# Patient Record
Sex: Male | Born: 1945 | Race: White | Hispanic: No | Marital: Married | Smoking: Former smoker
Health system: Southern US, Community
[De-identification: ages and names within clinical notes are randomized; demographics above are authoritative.]

## PROBLEM LIST (undated history)

## (undated) DIAGNOSIS — E7800 Pure hypercholesterolemia, unspecified: Secondary | ICD-10-CM

## (undated) DIAGNOSIS — I10 Essential (primary) hypertension: Secondary | ICD-10-CM

## (undated) DIAGNOSIS — F17200 Nicotine dependence, unspecified, uncomplicated: Secondary | ICD-10-CM

## (undated) DIAGNOSIS — J449 Chronic obstructive pulmonary disease, unspecified: Secondary | ICD-10-CM

## (undated) DIAGNOSIS — C4492 Squamous cell carcinoma of skin, unspecified: Secondary | ICD-10-CM

## (undated) DIAGNOSIS — I2510 Atherosclerotic heart disease of native coronary artery without angina pectoris: Secondary | ICD-10-CM

## (undated) HISTORY — DX: Atherosclerotic heart disease of native coronary artery without angina pectoris: I25.10

---
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
## 2000-08-04 ENCOUNTER — Ambulatory Visit (HOSPITAL_COMMUNITY): Admission: RE | Admit: 2000-08-04 | Discharge: 2000-08-04 | Payer: Self-pay | Admitting: Pulmonary Disease

## 2004-02-11 ENCOUNTER — Ambulatory Visit: Payer: Self-pay | Admitting: Internal Medicine

## 2004-02-13 ENCOUNTER — Ambulatory Visit (HOSPITAL_COMMUNITY): Admission: RE | Admit: 2004-02-13 | Discharge: 2004-02-13 | Payer: Self-pay | Admitting: Internal Medicine

## 2004-02-13 HISTORY — PX: COLONOSCOPY: SHX174

## 2006-05-13 ENCOUNTER — Ambulatory Visit (HOSPITAL_COMMUNITY): Admission: RE | Admit: 2006-05-13 | Discharge: 2006-05-13 | Payer: Self-pay | Admitting: Pulmonary Disease

## 2007-05-25 ENCOUNTER — Ambulatory Visit (HOSPITAL_COMMUNITY): Admission: RE | Admit: 2007-05-25 | Discharge: 2007-05-25 | Payer: Self-pay | Admitting: Pulmonary Disease

## 2007-06-02 ENCOUNTER — Ambulatory Visit (HOSPITAL_COMMUNITY): Admission: RE | Admit: 2007-06-02 | Discharge: 2007-06-02 | Payer: Self-pay | Admitting: Certified Registered"

## 2009-07-10 ENCOUNTER — Ambulatory Visit (HOSPITAL_COMMUNITY): Admission: RE | Admit: 2009-07-10 | Discharge: 2009-07-10 | Payer: Self-pay | Admitting: Pulmonary Disease

## 2010-08-29 NOTE — Op Note (Signed)
NAME:  Thomas Mosley, Thomas Mosley                ACCOUNT NO.:  [id number]   MEDICAL RECORD NO.:  [id number]          PATIENT TYPE:  AMB   LOCATION:  DAY                           FACILITY:  [facility]   PHYSICIAN:  R. Roetta Sessions, M.D. DATE OF BIRTH:  1945/12/05   DATE OF PROCEDURE:  02/13/2004  DATE OF DISCHARGE:                                 OPERATIVE REPORT   PROCEDURE:  Colonoscopy and snare polypectomy.   INDICATIONS FOR PROCEDURE:  The patient is a 65 year old gentleman devoid of  any GI tract symptoms, referred at the courtesy of Dr. Juanetta Gosling for  colorectal cancer screening. No prior colonoscopy. No family history of  colorectal neoplasia. Colonoscopy is now being done as a screening maneuver.  This approach has been discussed with the patient at length. Potential  risks, benefits, and alternatives have been reviewed and questions answered.  He is agreeable. Please see documentation in the medical note for more  information.   PROCEDURE NOTE:  O2 saturation, blood pressure, pulse, and respirations  monitored throughout the entirety of the procedure. Conscious sedation with  Versed 3 mg IV and Demerol 75 mg IV in divided doses.   INSTRUMENT:  Olympus video chip system.   FINDINGS:  Digital rectal examination revealed no abnormalities.   ENDOSCOPIC FINDINGS:  Prep was good.   Rectum:  Examination of the rectal mucosa including retroflexed view of anal  verge revealed no abnormalities.   Colon:  Colonic mucosa was surveyed from the rectosigmoid junction through  the left, transverse, and right colon to area of the appendiceal orifice,  ileocecal valve, and cecum. These structures were well seen and photographed  for the record. Olympus video scope was slowly withdrawn, and all previously  mentioned mucosal surfaces were again seen. The patient has a 1-cm _angry-  appeaing  pedunculated polyp at 30 cm. It was removed with snare cautery,  and it was recovered with a scope. Remainder of  colonic mucosa appeared  normal. The patient tolerated the procedure well and was reactive to  endoscopy.   IMPRESSION:  1.  Normal rectum.  2.  Pedunculated polyp at 30 cm resected with a snare. Remainder of colonic      mucosa appeared normal.   RECOMMENDATIONS:  1.  No aspirin or arthritis medications for 10 days.  2.  Follow up on pathology.  3.  Further recommendations to follow.     Otelia Sergeant   RMR/MEDQ  D:  02/13/2004  T:  02/13/2004  Job:  [id number]   cc:   Ramon Dredge L. Juanetta Gosling, M.D.  [address]  [place]  [place] [address]  Fax: [phone]

## 2011-01-02 LAB — BASIC METABOLIC PANEL
BUN: 19
CO2: 33 — ABNORMAL HIGH
Calcium: 9.6
Chloride: 100
Creatinine, Ser: 0.85
GFR calc Af Amer: >60
GFR calc non Af Amer: >60
Glucose, Bld: 86
Potassium: 5
Sodium: 140

## 2011-01-02 LAB — HEMOGLOBIN AND HEMATOCRIT, BLOOD
HCT: 41.1
Hemoglobin: 14.6

## 2011-01-27 ENCOUNTER — Encounter (HOSPITAL_COMMUNITY): Payer: Self-pay

## 2011-01-27 ENCOUNTER — Other Ambulatory Visit: Payer: Self-pay

## 2011-01-27 ENCOUNTER — Encounter (HOSPITAL_COMMUNITY)
Admission: RE | Admit: 2011-01-27 | Discharge: 2011-01-27 | Disposition: A | Discharge status: Home / Self Care | Payer: 59 | Source: Ambulatory Visit | Attending: Ophthalmology | Admitting: Ophthalmology

## 2011-01-27 HISTORY — DX: Essential (primary) hypertension: I10

## 2011-01-27 HISTORY — DX: Chronic obstructive pulmonary disease, unspecified: J44.9

## 2011-01-27 HISTORY — DX: Pure hypercholesterolemia, unspecified: E78.00

## 2011-01-27 LAB — CBC
HCT: 42.3 % (ref 39.0–52.0)
Hemoglobin: 14.2 g/dL (ref 13.0–17.0)
MCHC: 33.6 g/dL (ref 30.0–36.0)
MCV: 92.2 fL (ref 78.0–100.0)
RDW: 13.3 % (ref 11.5–15.5)

## 2011-01-27 LAB — BASIC METABOLIC PANEL
BUN: 18 mg/dL (ref 6–23)
Chloride: 101 mEq/L (ref 96–112)
Creatinine, Ser: 0.8 mg/dL (ref 0.50–1.35)
GFR calc Af Amer: >90 mL/min (ref >90)
Glucose, Bld: 96 mg/dL (ref 70–99)
Potassium: 4.9 mEq/L (ref 3.5–5.1)

## 2011-01-27 NOTE — Patient Instructions (Addendum)
20 Thomas Mosley  01/27/2011   Your procedure is scheduled on:  Monday, 02/02/11  Report to [facility] at 12:00 PM.  Call this number if you have problems the morning of surgery: [phone]   Remember:   Do not eat food:After Midnight.  Do not drink clear liquids: After Midnight.  Take these medicines the morning of surgery with A SIP OF WATER: cardiazem    Do not wear jewelry, make-up or nail polish.  Do not wear lotions, powders, or perfumes. You may wear deodorant.  Do not bring valuables to the hospital.  Contacts, dentures or bridgework may not be worn into surgery.    Patients discharged the day of surgery will not be allowed to drive home.  Name and phone number of your driver: driver  Special Instructions: Use eye drops as instructed.   Please read over the following fact sheets that you were given: Anesthesia Post-op Instructions   Monitored Anesthesia Care (MAC)  MAC stands for monitored anesthesia care. MAC usually means a tube is not put in your trachea (windpipe). MAC may also be called moderate sedation. MAC usually involves giving intravenous anesthetic drugs, oxygen, watching vital signs and standard patient monitoring procedures similar to those used during a general anesthetic. MAC can be done without going to the operating room. MAC is typically used for small procedures that cannot be done with only local anesthesia. MAC usually means lower doses of anesthetic drugs. The recovery period tends to be shorter. The drugs used cause a lower level of awareness. This means you are partially awake and your reflexes are intact. You may hear what is being said and feel some pressure, but should not feel pain. The drugs used may affect your ability to remember the procedure. If you have depressed consciousness and lose some protective reflexes, this is called deep sedation. If you become unconscious and fall completely asleep, this is general anesthesia. In both deep sedation and  general anesthesia, the caregivers must make sure that your airway remains open. During MAC, the sedation-trained caregivers will:  Give medications which may include:   Sedatives.   Analgesics.   Hypnotics.   Other medications which are needed to keep you comfortable, safe and secure.   Give local anesthetic to numb the procedural site.   Monitor your level of consciousness.   Monitor your blood pressure.   Monitor your heart rate and rhythm.   Monitor your respirations and oxygen levels.   Monitor your airway.   Monitor your level of pain.   Evaluate and treat problems which may occur.  Some of this information is from the [employer]. Document Released: 12/24/2004 Document Re-Released: 04/21/2009 [facility] Patient Information 2011 [place], [place].

## 2011-02-02 ENCOUNTER — Encounter (HOSPITAL_COMMUNITY)
Admission: RE | Disposition: A | Discharge status: Home / Self Care | Payer: Self-pay | Source: Ambulatory Visit | Attending: Ophthalmology

## 2011-02-02 ENCOUNTER — Encounter (HOSPITAL_COMMUNITY): Payer: Self-pay

## 2011-02-02 ENCOUNTER — Ambulatory Visit (HOSPITAL_COMMUNITY): Payer: 59 | Admitting: Anesthesiology

## 2011-02-02 ENCOUNTER — Encounter (HOSPITAL_COMMUNITY): Payer: Self-pay | Admitting: Anesthesiology

## 2011-02-02 ENCOUNTER — Ambulatory Visit (HOSPITAL_COMMUNITY)
Admission: RE | Admit: 2011-02-02 | Discharge: 2011-02-02 | Disposition: A | Discharge status: Home / Self Care | Payer: 59 | Source: Ambulatory Visit | Attending: Ophthalmology | Admitting: Ophthalmology

## 2011-02-02 DIAGNOSIS — E7800 Pure hypercholesterolemia, unspecified: Secondary | ICD-10-CM | POA: Insufficient documentation

## 2011-02-02 DIAGNOSIS — I10 Essential (primary) hypertension: Secondary | ICD-10-CM | POA: Insufficient documentation

## 2011-02-02 DIAGNOSIS — H2589 Other age-related cataract: Secondary | ICD-10-CM | POA: Insufficient documentation

## 2011-02-02 DIAGNOSIS — Z79899 Other long term (current) drug therapy: Secondary | ICD-10-CM | POA: Insufficient documentation

## 2011-02-02 DIAGNOSIS — J4489 Other specified chronic obstructive pulmonary disease: Secondary | ICD-10-CM | POA: Insufficient documentation

## 2011-02-02 DIAGNOSIS — Z01812 Encounter for preprocedural laboratory examination: Secondary | ICD-10-CM | POA: Insufficient documentation

## 2011-02-02 DIAGNOSIS — Z01810 Encounter for preprocedural cardiovascular examination: Secondary | ICD-10-CM | POA: Insufficient documentation

## 2011-02-02 DIAGNOSIS — J449 Chronic obstructive pulmonary disease, unspecified: Secondary | ICD-10-CM | POA: Insufficient documentation

## 2011-02-02 HISTORY — PX: CATARACT EXTRACTION W/PHACO: SHX586

## 2011-02-02 SURGERY — PHACOEMULSIFICATION, CATARACT, WITH IOL INSERTION
Anesthesia: Monitor Anesthesia Care | Site: Eye | Laterality: Left | Wound class: Clean

## 2011-02-02 MED ORDER — NEOMYCIN-POLYMYXIN-DEXAMETH 0.1 % OP OINT
TOPICAL_OINTMENT | OPHTHALMIC | Status: DC | PRN
  Administered 2011-02-02: 1 via OPHTHALMIC

## 2011-02-02 MED ORDER — LIDOCAINE HCL 3.5 % OP GEL: 1.0000 "application " | Freq: Once | OPHTHALMIC | Status: DC

## 2011-02-02 MED ORDER — TETRACAINE HCL 0.5 % OP SOLN
1.0000 [drp] | OPHTHALMIC | Status: AC
  Administered 2011-02-02 (×3): 1 [drp] via OPHTHALMIC

## 2011-02-02 MED ORDER — MIDAZOLAM HCL 2 MG/2ML IJ SOLN
1.0000 mg | INTRAMUSCULAR | Status: DC | PRN
  Administered 2011-02-02: 2 mg via INTRAVENOUS

## 2011-02-02 MED ORDER — LIDOCAINE HCL 3.5 % OP GEL
OPHTHALMIC | Status: AC
  Filled 2011-02-02: qty 5

## 2011-02-02 MED ORDER — LIDOCAINE HCL (PF) 1 % IJ SOLN
INTRAMUSCULAR | Status: DC | PRN
  Administered 2011-02-02: .6 mL

## 2011-02-02 MED ORDER — EPINEPHRINE HCL 1 MG/ML IJ SOLN
INTRAMUSCULAR | Status: AC
  Filled 2011-02-02: qty 1

## 2011-02-02 MED ORDER — LIDOCAINE HCL (PF) 1 % IJ SOLN
INTRAMUSCULAR | Status: AC
  Filled 2011-02-02: qty 2

## 2011-02-02 MED ORDER — CYCLOPENTOLATE-PHENYLEPHRINE 0.2-1 % OP SOLN
1.0000 [drp] | OPHTHALMIC | Status: AC
  Administered 2011-02-02 (×3): 1 [drp] via OPHTHALMIC

## 2011-02-02 MED ORDER — BSS IO SOLN
INTRAOCULAR | Status: DC | PRN
  Administered 2011-02-02: 15 mL via OPHTHALMIC

## 2011-02-02 MED ORDER — PHENYLEPHRINE HCL 2.5 % OP SOLN
OPHTHALMIC | Status: AC
  Administered 2011-02-02: 1 [drp] via OPHTHALMIC
  Filled 2011-02-02: qty 2

## 2011-02-02 MED ORDER — TETRACAINE HCL 0.5 % OP SOLN
OPHTHALMIC | Status: AC
  Administered 2011-02-02: 1 [drp] via OPHTHALMIC
  Filled 2011-02-02: qty 2

## 2011-02-02 MED ORDER — LIDOCAINE 3.5 % OP GEL OPTIME - NO CHARGE
OPHTHALMIC | Status: DC | PRN
  Administered 2011-02-02: 1 [drp] via OPHTHALMIC

## 2011-02-02 MED ORDER — LACTATED RINGERS IV SOLN
INTRAVENOUS | Status: DC
  Administered 2011-02-02: 14:00:00 via INTRAVENOUS

## 2011-02-02 MED ORDER — PROVISC 10 MG/ML IO SOLN
INTRAOCULAR | Status: DC | PRN
  Administered 2011-02-02: 8.5 mg via OPHTHALMIC

## 2011-02-02 MED ORDER — POVIDONE-IODINE 5 % OP SOLN
OPHTHALMIC | Status: DC | PRN
  Administered 2011-02-02: 1 via OPHTHALMIC

## 2011-02-02 MED ORDER — NEOMYCIN-POLYMYXIN-DEXAMETH 3.5-10000-0.1 OP OINT
TOPICAL_OINTMENT | OPHTHALMIC | Status: AC
  Filled 2011-02-02: qty 3.5

## 2011-02-02 MED ORDER — PHENYLEPHRINE HCL 2.5 % OP SOLN
1.0000 [drp] | OPHTHALMIC | Status: AC
  Administered 2011-02-02 (×3): 1 [drp] via OPHTHALMIC

## 2011-02-02 MED ORDER — BSS IO SOLN
INTRAOCULAR | Status: DC | PRN
  Administered 2011-02-02: 14:00:00

## 2011-02-02 MED ORDER — MIDAZOLAM HCL 2 MG/2ML IJ SOLN
INTRAMUSCULAR | Status: AC
  Administered 2011-02-02: 2 mg via INTRAVENOUS
  Filled 2011-02-02: qty 2

## 2011-02-02 MED ORDER — CYCLOPENTOLATE-PHENYLEPHRINE 0.2-1 % OP SOLN
OPHTHALMIC | Status: AC
  Administered 2011-02-02: 1 [drp] via OPHTHALMIC
  Filled 2011-02-02: qty 2

## 2011-02-02 SURGICAL SUPPLY — 34 items
CAPSULAR TENSION RING-AMO (OPHTHALMIC RELATED) IMPLANT
CLOTH BEACON ORANGE TIMEOUT ST (SAFETY) ×1 IMPLANT
DUOVISC SYSTEM (INTRAOCULAR LENS)
EYE SHIELD UNIVERSAL CLEAR (GAUZE/BANDAGES/DRESSINGS) ×1 IMPLANT
GLOVE BIO SURGEON STRL SZ 6.5 (GLOVE) IMPLANT
GLOVE BIOGEL PI IND STRL 6.5 (GLOVE) IMPLANT
GLOVE BIOGEL PI IND STRL 7.0 (GLOVE) IMPLANT
GLOVE BIOGEL PI IND STRL 7.5 (GLOVE) IMPLANT
GLOVE BIOGEL PI INDICATOR 6.5 (GLOVE)
GLOVE BIOGEL PI INDICATOR 7.0 (GLOVE) ×1
GLOVE BIOGEL PI INDICATOR 7.5 (GLOVE)
GLOVE ECLIPSE 6.5 STRL STRAW (GLOVE) IMPLANT
GLOVE ECLIPSE 7.0 STRL STRAW (GLOVE) IMPLANT
GLOVE ECLIPSE 7.5 STRL STRAW (GLOVE) IMPLANT
GLOVE EXAM NITRILE LRG STRL (GLOVE) ×1 IMPLANT
GLOVE EXAM NITRILE MD LF STRL (GLOVE) IMPLANT
GLOVE SKINSENSE NS SZ6.5 (GLOVE)
GLOVE SKINSENSE NS SZ7.0 (GLOVE)
GLOVE SKINSENSE STRL SZ6.5 (GLOVE) IMPLANT
GLOVE SKINSENSE STRL SZ7.0 (GLOVE) IMPLANT
KIT VITRECTOMY (OPHTHALMIC RELATED) IMPLANT
PAD ARMBOARD 7.5X6 YLW CONV (MISCELLANEOUS) ×1 IMPLANT
PROC W NO LENS (INTRAOCULAR LENS)
PROC W SPEC LENS (INTRAOCULAR LENS)
PROCESS W NO LENS (INTRAOCULAR LENS) IMPLANT
PROCESS W SPEC LENS (INTRAOCULAR LENS) IMPLANT
RING MALYGIN (MISCELLANEOUS) IMPLANT
SIGHTPATH CAT PROC W REG LENS (Ophthalmic Related) ×2 IMPLANT
SYR TB 1ML LL NO SAFETY (SYRINGE) ×1 IMPLANT
SYSTEM DUOVISC (INTRAOCULAR LENS) IMPLANT
TAPE SURG TRANSPORE 1 IN (GAUZE/BANDAGES/DRESSINGS) IMPLANT
TAPE SURGICAL TRANSPORE 1 IN (GAUZE/BANDAGES/DRESSINGS) ×1
VISCOELASTIC ADDITIONAL (OPHTHALMIC RELATED) IMPLANT
WATER STERILE IRR 250ML POUR (IV SOLUTION) ×1 IMPLANT

## 2011-02-02 NOTE — H&P (Signed)
I have reviewed the H&P, the patient was re-examined, and I have identified no interval changes in medical condition and plan of care since the history and physical of record  

## 2011-02-02 NOTE — Anesthesia Preprocedure Evaluation (Signed)
Anesthesia Evaluation  Patient identified by MRN, date of birth, ID band Patient awake  General Assessment Comment  Reviewed: Allergy & Precautions, H&P , NPO status , Patient's Chart, lab work & pertinent test results  History of Anesthesia Complications Negative for: history of anesthetic complications  Airway Mallampati: II      Dental  (+) Partial Upper and Partial Lower   Pulmonary (+) shortness of breath and With exertion COPD   Pulmonary exam normal       Cardiovascular hypertension, Pt. on medications Regular Normal    Neuro/Psych    GI/Hepatic   Endo/Other    Renal/GU      Musculoskeletal   Abdominal   Peds  Hematology   Anesthesia Other Findings   Reproductive/Obstetrics                           Anesthesia Physical Anesthesia Plan  ASA: III  Anesthesia Plan: MAC   Post-op Pain Management:    Induction:   Airway Management Planned: Nasal Cannula  Additional Equipment:   Intra-op Plan:   Post-operative Plan:   Informed Consent: I have reviewed the patients History and Physical, chart, labs and discussed the procedure including the risks, benefits and alternatives for the proposed anesthesia with the patient or authorized representative who has indicated his/her understanding and acceptance.     Plan Discussed with:   Anesthesia Plan Comments:         Anesthesia Quick Evaluation

## 2011-02-02 NOTE — Transfer of Care (Signed)
Immediate Anesthesia Transfer of Care Note  Patient: Thomas Mosley  Procedure(s) Performed:  CATARACT EXTRACTION PHACO AND INTRAOCULAR LENS PLACEMENT (IOC) - CDE 12.33  Patient Location: PACU and Short Stay  Anesthesia Type: MAC  Level of Consciousness: awake  Airway & Oxygen Therapy: Patient Spontanous Breathing  Post-op Assessment: Report given to PACU RN  Post vital signs: Reviewed and stable  Complications: No apparent anesthesia complications

## 2011-02-02 NOTE — Anesthesia Postprocedure Evaluation (Signed)
  Anesthesia Post-op Note  Patient: Thomas Mosley  Procedure(s) Performed:  CATARACT EXTRACTION PHACO AND INTRAOCULAR LENS PLACEMENT (IOC) - CDE 12.33  Patient Location: PACU and Short Stay  Anesthesia Type: MAC  Level of Consciousness: awake  Airway and Oxygen Therapy: Patient Spontanous Breathing  Post-op Pain: none  Post-op Assessment: Post-op Vital signs reviewed  Post-op Vital Signs: Reviewed and stable  Complications: No apparent anesthesia complications

## 2011-02-02 NOTE — Brief Op Note (Signed)
Pre-Op Dx: Cataract OS Post-Op Dx: Cataract OS Surgeon: Kathlee Barnhardt Anesthesia: Topical with MAC Implant: Alcon Acrysof [id number] Specimen: None Complications: None 

## 2011-02-03 NOTE — Op Note (Signed)
NAME:  CATALDO, COSGRIFF NO.:  [id number]  MEDICAL RECORD NO.:  [id number]  LOCATION:  [facility]                          FACILITY:  [facility]  PHYSICIAN:  Susanne Greenhouse, MD       DATE OF BIRTH:  03/04/1946  DATE OF PROCEDURE:  02/02/2011 DATE OF DISCHARGE:  02/02/2011                              OPERATIVE REPORT   PREOPERATIVE DIAGNOSIS:  Combined cataract, left eye.  Diagnosis code 366.19.  POSTOPERATIVE DIAGNOSIS:  Combined cataract, left eye.  Diagnosis code 366.19.  SURGEON:  Bonne Dolores. Khye Hochstetler, MD  ANESTHESIA:  Topical with monitored anesthesia care.  DESCRIPTION OF OPERATION:  In the preoperative holding area, dilating drops and viscous lidocaine were placed into the left eye.  The patient was then brought to the operating room where he was prepped and draped. Beginning with a 75 blade, a paracentesis port was made at the surgeon's 2 o'clock position.  The anterior chamber was then filled with a 1% nonpreserved lidocaine solution.  This was followed by instilling Provisc into the anterior chamber through the paracentesis port.  A 2.4 mm keratome blade was then used to make a clear corneal incision at the temporal limbus.  A bent cystotome needle was used to create a continuous tear capsulotomy.  Hydrodissection was performed with balanced salt solution on a fine cannula.  Lens nucleus was then removed using phacoemulsification in a quadrant cracking technique.  Residual cortex was removed with irrigation and aspiration.  The capsular bag and anterior chamber were refilled with Provisc, and the poster chamber interocular was placed in the capsular bag without difficulty using its lens injecting system.  The Provisc was then removed from the capsular bag and anterior chamber with irrigation and aspiration.  Stromal hydration of the main incision and paracentesis ports was performed with balanced salt solution on a fine cannula.  The wounds were tested for leak which  were negative.  The patient tolerated procedure well.  There were no operative complications, and he was returned to recovery area in satisfactory condition.  There were no surgical specimens.  Prosthetic device used is a Alcon AcrySof posterior chamber lens, model SN60WF, power of 22.0, serial number is [id number].          ______________________________ Susanne Greenhouse, MD     KEH/MEDQ  D:  02/02/2011  T:  02/03/2011  Job:  [id number]

## 2011-02-06 ENCOUNTER — Encounter (HOSPITAL_COMMUNITY): Payer: Self-pay | Admitting: Ophthalmology

## 2011-06-12 DIAGNOSIS — C4492 Squamous cell carcinoma of skin, unspecified: Secondary | ICD-10-CM

## 2011-06-12 HISTORY — DX: Squamous cell carcinoma of skin, unspecified: C44.92

## 2012-10-13 ENCOUNTER — Encounter (INDEPENDENT_AMBULATORY_CARE_PROVIDER_SITE_OTHER): Payer: Self-pay | Admitting: *Deleted

## 2012-10-27 ENCOUNTER — Encounter: Payer: Self-pay | Admitting: Internal Medicine

## 2012-10-31 ENCOUNTER — Encounter: Payer: Self-pay | Admitting: Gastroenterology

## 2012-10-31 ENCOUNTER — Ambulatory Visit (INDEPENDENT_AMBULATORY_CARE_PROVIDER_SITE_OTHER): Payer: Medicare Other | Admitting: Gastroenterology

## 2012-10-31 VITALS — BP 175/97 | HR 75 | Temp 98.4°F | Ht 73.0 in | Wt 156.6 lb

## 2012-10-31 DIAGNOSIS — Z86010 Personal history of colonic polyps: Secondary | ICD-10-CM

## 2012-10-31 NOTE — Progress Notes (Signed)
Primary Care Physician:  Fredirick Maudlin, MD Primary Gastroenterologist:  Dr. Jena Gauss   Chief Complaint  Patient presents with  . Colonoscopy    HPI:   Thomas Mosley presents today at the request of his PCP due to surveillance colonoscopy. Last procedure in 2005 with pedunculated polyp removed; path unknown at time of visit. No abdominal pain, no rectal bleeding. No constipation or diarrhea; denies any changes in bowel habits. Denies any weight loss or lack of appetite. Denies reflux or dysphagia.   Past Medical History  Diagnosis Date  . Hypertension   . Hypercholesterolemia   . COPD (chronic obstructive pulmonary disease)     Past Surgical History  Procedure Laterality Date  . Cataract extraction w/phaco  02/02/2011    Procedure: CATARACT EXTRACTION PHACO AND INTRAOCULAR LENS PLACEMENT (IOC);  Surgeon: Gemma Payor;  Location: [facility];  Service: Ophthalmology;  Laterality: Left;  CDE 12.33  . Colonoscopy  02/13/2004    RUE:[id number] polyp at 30 cm resected/normal rectum    Current Outpatient Prescriptions  Medication Sig Dispense Refill  . diltiazem (CARDIZEM CD) 180 MG 24 hr capsule Take 180 mg by mouth daily.        . pravastatin (PRAVACHOL) 40 MG tablet Take 80 mg by mouth daily.         No current facility-administered medications for this visit.    Allergies as of 10/31/2012  . (No Known Allergies)    Family History  Problem Relation Age of Onset  . Anesthesia problems Neg Hx   . Hypotension Neg Hx   . Malignant hyperthermia Neg Hx   . Pseudochol deficiency Neg Hx   . Colon cancer Neg Hx     History   Social History  . Marital Status: Married    Spouse Name: N/A    Number of Children: N/A  . Years of Education: N/A   Occupational History  . Not on file.   Social History Main Topics  . Smoking status: Current Every Day Smoker -- 40 years    Types: Cigarettes  . Smokeless tobacco: Not on file     Comment: smokes 1-2 cigarettes per day   . Alcohol Use:  0.0 oz/week     Comment: glass of wine about 3 days a week   . Drug Use: No  . Sexually Active: Yes   Other Topics Concern  . Not on file   Social History Narrative  . No narrative on file    Review of Systems: Gen: see HPI CV: Denies chest pain, heart palpitations, peripheral edema, syncope.  Resp: Denies shortness of breath at rest or with exertion. Denies wheezing or cough.  GI: see HPI  GU : Denies urinary burning, urinary frequency, urinary hesitancy MS: Denies joint pain, muscle weakness, cramps, or limitation of movement.  Derm: Denies rash, itching, dry skin Psych: Denies depression, anxiety, memory loss, and confusion Heme: Denies bruising, bleeding, and enlarged lymph nodes.  Physical Exam: BP 175/97  Pulse 75  Temp(Src) 98.4 F (36.9 C) (Oral)  Ht 6\' 1"  (1.854 m)  Wt 156 lb 9.6 oz (71.033 kg)  BMI 20.67 kg/m2 General:   Alert and oriented. Pleasant and cooperative. Well-nourished and well-developed.  Head:  Normocephalic and atraumatic. Eyes:  Without icterus, sclera clear and conjunctiva pink.  Ears:  Normal auditory acuity. Nose:  No deformity, discharge,  or lesions. Mouth:  No deformity or lesions, oral mucosa pink.  Neck:  Supple, without mass or thyromegaly. Lungs:  Clear to auscultation bilaterally.  No wheezes, rales, or rhonchi. No distress.  Heart:  S1, S2 present without murmurs appreciated.  Abdomen:  +BS, soft, non-tender and non-distended. No HSM noted. No guarding or rebound. No masses appreciated.  Rectal:  Deferred  Msk:  Symmetrical without gross deformities. Normal posture. Extremities:  Without clubbing or edema. Neurologic:  Alert and  oriented x4;  grossly normal neurologically. Skin:  Intact without significant lesions or rashes. Cervical Nodes:  No significant cervical adenopathy. Psych:  Alert and cooperative. Normal mood and affect.

## 2012-10-31 NOTE — Patient Instructions (Addendum)
We have requested the pathology reports from your last colonoscopy. You may not need a colonoscopy until next year. We will let you know as soon as we can.  We will be in touch shortly.

## 2012-11-01 DIAGNOSIS — Z860100 Personal history of colon polyps, unspecified: Secondary | ICD-10-CM | POA: Insufficient documentation

## 2012-11-01 NOTE — Progress Notes (Signed)
Cc PCP 

## 2012-11-01 NOTE — Assessment & Plan Note (Signed)
67 year old male with personal history of a colon polyp noted on last TCS in 2005. At time of visit, path report unavailable. I have been unable to retrieve path report from medical records thus far. He has no lower or upper GI issues currently. No FH of colon cancer.   As patient does have a personal history of polyps, it would be prudent to pursue a colonoscopy now instead of waiting a full 10 years; if polyp was adenomatous, he would be several years overdue. It is reassuring he does not have any concerning symptoms currently.   Proceed with TCS with Dr. Jena Gauss in near future: the risks, benefits, and alternatives have been discussed with the patient in detail. The patient states understanding and desires to proceed.

## 2012-11-01 NOTE — Progress Notes (Signed)
Thomas Mosley, I have been unable to retrieve path notes from [facility] regarding polyp in 2005. To be thorough, let's go ahead and schedule a colonoscopy with Dr. Jena Gauss in the near future. Patient is aware I was looking into this, and he is just waiting to hear from [place] on the next step.

## 2012-11-01 NOTE — Progress Notes (Signed)
I have LMOM for patient to return my call.  

## 2012-11-02 ENCOUNTER — Other Ambulatory Visit: Payer: Self-pay | Admitting: Internal Medicine

## 2012-11-02 DIAGNOSIS — Z86010 Personal history of colonic polyps: Secondary | ICD-10-CM

## 2012-11-02 MED ORDER — PEG 3350-KCL-NA BICARB-NACL 420 G PO SOLR: 4000.0000 mL | ORAL | Status: DC

## 2012-11-02 NOTE — Progress Notes (Signed)
I have patient scheduled for TCS w/RMR on Friday Aug 1st at 12:15 and I have mailed him instructions

## 2012-11-11 ENCOUNTER — Encounter (HOSPITAL_COMMUNITY): Payer: Self-pay

## 2012-11-14 ENCOUNTER — Encounter: Payer: Self-pay | Admitting: Gastroenterology

## 2012-11-14 NOTE — Progress Notes (Signed)
Finally received path reports from [facility]. 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  He has not had a colonoscopy since then. Very important that he keeps this surveillance colonoscopy; he is greatly overdue.

## 2012-11-18 ENCOUNTER — Ambulatory Visit: Payer: 59 | Admitting: Internal Medicine

## 2012-11-23 ENCOUNTER — Ambulatory Visit (HOSPITAL_COMMUNITY)
Admission: RE | Admit: 2012-11-23 | Discharge: 2012-11-23 | Disposition: A | Discharge status: Home / Self Care | Payer: Medicare Other | Source: Ambulatory Visit | Attending: Internal Medicine | Admitting: Internal Medicine

## 2012-11-23 ENCOUNTER — Encounter (HOSPITAL_COMMUNITY): Payer: Self-pay | Admitting: *Deleted

## 2012-11-23 ENCOUNTER — Encounter (HOSPITAL_COMMUNITY)
Admission: RE | Disposition: A | Discharge status: Home / Self Care | Payer: Self-pay | Source: Ambulatory Visit | Attending: Internal Medicine

## 2012-11-23 DIAGNOSIS — E7800 Pure hypercholesterolemia, unspecified: Secondary | ICD-10-CM | POA: Insufficient documentation

## 2012-11-23 DIAGNOSIS — Z860100 Personal history of colon polyps, unspecified: Secondary | ICD-10-CM | POA: Insufficient documentation

## 2012-11-23 DIAGNOSIS — F17200 Nicotine dependence, unspecified, uncomplicated: Secondary | ICD-10-CM | POA: Insufficient documentation

## 2012-11-23 DIAGNOSIS — J449 Chronic obstructive pulmonary disease, unspecified: Secondary | ICD-10-CM | POA: Insufficient documentation

## 2012-11-23 DIAGNOSIS — K648 Other hemorrhoids: Secondary | ICD-10-CM | POA: Insufficient documentation

## 2012-11-23 DIAGNOSIS — I10 Essential (primary) hypertension: Secondary | ICD-10-CM | POA: Insufficient documentation

## 2012-11-23 DIAGNOSIS — Z1211 Encounter for screening for malignant neoplasm of colon: Secondary | ICD-10-CM

## 2012-11-23 DIAGNOSIS — J4489 Other specified chronic obstructive pulmonary disease: Secondary | ICD-10-CM | POA: Insufficient documentation

## 2012-11-23 HISTORY — PX: COLONOSCOPY: SHX5424

## 2012-11-23 SURGERY — COLONOSCOPY
Anesthesia: Moderate Sedation

## 2012-11-23 MED ORDER — MIDAZOLAM HCL 5 MG/5ML IJ SOLN
INTRAMUSCULAR | Status: DC | PRN
  Administered 2012-11-23: 2 mg via INTRAVENOUS
  Administered 2012-11-23: 1 mg via INTRAVENOUS

## 2012-11-23 MED ORDER — ONDANSETRON HCL 4 MG/2ML IJ SOLN
INTRAMUSCULAR | Status: AC
  Filled 2012-11-23: qty 2

## 2012-11-23 MED ORDER — MEPERIDINE HCL 100 MG/ML IJ SOLN
INTRAMUSCULAR | Status: AC
  Filled 2012-11-23: qty 2

## 2012-11-23 MED ORDER — SODIUM CHLORIDE 0.9 % IV SOLN
INTRAVENOUS | Status: DC
  Administered 2012-11-23: 10:00:00 via INTRAVENOUS

## 2012-11-23 MED ORDER — STERILE WATER FOR IRRIGATION IR SOLN
Status: DC | PRN
  Administered 2012-11-23: 11:00:00

## 2012-11-23 MED ORDER — MEPERIDINE HCL 100 MG/ML IJ SOLN
INTRAMUSCULAR | Status: DC | PRN
  Administered 2012-11-23: 25 mg via INTRAVENOUS
  Administered 2012-11-23: 50 mg

## 2012-11-23 MED ORDER — MIDAZOLAM HCL 5 MG/5ML IJ SOLN
INTRAMUSCULAR | Status: AC
  Filled 2012-11-23: qty 10

## 2012-11-23 MED ORDER — ONDANSETRON HCL 4 MG/2ML IJ SOLN
INTRAMUSCULAR | Status: DC | PRN
  Administered 2012-11-23: 4 mg via INTRAVENOUS

## 2012-11-23 NOTE — H&P (View-Only) (Signed)
 Primary Care Physician:  HAWKINS,EDWARD L, MD Primary Gastroenterologist:  Dr. Rourk   Chief Complaint  Patient presents with  . Colonoscopy    HPI:   Thomas Mosley presents today at the request of his PCP due to surveillance colonoscopy. Last procedure in 2005 with pedunculated polyp removed; path unknown at time of visit. No abdominal pain, no rectal bleeding. No constipation or diarrhea; denies any changes in bowel habits. Denies any weight loss or lack of appetite. Denies reflux or dysphagia.   Past Medical History  Diagnosis Date  . Hypertension   . Hypercholesterolemia   . COPD (chronic obstructive pulmonary disease)     Past Surgical History  Procedure Laterality Date  . Cataract extraction w/phaco  02/02/2011    Procedure: CATARACT EXTRACTION PHACO AND INTRAOCULAR LENS PLACEMENT (IOC);  Surgeon: Kerry Hunt;  Location: [facility];  Service: Ophthalmology;  Laterality: Left;  CDE 12.33  . Colonoscopy  02/13/2004    RMR:Pedunculated polyp at 30 cm resected/normal rectum    Current Outpatient Prescriptions  Medication Sig Dispense Refill  . diltiazem (CARDIZEM CD) 180 MG 24 hr capsule Take 180 mg by mouth daily.        . pravastatin (PRAVACHOL) 40 MG tablet Take 80 mg by mouth daily.         No current facility-administered medications for this visit.    Allergies as of 10/31/2012  . (No Known Allergies)    Family History  Problem Relation Age of Onset  . Anesthesia problems Neg Hx   . Hypotension Neg Hx   . Malignant hyperthermia Neg Hx   . Pseudochol deficiency Neg Hx   . Colon cancer Neg Hx     History   Social History  . Marital Status: Married    Spouse Name: N/A    Number of Children: N/A  . Years of Education: N/A   Occupational History  . Not on file.   Social History Main Topics  . Smoking status: Current Every Day Smoker -- 40 years    Types: Cigarettes  . Smokeless tobacco: Not on file     Comment: smokes 1-2 cigarettes per day   . Alcohol Use:  0.0 oz/week     Comment: glass of wine about 3 days a week   . Drug Use: No  . Sexually Active: Yes   Other Topics Concern  . Not on file   Social History Narrative  . No narrative on file    Review of Systems: Gen: see HPI CV: Denies chest pain, heart palpitations, peripheral edema, syncope.  Resp: Denies shortness of breath at rest or with exertion. Denies wheezing or cough.  GI: see HPI  GU : Denies urinary burning, urinary frequency, urinary hesitancy MS: Denies joint pain, muscle weakness, cramps, or limitation of movement.  Derm: Denies rash, itching, dry skin Psych: Denies depression, anxiety, memory loss, and confusion Heme: Denies bruising, bleeding, and enlarged lymph nodes.  Physical Exam: BP 175/97  Pulse 75  Temp(Src) 98.4 F (36.9 C) (Oral)  Ht 6' 1" (1.854 m)  Wt 156 lb 9.6 oz (71.033 kg)  BMI 20.67 kg/m2 General:   Alert and oriented. Pleasant and cooperative. Well-nourished and well-developed.  Head:  Normocephalic and atraumatic. Eyes:  Without icterus, sclera clear and conjunctiva pink.  Ears:  Normal auditory acuity. Nose:  No deformity, discharge,  or lesions. Mouth:  No deformity or lesions, oral mucosa pink.  Neck:  Supple, without mass or thyromegaly. Lungs:  Clear to auscultation bilaterally.   No wheezes, rales, or rhonchi. No distress.  Heart:  S1, S2 present without murmurs appreciated.  Abdomen:  +BS, soft, non-tender and non-distended. No HSM noted. No guarding or rebound. No masses appreciated.  Rectal:  Deferred  Msk:  Symmetrical without gross deformities. Normal posture. Extremities:  Without clubbing or edema. Neurologic:  Alert and  oriented x4;  grossly normal neurologically. Skin:  Intact without significant lesions or rashes. Cervical Nodes:  No significant cervical adenopathy. Psych:  Alert and cooperative. Normal mood and affect.    

## 2012-11-23 NOTE — Op Note (Signed)
[facility] [address] [place], [address]   COLONOSCOPY PROCEDURE REPORT  PATIENT: Thomas Mosley, Thomas Mosley  MR#:         [id number] BIRTHDATE: [birth year] , 66  yrs. old GENDER: Male ENDOSCOPIST: R.  Roetta Sessions, MD FACP FACG REFERRED BY:  Kari Baars, M.D. PROCEDURE DATE:  11/23/2012 PROCEDURE:     Surveillance ileocolonoscopy  INDICATIONS: history of high-grade adenoma  INFORMED CONSENT:  The risks, benefits, alternatives and imponderables including but not limited to bleeding, perforation as well as the possibility of a missed lesion have been reviewed.  The potential for biopsy, lesion removal, etc. have also been discussed.  Questions have been answered.  All parties agreeable. Please see the history and physical in the medical record for more information.  MEDICATIONS: Versed 3 mg IV and Demerol 75 mg IV. Zofran 4 mg IV  DESCRIPTION OF PROCEDURE:  After a digital rectal exam was performed, the EC-[id number] ([id number])  colonoscope was advanced from the anus through the rectum and colon to the area of the cecum, ileocecal valve and appendiceal orifice.  The cecum was deeply intubated.  These structures were well-seen and photographed for the record.  From the level of the cecum and ileocecal valve, the scope was slowly and cautiously withdrawn.  The mucosal surfaces were carefully surveyed utilizing scope tip deflection to facilitate fold flattening as needed.  The scope was pulled down into the rectum where a thorough examination including retroflexion was performed.    FINDINGS:  Adequate preparation. Normal rectum aside from internal hemorrhoids. Normal-appearing colonic mucosa. The distal 15 cm of terminal ileum mucosa also appeared normal.  THERAPEUTIC / DIAGNOSTIC MANEUVERS PERFORMED:  None  COMPLICATIONS: None  CECAL WITHDRAWAL TIME:  10 minutes  IMPRESSION:  Normal colonoscopy (internal hemorrhoids).  RECOMMENDATIONS: Repeat colonoscopy in 5  years  _______________________________ eSigned:  R. Roetta Sessions, MD FACP [facility] 11/23/2012 11:31 AM   CC:    PATIENT NAME:  Lion, Fernandez MR#: [id number]

## 2012-11-23 NOTE — Interval H&P Note (Signed)
History and Physical Interval Note:  11/23/2012 10:54 AM  Horace Porteous  has presented today for surgery, with the diagnosis of HISTORY OF COLON POLYPS  The various methods of treatment have been discussed with the patient and family. After consideration of risks, benefits and other options for treatment, the patient has consented to  Procedure(s) with comments: COLONOSCOPY (N/A) - 12:15-rescheduled to 11:00am Soledad Gerlach notified pt as a surgical intervention .  The patient's history has been reviewed, patient examined, no change in status, stable for surgery.  I have reviewed the patient's chart and labs.  Questions were answered to the patient's satisfaction.     Leighton Brickley  No change. Overdue for surveillance colonoscopy. Surveillance colonoscopy today per plan.The risks, benefits, limitations, alternatives and imponderables have been reviewed with the patient. Questions have been answered. All parties are agreeable.

## 2012-11-25 ENCOUNTER — Encounter (HOSPITAL_COMMUNITY): Payer: Self-pay | Admitting: Internal Medicine

## 2013-01-14 ENCOUNTER — Encounter (HOSPITAL_COMMUNITY): Payer: Self-pay

## 2013-01-14 ENCOUNTER — Emergency Department (HOSPITAL_COMMUNITY)
Admission: EM | Admit: 2013-01-14 | Discharge: 2013-01-15 | Disposition: A | Discharge status: Home / Self Care | Payer: Medicare Other | Attending: Emergency Medicine | Admitting: Emergency Medicine

## 2013-01-14 DIAGNOSIS — S0990XA Unspecified injury of head, initial encounter: Secondary | ICD-10-CM

## 2013-01-14 DIAGNOSIS — E7800 Pure hypercholesterolemia, unspecified: Secondary | ICD-10-CM | POA: Insufficient documentation

## 2013-01-14 DIAGNOSIS — Y92009 Unspecified place in unspecified non-institutional (private) residence as the place of occurrence of the external cause: Secondary | ICD-10-CM | POA: Insufficient documentation

## 2013-01-14 DIAGNOSIS — S0101XA Laceration without foreign body of scalp, initial encounter: Secondary | ICD-10-CM

## 2013-01-14 DIAGNOSIS — Y9389 Activity, other specified: Secondary | ICD-10-CM | POA: Insufficient documentation

## 2013-01-14 DIAGNOSIS — S066X0A Traumatic subarachnoid hemorrhage without loss of consciousness, initial encounter: Secondary | ICD-10-CM | POA: Insufficient documentation

## 2013-01-14 DIAGNOSIS — F17200 Nicotine dependence, unspecified, uncomplicated: Secondary | ICD-10-CM | POA: Insufficient documentation

## 2013-01-14 DIAGNOSIS — J449 Chronic obstructive pulmonary disease, unspecified: Secondary | ICD-10-CM | POA: Insufficient documentation

## 2013-01-14 DIAGNOSIS — S0100XA Unspecified open wound of scalp, initial encounter: Secondary | ICD-10-CM | POA: Insufficient documentation

## 2013-01-14 DIAGNOSIS — Z79899 Other long term (current) drug therapy: Secondary | ICD-10-CM | POA: Insufficient documentation

## 2013-01-14 DIAGNOSIS — S066X9A Traumatic subarachnoid hemorrhage with loss of consciousness of unspecified duration, initial encounter: Secondary | ICD-10-CM

## 2013-01-14 DIAGNOSIS — I10 Essential (primary) hypertension: Secondary | ICD-10-CM | POA: Insufficient documentation

## 2013-01-14 DIAGNOSIS — J4489 Other specified chronic obstructive pulmonary disease: Secondary | ICD-10-CM | POA: Insufficient documentation

## 2013-01-14 DIAGNOSIS — W19XXXA Unspecified fall, initial encounter: Secondary | ICD-10-CM

## 2013-01-14 DIAGNOSIS — W1809XA Striking against other object with subsequent fall, initial encounter: Secondary | ICD-10-CM | POA: Insufficient documentation

## 2013-01-14 NOTE — ED Provider Notes (Signed)
CSN: [id number]     Arrival date & time 01/14/13  2321 History  This chart was scribed for Thomas Jakes, MD by Thomas Mosley, ED Scribe. This patient was seen in room APA01/APA01 and the patient's care was started at 11:55 PM.   Chief Complaint  Patient presents with  . Fall  . Head Laceration    Patient is a 67 y.o. male presenting with fall and scalp laceration. The history is provided by the patient. No language interpreter was used.  Fall This is a new problem. The current episode started 1 to 2 hours ago. The problem occurs rarely. The problem has not changed since onset.Pertinent negatives include no chest pain, no abdominal pain and no shortness of breath. Nothing aggravates the symptoms. Nothing relieves the symptoms. He has tried nothing for the symptoms.  Head Laceration This is a new problem. The current episode started 1 to 2 hours ago. The problem occurs rarely. The problem has not changed since onset.Pertinent negatives include no chest pain, no abdominal pain and no shortness of breath. Nothing aggravates the symptoms. Nothing relieves the symptoms. He has tried nothing for the symptoms.    HPI Comments: Thomas Mosley is a 67 y.o. male brought by EMS to the Emergency Department complaining of a laceration to his occipital head onset PTA when pt states he fell backwards and hit his forehead on the corner of a wall in his bathroom. He states that he went to a wedding today and drank alcohol which he believes caused the fall. Bleeding is controlled. Pt states that he is not on any daily blood thinning medications. Pt denies LOC pertaining to the fall. He denies neck pain, back pain or any other pain onset after the fall. He states that he has been well recently and denies recent illnesses.  PCP- Dr. Kari Mosley   Past Medical History  Diagnosis Date  . Hypertension   . Hypercholesterolemia   . COPD (chronic obstructive pulmonary disease)    Past Surgical History   Procedure Laterality Date  . Cataract extraction w/phaco  02/02/2011    Procedure: CATARACT EXTRACTION PHACO AND INTRAOCULAR LENS PLACEMENT (IOC);  Surgeon: Thomas Mosley;  Location: [facility];  Service: Ophthalmology;  Laterality: Left;  CDE 12.33  . Colonoscopy  02/13/2004    [id number] polyp at 30 cm resected/normal rectum. 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   . Colonoscopy N/A 11/23/2012    Procedure: COLONOSCOPY;  Surgeon: Thomas Ade, MD;  Location: [facility];  Service: Endoscopy;  Laterality: N/A;  12:15-rescheduled to 11:00am Thomas Mosley notified pt   Family History  Problem Relation Age of Onset  . Anesthesia problems Neg Hx   . Hypotension Neg Hx   . Malignant hyperthermia Neg Hx   . Pseudochol deficiency Neg Hx   . Colon cancer Neg Hx    History  Substance Use Topics  . Smoking status: Current Every Day Smoker -- 0.25 packs/day for 40 years    Types: Cigarettes  . Smokeless tobacco: Not on file     Comment: smokes 1-2 cigarettes per day   . Alcohol Use: 0.0 oz/week     Comment: glass of wine about 3 days a week     Review of Systems  Constitutional: Negative for fever and chills.  HENT: Negative for congestion, rhinorrhea and neck pain.   Eyes: Negative for visual disturbance.  Respiratory: Negative for cough and  shortness of breath.   Cardiovascular: Negative for chest pain and leg swelling.  Gastrointestinal: Negative for nausea, vomiting, abdominal pain and diarrhea.  Genitourinary: Negative for dysuria.  Musculoskeletal: Negative for back pain.  Skin: Positive for wound (laceration). Negative for rash.  Neurological: Negative for dizziness and light-headedness.  Hematological: Does not bruise/bleed easily.  Psychiatric/Behavioral: Negative for confusion.  All other systems reviewed and are negative.    Allergies  Review of patient's allergies indicates no known  allergies.  Home Medications   Current Outpatient Rx  Name  Route  Sig  Dispense  Refill  . diltiazem (CARDIZEM CD) 180 MG 24 hr capsule   Oral   Take 180 mg by mouth daily.           . pravastatin (PRAVACHOL) 40 MG tablet   Oral   Take 80 mg by mouth daily.           . polyethylene glycol-electrolytes (TRILYTE) 420 G solution   Oral   Take 4,000 mLs by mouth as directed.   4000 mL   0    BP 155/84  Pulse 91  Resp 21  SpO2 97%  Physical Exam  Nursing note and vitals reviewed. Constitutional: He is oriented to person, place, and time. He appears well-developed and well-nourished. No distress.  HENT:  Head: Normocephalic.  Mucous membranes moist. 7 cm vertical laceration that starts at crown of head and extends occipitally.  Eyes: EOM are normal. Pupils are equal, round, and reactive to light.  Neck: Neck supple. No tracheal deviation present.  Cardiovascular: Normal rate and regular rhythm.   No murmur heard. Pulmonary/Chest: Effort normal and breath sounds normal. No respiratory distress. He has no wheezes.  Abdominal: Soft. Bowel sounds are normal. There is no tenderness.  Musculoskeletal: Normal range of motion.  Neurological: He is alert and oriented to person, place, and time. No cranial nerve deficit.  Skin: Skin is warm and dry.  Psychiatric: He has a normal mood and affect. His behavior is normal.    ED Course  Procedures (including critical care time)  DIAGNOSTIC STUDIES: Oxygen Saturation is 98% on room air  COORDINATION OF CARE: 12:02 AM- Discussed plan to order a CT of pt's head, and plan for laceration repair. Pt advised of plan for treatment and pt agrees.  Labs Review Labs Reviewed  COMPREHENSIVE METABOLIC PANEL - Abnormal; Notable for the following:    Glucose, Bld 101 (*)    Total Bilirubin 0.2 (*)    GFR calc non Af Amer 89 (*)    All other components within normal limits  ETHANOL - Abnormal; Notable for the following:    Alcohol,  Ethyl (B) 180 (*)    All other components within normal limits  CBC WITH DIFFERENTIAL  PROTIME-INR  LIPASE, BLOOD  URINALYSIS, ROUTINE W REFLEX MICROSCOPIC   Results for orders placed during the hospital encounter of 01/14/13  CBC WITH DIFFERENTIAL      Result Value Range   WBC 7.6  4.0 - 10.5 K/uL   RBC 5.03  4.22 - 5.81 MIL/uL   Hemoglobin 15.4  13.0 - 17.0 g/dL   HCT 16.1  09.6 - 04.5 %   MCV 89.7  78.0 - 100.0 fL   MCH 30.6  26.0 - 34.0 pg   MCHC 34.1  30.0 - 36.0 g/dL   RDW 40.9  81.1 - 91.4 %   Platelets 246  150 - 400 K/uL   Neutrophils Relative % 69  43 - 77 %  Neutro Abs 5.3  1.7 - 7.7 K/uL   Lymphocytes Relative 17  12 - 46 %   Lymphs Abs 1.3  0.7 - 4.0 K/uL   Monocytes Relative 12  3 - 12 %   Monocytes Absolute 0.9  0.1 - 1.0 K/uL   Eosinophils Relative 2  0 - 5 %   Eosinophils Absolute 0.1  0.0 - 0.7 K/uL   Basophils Relative 0  0 - 1 %   Basophils Absolute 0.0  0.0 - 0.1 K/uL  COMPREHENSIVE METABOLIC PANEL      Result Value Range   Sodium 139  135 - 145 mEq/L   Potassium 4.5  3.5 - 5.1 mEq/L   Chloride 101  96 - 112 mEq/L   CO2 29  19 - 32 mEq/L   Glucose, Bld 101 (*) 70 - 99 mg/dL   BUN 13  6 - 23 mg/dL   Creatinine, Ser 1.61  0.50 - 1.35 mg/dL   Calcium 9.8  8.4 - 09.6 mg/dL   Total Protein 8.3  6.0 - 8.3 g/dL   Albumin 4.1  3.5 - 5.2 g/dL   AST 19  0 - 37 U/L   ALT 14  0 - 53 U/L   Alkaline Phosphatase 87  39 - 117 U/L   Total Bilirubin 0.2 (*) 0.3 - 1.2 mg/dL   GFR calc non Af Amer 89 (*) >90 mL/min   GFR calc Af Amer >90  >90 mL/min  PROTIME-INR      Result Value Range   Prothrombin Time 12.4  11.6 - 15.2 seconds   INR 0.94  0.00 - 1.49  LIPASE, BLOOD      Result Value Range   Lipase 26  11 - 59 U/L  ETHANOL      Result Value Range   Alcohol, Ethyl (B) 180 (*) 0 - 11 mg/dL    Imaging Review Ct Head Wo Contrast  01/15/2013   CLINICAL DATA:  Fall, left posterior scalp laceration  EXAM: CT HEAD WITHOUT CONTRAST  TECHNIQUE: Contiguous axial  images were obtained from the base of the skull through the vertex without intravenous contrast.  COMPARISON:  None.  FINDINGS: Partial opacification of the left greater than right axillary sinuses is noted. Mucoperiosteal thickening of the frontal and ethmoid sinuses is noted as well as sphenoid sinuses. No skull fracture. Right posterior vertex scalp laceration is noted. There is mild motion artifact at the skull base. There is hyperdensity at the inferior aspect of the right occipital lobe tracking along the gyri compatible with acute subarachnoid hemorrhage. Occasionally subdural hemorrhage may track along the tentorium cerebelli in this location but the distribution is much more typical for subarachnoid hemorrhage. No midline shift or ventriculomegaly. No mass lesion or infarct is identified.  IMPRESSION: Right occipital acute subarachnoid hemorrhage without midline shift or ventriculomegaly. Critical Value/emergent results were called by telephone at the time of interpretation on 01/15/2013 at 12:53 AM to Dr.Hervey Wedig , who verbally acknowledged these results.  Right occipital scalp laceration.   Electronically Signed   By: Christiana Pellant M.D.   On: 01/15/2013 00:55   Dg Chest Port 1 View  01/15/2013   CLINICAL DATA:  Fall, head laceration  EXAM: PORTABLE CHEST - 1 VIEW  COMPARISON:  07/10/2009  FINDINGS: The aorta is unfolded and ectatic. Heart size is at upper limits of normal. Hyperinflation suggests emphysema. No focal pulmonary opacity. No pleural effusion.  IMPRESSION: No acute cardiopulmonary process.   Electronically Signed  By: Christiana Pellant M.D.   On: 01/15/2013 01:18   Wound repair: Scalp laceration clean and irrigated. Anesthetized with a total of 4 cc of 2% lidocaine with epinephrine. Closed with stainless steel staples total of 7. Dressed with bacitracin ointment. Patient tolerated the procedure well.   MDM   1. Fall, initial encounter   2. Head injury, acute, initial  encounter   3. Scalp laceration, initial encounter   4. Subarachnoid hemorrhage following injury, initial encounter    Patient admitted to drinking. Patient the with a fall at home in the bathroom striking his head on the corner of a wall resulting in a 7 cm scalp laceration which was repaired with staples. Head CT done due to the head injury there was no loss of consciousness. However his head CT did raise suspicions for subarachnoid hemorrhage which per radiology was felt to not be traumatic. Head CT reviewed by neurosurgery on call Dr. Jordan Likes, his opinion was that clearly was traumatic patient was safe to go home patient neurologically intact. Patient remained neurologically intact during observation in the ED. Patient will return for any new or worse symptoms. Referral provided to neurosurgery. Followup head CT could be done if any additional problems arise but it was not necessary according to neurosurgery.  Labs were done in anticipation that perhaps patient was require admission based on the opinion of radiology thinking that this was a subarachnoid hemorrhage that preceded the injury. Patient's labs without any acute abnormalities. Alcohol level was 180.  I personally performed the services described in this documentation, which was scribed in my presence. The recorded information has been reviewed and is accurate.     Thomas Jakes, MD 01/15/13 [phone]

## 2013-01-14 NOTE — ED Notes (Signed)
Per family, pt has large laceration to right posterior scalp that was wrapped up by ems.  Wife reports she put sugar on the wound to stop the bleeding.

## 2013-01-14 NOTE — ED Notes (Signed)
Pt states he went to a wedding today and admits to drinking, states he got home and went into the bathroom and lost his balance and hit his head on the corner of the wall.

## 2013-01-15 ENCOUNTER — Emergency Department (HOSPITAL_COMMUNITY): Payer: Medicare Other

## 2013-01-15 LAB — COMPREHENSIVE METABOLIC PANEL
Albumin: 4.1 g/dL (ref 3.5–5.2)
BUN: 13 mg/dL (ref 6–23)
Calcium: 9.8 mg/dL (ref 8.4–10.5)
Chloride: 101 mEq/L (ref 96–112)
Creatinine, Ser: 0.81 mg/dL (ref 0.50–1.35)
Total Bilirubin: 0.2 mg/dL — ABNORMAL LOW (ref 0.3–1.2)

## 2013-01-15 LAB — CBC WITH DIFFERENTIAL/PLATELET
Basophils Absolute: 0 10*3/uL (ref 0.0–0.1)
Basophils Relative: 0 % (ref 0–1)
Eosinophils Absolute: 0.1 10*3/uL (ref 0.0–0.7)
Eosinophils Relative: 2 % (ref 0–5)
HCT: 45.1 % (ref 39.0–52.0)
Hemoglobin: 15.4 g/dL (ref 13.0–17.0)
MCH: 30.6 pg (ref 26.0–34.0)
MCHC: 34.1 g/dL (ref 30.0–36.0)
MCV: 89.7 fL (ref 78.0–100.0)
Monocytes Absolute: 0.9 10*3/uL (ref 0.1–1.0)
Monocytes Relative: 12 % (ref 3–12)
Neutro Abs: 5.3 10*3/uL (ref 1.7–7.7)
RDW: 13.5 % (ref 11.5–15.5)

## 2013-01-15 LAB — PROTIME-INR
INR: 0.94 (ref 0.00–1.49)
Prothrombin Time: 12.4 seconds (ref 11.6–15.2)

## 2013-01-15 LAB — LIPASE, BLOOD: Lipase: 26 U/L (ref 11–59)

## 2013-01-15 MED ORDER — HYDROCODONE-ACETAMINOPHEN 5-325 MG PO TABS: 1.0000 | ORAL_TABLET | Freq: Four times a day (QID) | ORAL | Status: DC | PRN

## 2013-01-15 MED ORDER — LIDOCAINE-EPINEPHRINE (PF) 2 %-1:200000 IJ SOLN
INTRAMUSCULAR | Status: AC
  Administered 2013-01-15: 05:00:00
  Filled 2013-01-15: qty 20

## 2013-01-15 MED ORDER — OXYCODONE-ACETAMINOPHEN 5-325 MG PO TABS
ORAL_TABLET | ORAL | Status: AC
  Administered 2013-01-15: 1 via ORAL
  Filled 2013-01-15: qty 1

## 2013-01-15 MED ORDER — DOUBLE ANTIBIOTIC 500-10000 UNIT/GM EX OINT
TOPICAL_OINTMENT | Freq: Once | CUTANEOUS | Status: AC
  Administered 2013-01-15: 1 via TOPICAL
  Filled 2013-01-15: qty 1

## 2013-01-15 MED ORDER — OXYCODONE-ACETAMINOPHEN 5-325 MG PO TABS: 1.0000 | ORAL_TABLET | Freq: Once | ORAL | Status: AC

## 2013-01-15 MED ORDER — SODIUM CHLORIDE 0.9 % IV SOLN
INTRAVENOUS | Status: DC
  Administered 2013-01-15: 01:00:00 via INTRAVENOUS

## 2013-01-15 NOTE — ED Notes (Signed)
Family asking about pain medication at this time.

## 2014-12-28 ENCOUNTER — Encounter (HOSPITAL_COMMUNITY): Payer: Self-pay | Admitting: Emergency Medicine

## 2014-12-28 ENCOUNTER — Emergency Department (HOSPITAL_COMMUNITY): Payer: Medicare HMO

## 2014-12-28 ENCOUNTER — Emergency Department (HOSPITAL_COMMUNITY)
Admission: EM | Admit: 2014-12-28 | Discharge: 2014-12-28 | Disposition: A | Discharge status: Home / Self Care | Payer: Medicare HMO | Attending: Emergency Medicine | Admitting: Emergency Medicine

## 2014-12-28 DIAGNOSIS — Z79899 Other long term (current) drug therapy: Secondary | ICD-10-CM | POA: Diagnosis not present

## 2014-12-28 DIAGNOSIS — I10 Essential (primary) hypertension: Secondary | ICD-10-CM | POA: Insufficient documentation

## 2014-12-28 DIAGNOSIS — Z720 Tobacco use: Secondary | ICD-10-CM | POA: Insufficient documentation

## 2014-12-28 DIAGNOSIS — J441 Chronic obstructive pulmonary disease with (acute) exacerbation: Secondary | ICD-10-CM | POA: Diagnosis not present

## 2014-12-28 DIAGNOSIS — E780 Pure hypercholesterolemia: Secondary | ICD-10-CM | POA: Insufficient documentation

## 2014-12-28 DIAGNOSIS — R0602 Shortness of breath: Secondary | ICD-10-CM | POA: Diagnosis present

## 2014-12-28 LAB — CBC WITH DIFFERENTIAL/PLATELET
BASOS ABS: 0 10*3/uL (ref 0.0–0.1)
BASOS PCT: 0 %
EOS ABS: 0.3 10*3/uL (ref 0.0–0.7)
EOS PCT: 4 %
HCT: 43.6 % (ref 39.0–52.0)
HEMOGLOBIN: 15.3 g/dL (ref 13.0–17.0)
LYMPHS ABS: 1.5 10*3/uL (ref 0.7–4.0)
Lymphocytes Relative: 17 %
MCH: 31.3 pg (ref 26.0–34.0)
MCHC: 35.1 g/dL (ref 30.0–36.0)
MCV: 89.2 fL (ref 78.0–100.0)
Monocytes Absolute: 1.2 10*3/uL — ABNORMAL HIGH (ref 0.1–1.0)
Monocytes Relative: 14 %
NEUTROS PCT: 64 %
Neutro Abs: 5.5 10*3/uL (ref 1.7–7.7)
PLATELETS: 177 10*3/uL (ref 150–400)
RBC: 4.89 MIL/uL (ref 4.22–5.81)
RDW: 13.3 % (ref 11.5–15.5)
WBC: 8.6 10*3/uL (ref 4.0–10.5)

## 2014-12-28 LAB — COMPREHENSIVE METABOLIC PANEL
ALT: 15 U/L — AB (ref 17–63)
AST: 19 U/L (ref 15–41)
Albumin: 4.1 g/dL (ref 3.5–5.0)
Alkaline Phosphatase: 84 U/L (ref 38–126)
Anion gap: 4 — ABNORMAL LOW (ref 5–15)
BILIRUBIN TOTAL: 0.5 mg/dL (ref 0.3–1.2)
BUN: 18 mg/dL (ref 6–20)
CHLORIDE: 101 mmol/L (ref 101–111)
CO2: 30 mmol/L (ref 22–32)
CREATININE: 0.9 mg/dL (ref 0.61–1.24)
Calcium: 8.9 mg/dL (ref 8.9–10.3)
Glucose, Bld: 110 mg/dL — ABNORMAL HIGH (ref 65–99)
Potassium: 4.6 mmol/L (ref 3.5–5.1)
Sodium: 135 mmol/L (ref 135–145)
TOTAL PROTEIN: 7.6 g/dL (ref 6.5–8.1)

## 2014-12-28 LAB — TROPONIN I

## 2014-12-28 MED ORDER — ALBUTEROL SULFATE HFA 108 (90 BASE) MCG/ACT IN AERS: 2.0000 | INHALATION_SPRAY | RESPIRATORY_TRACT | Status: DC | PRN

## 2014-12-28 MED ORDER — AZITHROMYCIN 250 MG PO TABS: ORAL_TABLET | ORAL | Status: DC

## 2014-12-28 MED ORDER — IPRATROPIUM BROMIDE 0.02 % IN SOLN
0.5000 mg | Freq: Once | RESPIRATORY_TRACT | Status: AC
  Administered 2014-12-28: 0.5 mg via RESPIRATORY_TRACT
  Filled 2014-12-28: qty 2.5

## 2014-12-28 MED ORDER — ALBUTEROL SULFATE HFA 108 (90 BASE) MCG/ACT IN AERS
2.0000 | INHALATION_SPRAY | RESPIRATORY_TRACT | Status: DC | PRN
  Administered 2014-12-28: 2 via RESPIRATORY_TRACT
  Filled 2014-12-28: qty 6.7

## 2014-12-28 MED ORDER — METHYLPREDNISOLONE SODIUM SUCC 125 MG IJ SOLR
125.0000 mg | Freq: Once | INTRAMUSCULAR | Status: AC
  Administered 2014-12-28: 125 mg via INTRAVENOUS
  Filled 2014-12-28: qty 2

## 2014-12-28 MED ORDER — AEROCHAMBER Z-STAT PLUS/MEDIUM MISC
1.0000 | Freq: Once | Status: AC
  Administered 2014-12-28: 1
  Filled 2014-12-28: qty 1

## 2014-12-28 MED ORDER — PREDNISONE 20 MG PO TABS: ORAL_TABLET | ORAL | Status: DC

## 2014-12-28 MED ORDER — ALBUTEROL SULFATE (2.5 MG/3ML) 0.083% IN NEBU: 5.0000 mg | INHALATION_SOLUTION | Freq: Once | RESPIRATORY_TRACT | Status: DC

## 2014-12-28 MED ORDER — ALBUTEROL (5 MG/ML) CONTINUOUS INHALATION SOLN
15.0000 mg/h | INHALATION_SOLUTION | Freq: Once | RESPIRATORY_TRACT | Status: AC
  Administered 2014-12-28: 15 mg/h via RESPIRATORY_TRACT
  Filled 2014-12-28: qty 20

## 2014-12-28 NOTE — ED Notes (Signed)
Patient has been having increased shortness of breath for several days, worse today after mowing lawn.

## 2014-12-28 NOTE — ED Provider Notes (Signed)
CSN: [id number]     Arrival date & time 12/28/14  0023 History  This chart was scribed for Rolland Porter, MD by Terressa Koyanagi, ED Scribe. This patient was seen in room APA10/APA10 and the patient's care was started at 12:37 AM.   Chief Complaint  Patient presents with  . Shortness of Breath   The history is provided by the patient. No language interpreter was used.   PCP: Alonza Bogus, MD HPI Comments: KWINTON MAAHS is a 69 y.o. male, with PMHx noted below including COPD, daily tobacco use (0.25ppd), who presents to the Emergency Department complaining of chronic, worsening SOB onset several days ago and worsening today after pt mowed his lawn for 6 hours. He states it was very dusty because it has not rained in a long time. He describes wheezing. Pt denies chest pain, fever, chest wall tenderness, coughing or any other Sx at this time. He states he has chronic dyspnea on exertion. He states he was prescribed an inhaler last year however he never used it.  PCP Dr Luan Pulling  Past Medical History  Diagnosis Date  . Hypertension   . Hypercholesterolemia   . COPD (chronic obstructive pulmonary disease)    Past Surgical History  Procedure Laterality Date  . Cataract extraction w/phaco  02/02/2011    Procedure: CATARACT EXTRACTION PHACO AND INTRAOCULAR LENS PLACEMENT (IOC);  Surgeon: Tonny Branch;  Location: [facility];  Service: Ophthalmology;  Laterality: Left;  CDE 12.33  . Colonoscopy  02/13/2004    [id number] polyp at 30 cm resected/normal rectum. 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   . Colonoscopy N/A 11/23/2012    Procedure: COLONOSCOPY;  Surgeon: Daneil Dolin, MD;  Location: [facility];  Service: Endoscopy;  Laterality: N/A;  12:15-rescheduled to 11:00am Darius Bump notified pt   Family History  Problem Relation Age of Onset  . Anesthesia problems Neg Hx   . Hypotension Neg Hx   . Malignant  hyperthermia Neg Hx   . Pseudochol deficiency Neg Hx   . Colon cancer Neg Hx    Social History  Substance Use Topics  . Smoking status: Current Every Day Smoker -- 0.25 packs/day for 40 years    Types: Cigarettes  . Smokeless tobacco: None     Comment: smokes 1-2 cigarettes per day   . Alcohol Use: 0.0 oz/week     Comment: glass of wine about 3 days a week   lives at home Lives with spouse  Review of Systems  Constitutional: Negative for fever.  Respiratory: Positive for shortness of breath. Negative for chest tightness.   Cardiovascular: Negative for chest pain.  All other systems reviewed and are negative.     Allergies  Review of patient's allergies indicates no known allergies.  Home Medications   Prior to Admission medications   Medication Sig Start Date End Date Taking? Authorizing Provider  diltiazem (CARDIZEM CD) 180 MG 24 hr capsule Take 180 mg by mouth daily.     Yes Historical Provider, MD  pravastatin (PRAVACHOL) 40 MG tablet Take 80 mg by mouth daily.     Yes Historical Provider, MD  albuterol (PROVENTIL HFA;VENTOLIN HFA) 108 (90 BASE) MCG/ACT inhaler Inhale 2 puffs into the lungs every 4 (four) hours as needed. 12/28/14   Rolland Porter, MD  azithromycin (ZITHROMAX) 250 MG tablet Take 2 po the first day then once a day for the next 4 days. 12/28/14   Iva  Tomi Bamberger, MD  HYDROcodone-acetaminophen (NORCO/VICODIN) 5-325 MG per tablet Take 1-2 tablets by mouth every 6 (six) hours as needed for pain. 01/15/13   Fredia Sorrow, MD  polyethylene glycol-electrolytes (TRILYTE) 420 G solution Take 4,000 mLs by mouth as directed. 11/02/12   Daneil Dolin, MD  predniSONE (DELTASONE) 20 MG tablet Take 3 po QD x 3d , then 2 po QD x 3d then 1 po QD x 3d 12/28/14   Rolland Porter, MD   Triage Vitals: Pulse 84  Temp(Src) 98 F (36.7 C) (Oral)  Resp 20  Ht 6\' 2"  (1.88 m)  Wt 164 lb (74.39 kg)  BMI 21.05 kg/m2  SpO2 96%  Vital signs normal   Physical Exam  Constitutional: He is oriented  to person, place, and time. He appears well-developed and well-nourished.  Non-toxic appearance. He does not appear ill. No distress.  HENT:  Head: Normocephalic and atraumatic.  Right Ear: External ear normal.  Left Ear: External ear normal.  Nose: Nose normal. No mucosal edema or rhinorrhea.  Mouth/Throat: Oropharynx is clear and moist and mucous membranes are normal. No dental abscesses or uvula swelling.  Eyes: Conjunctivae and EOM are normal. Pupils are equal, round, and reactive to light.  Neck: Normal range of motion and full passive range of motion without pain. Neck supple.  Cardiovascular: Normal rate, regular rhythm and normal heart sounds.  Exam reveals no gallop and no friction rub.   No murmur heard. Pulmonary/Chest: Accessory muscle usage present. Tachypnea noted. He is in respiratory distress. He has decreased breath sounds. He has wheezes (diiminished with espiratory wheezing ). He has no rhonchi. He has no rales. He exhibits no tenderness and no crepitus.  Tachypnic with retractions.   Abdominal: Soft. Normal appearance and bowel sounds are normal. He exhibits no distension. There is no tenderness. There is no rebound and no guarding.  Musculoskeletal: Normal range of motion. He exhibits no edema or tenderness.  Moves all extremities well.   Neurological: He is alert and oriented to person, place, and time. He has normal strength. No cranial nerve deficit.  Skin: Skin is warm, dry and intact. No rash noted. No erythema. No pallor.  Psychiatric: He has a normal mood and affect. His speech is normal and behavior is normal. His mood appears not anxious.  Nursing note and vitals reviewed.   ED Course  Procedures (including critical care time)  Medications  albuterol (PROVENTIL HFA;VENTOLIN HFA) 108 (90 BASE) MCG/ACT inhaler 2 puff (not administered)  aerochamber Z-Stat Plus/medium 1 each (not administered)  albuterol (PROVENTIL,VENTOLIN) solution continuous neb (15 mg/hr  Nebulization Given 12/28/14 0047)  ipratropium (ATROVENT) nebulizer solution 0.5 mg (0.5 mg Nebulization Given 12/28/14 0047)  methylPREDNISolone sodium succinate (SOLU-MEDROL) 125 mg/2 mL injection 125 mg (125 mg Intravenous Given 12/28/14 0126)    DIAGNOSTIC STUDIES: Oxygen Saturation is 96% on RA, nl by my interpretation.    COORDINATION OF CARE: 12:41 AM: Discussed treatment plan which includes continuous nebulizer, IV steroids with pt at bedside; patient verbalizes understanding and agrees with treatment plan.  Recheck at 3:30 AM. Patient is visibly much improved. On lung exam he has improved air movement and is no longer having any wheezing or rhonchi. He was given a inhaler and spacer to use and instructed how to use it in the emergency department. Due to his underlying COPD and he does smoke he was started on antibiotics and steroids.  Labs Review Results for orders placed or performed during the hospital encounter of 12/28/14  Comprehensive metabolic panel  Result Value Ref Range   Sodium 135 135 - 145 mmol/L   Potassium 4.6 3.5 - 5.1 mmol/L   Chloride 101 101 - 111 mmol/L   CO2 30 22 - 32 mmol/L   Glucose, Bld 110 (H) 65 - 99 mg/dL   BUN 18 6 - 20 mg/dL   Creatinine, Ser 0.90 0.61 - 1.24 mg/dL   Calcium 8.9 8.9 - 10.3 mg/dL   Total Protein 7.6 6.5 - 8.1 g/dL   Albumin 4.1 3.5 - 5.0 g/dL   AST 19 15 - 41 U/L   ALT 15 (L) 17 - 63 U/L   Alkaline Phosphatase 84 38 - 126 U/L   Total Bilirubin 0.5 0.3 - 1.2 mg/dL   GFR calc non Af Amer >60 >60 mL/min   GFR calc Af Amer >60 >60 mL/min   Anion gap 4 (L) 5 - 15  CBC with Differential  Result Value Ref Range   WBC 8.6 4.0 - 10.5 K/uL   RBC 4.89 4.22 - 5.81 MIL/uL   Hemoglobin 15.3 13.0 - 17.0 g/dL   HCT 43.6 39.0 - 52.0 %   MCV 89.2 78.0 - 100.0 fL   MCH 31.3 26.0 - 34.0 pg   MCHC 35.1 30.0 - 36.0 g/dL   RDW 13.3 11.5 - 15.5 %   Platelets 177 150 - 400 K/uL   Neutrophils Relative % 64 %   Neutro Abs 5.5 1.7 - 7.7 K/uL    Lymphocytes Relative 17 %   Lymphs Abs 1.5 0.7 - 4.0 K/uL   Monocytes Relative 14 %   Monocytes Absolute 1.2 (H) 0.1 - 1.0 K/uL   Eosinophils Relative 4 %   Eosinophils Absolute 0.3 0.0 - 0.7 K/uL   Basophils Relative 0 %   Basophils Absolute 0.0 0.0 - 0.1 K/uL  Troponin I  Result Value Ref Range   Troponin I <0.03 <0.031 ng/mL   Laboratory interpretation all normal     Imaging Review Dg Chest Portable 1 View  12/28/2014   CLINICAL DATA:  69 year old male with shortness of breath and wheezing  EXAM: PORTABLE CHEST - 1 VIEW  COMPARISON:  Radiograph dated 01/15/2013  FINDINGS: The heart size and mediastinal contours are within normal limits. Both lungs are clear. The visualized skeletal structures are unremarkable.  IMPRESSION: No active disease.   Electronically Signed   By: Anner Crete M.D.   On: 12/28/2014 01:19   I have personally reviewed and evaluated these images and lab results as part of my medical decision-making.   EKG Interpretation   Date/Time:  Friday December 28 2014 00:33:12 EDT Ventricular Rate:  88 PR Interval:  172 QRS Duration: 83 QT Interval:  380 QTC Calculation: 460 R Axis:   64 Text Interpretation:  Sinus rhythm Borderline low voltage, extremity leads  Probable anteroseptal infarct Since last tracing rate faster (27 Jan 2011)  Confirmed by [facility]  MD-I, IVA ([id number]) on 12/28/2014 1:23:58 AM      MDM   Final diagnoses:  COPD with acute exacerbation    New Prescriptions   ALBUTEROL (PROVENTIL HFA;VENTOLIN HFA) 108 (90 BASE) MCG/ACT INHALER    Inhale 2 puffs into the lungs every 4 (four) hours as needed.   AZITHROMYCIN (ZITHROMAX) 250 MG TABLET    Take 2 po the first day then once a day for the next 4 days.   PREDNISONE (DELTASONE) 20 MG TABLET    Take 3 po QD x 3d , then 2 po QD x  3d then 1 po QD x 3d    Plan discharge  Rolland Porter, MD, Barbette Or, MD 12/28/14 [id number]

## 2014-12-28 NOTE — Discharge Instructions (Signed)
Try to stop smoking !!! Avoid dusty environments, it will make your breathing worse. Wear a mask to mow grass. Take the medications as prescribed. Use the inhaler with the spacer for wheezing and shortness of breath. Recheck if you get worse again.  Chronic Obstructive Pulmonary Disease Exacerbation  Chronic obstructive pulmonary disease (COPD) is a common lung problem. In COPD, the flow of air from the lungs is limited. COPD exacerbations are times that breathing gets worse and you need extra treatment. Without treatment they can be life threatening. If they happen often, your lungs can become more damaged. HOME CARE 1. Do not smoke. 2. Avoid tobacco smoke and other things that bother your lungs. 3. If given, take your antibiotic medicine as told. Finish the medicine even if you start to feel better. 4. Only take medicines as told by your doctor. 5. Drink enough fluids to keep your pee (urine) clear or pale yellow (unless your doctor has told you not to). 6. Use a cool mist machine (vaporizer). 7. If you use oxygen or a machine that turns liquid medicine into a mist (nebulizer), continue to use them as told. 8. Keep up with shots (vaccinations) as told by your doctor. 9. Exercise regularly. 10. Eat healthy foods. 73. Keep all doctor visits as told. GET HELP RIGHT AWAY IF:  You are very short of breath and it gets worse.  You have trouble talking.  You have bad chest pain.  You have blood in your spit (sputum).  You have a fever.  You keep throwing up (vomiting).  You feel weak, or you pass out (faint).  You feel confused.  You keep getting worse. MAKE SURE YOU:   Understand these instructions.  Will watch your condition.  Will get help right away if you are not doing well or get worse. Document Released: 03/19/2011 Document Revised: 01/18/2013 Document Reviewed: 12/02/2012 [facility] Patient Information 2015 [place], [place]. This information is not intended to replace advice  given to you by your health care provider. Make sure you discuss any questions you have with your health care provider.  Metered Dose Inhaler with Spacer Inhaled medicines are the basis of treatment of asthma and other breathing problems. Inhaled medicine can only be effective if used properly. Good technique assures that the medicine reaches the lungs. Your health care provider has asked you to use a spacer with your inhaler to help you take the medicine more effectively. A spacer is a plastic tube with a mouthpiece on one end and an opening that connects to the inhaler on the other end. Metered dose inhalers (MDIs) are used to deliver a variety of inhaled medicines. These include quick relief or rescue medicines (such as bronchodilators) and controller medicines (such as corticosteroids). The medicine is delivered by pushing down on a metal canister to release a set amount of spray. If you are using different kinds of inhalers, use your quick relief medicine to open the airways 10-15 minutes before using a steroid if instructed to do so by your health care provider. If you are unsure which inhalers to use and the order of using them, ask your health care provider, nurse, or respiratory therapist. HOW TO USE THE INHALER WITH A SPACER 12. Remove cap from inhaler. 13. If you are using the inhaler for the first time, you will need to prime it. Shake the inhaler for 5 seconds and release four puffs into the air, away from your face. Ask your health care provider or pharmacist if you  have questions about priming your inhaler. 14. Shake inhaler for 5 seconds before each breath in (inhalation). 15. Place the open end of the spacer onto the mouthpiece of the inhaler. 16. Position the inhaler so that the top of the canister faces up and the spacer mouthpiece faces you. 17. Put your index finger on the top of the medicine canister. Your thumb supports the bottom of the inhaler and the spacer. 18. Breathe out  (exhale) normally and as completely as possible. 19. Immediately after exhaling, place the spacer between your teeth and into your mouth. Close your mouth tightly around the spacer. 20. Press the canister down with the index finger to release the medicine. 21. At the same time as the canister is pressed, inhale deeply and slowly until the lungs are completely filled. This should take 4-6 seconds. Keep your tongue down and out of the way. 22. Hold the medicine in your lungs for 5-10 seconds (10 seconds is best). This helps the medicine get into the small airways of your lungs. Exhale. 23. Repeat inhaling deeply through the spacer mouthpiece. Again hold that breath for up to 10 seconds (10 seconds is best). Exhale slowly. If it is difficult to take this second deep breath through the spacer, breathe normally several times through the spacer. Remove the spacer from your mouth. 24. Wait at least 15-30 seconds between puffs. Continue with the above steps until you have taken the number of puffs your health care provider has ordered. Do not use the inhaler more than your health care provider directs you to. 25. Remove spacer from the inhaler and place cap on inhaler. 26. Follow the directions from your health care provider or the inhaler insert for cleaning the inhaler and spacer. If you are using a steroid inhaler, rinse your mouth with water after your last puff, gargle, and spit out the water. Do not swallow the water. AVOID:  Inhaling before or after starting the spray of medicine. It takes practice to coordinate your breathing with triggering the spray.  Inhaling through the nose (rather than the mouth) when triggering the spray. HOW TO DETERMINE IF YOUR INHALER IS FULL OR NEARLY EMPTY You cannot know when an inhaler is empty by shaking it. A few inhalers are now being made with dose counters. Ask your health care provider for a prescription that has a dose counter if you feel you need that extra  help. If your inhaler does not have a counter, ask your health care provider to help you determine the date you need to refill your inhaler. Write the refill date on a calendar or your inhaler canister. Refill your inhaler 7-10 days before it runs out. Be sure to keep an adequate supply of medicine. This includes making sure it is not expired, and you have a spare inhaler.  SEEK MEDICAL CARE IF:   Symptoms are only partially relieved with your inhaler.  You are having trouble using your inhaler.  You experience some increase in phlegm. SEEK IMMEDIATE MEDICAL CARE IF:   You feel little or no relief with your inhalers. You are still wheezing and are feeling shortness of breath or tightness in your chest or both.  You have dizziness, headaches, or fast heart rate.  You have chills, fever, or night sweats.  There is a noticeable increase in phlegm production, or there is blood in the phlegm. Document Released: 03/30/2005 Document Revised: 08/14/2013 Document Reviewed: 09/15/2012 [facility] Patient Information 2015 [place], [place]. This information is not intended to  replace advice given to you by your health care provider. Make sure you discuss any questions you have with your health care provider. ° °

## 2015-09-30 ENCOUNTER — Other Ambulatory Visit (HOSPITAL_COMMUNITY): Payer: Self-pay | Admitting: Pulmonary Disease

## 2015-10-01 ENCOUNTER — Emergency Department (HOSPITAL_COMMUNITY): Payer: Medicare HMO

## 2015-10-01 ENCOUNTER — Other Ambulatory Visit: Payer: Self-pay

## 2015-10-01 ENCOUNTER — Inpatient Hospital Stay (HOSPITAL_COMMUNITY)
Admission: EM | Admit: 2015-10-01 | Discharge: 2015-10-04 | DRG: 247 | Disposition: A | Discharge status: Home / Self Care | Payer: Medicare HMO | Attending: Cardiovascular Disease | Admitting: Cardiovascular Disease

## 2015-10-01 ENCOUNTER — Encounter (HOSPITAL_COMMUNITY): Payer: Self-pay | Admitting: Emergency Medicine

## 2015-10-01 DIAGNOSIS — Z720 Tobacco use: Secondary | ICD-10-CM

## 2015-10-01 DIAGNOSIS — Z79899 Other long term (current) drug therapy: Secondary | ICD-10-CM

## 2015-10-01 DIAGNOSIS — R739 Hyperglycemia, unspecified: Secondary | ICD-10-CM | POA: Diagnosis present

## 2015-10-01 DIAGNOSIS — F17210 Nicotine dependence, cigarettes, uncomplicated: Secondary | ICD-10-CM | POA: Diagnosis present

## 2015-10-01 DIAGNOSIS — J449 Chronic obstructive pulmonary disease, unspecified: Secondary | ICD-10-CM | POA: Diagnosis present

## 2015-10-01 DIAGNOSIS — R079 Chest pain, unspecified: Secondary | ICD-10-CM

## 2015-10-01 DIAGNOSIS — I10 Essential (primary) hypertension: Secondary | ICD-10-CM | POA: Diagnosis present

## 2015-10-01 DIAGNOSIS — I214 Non-ST elevation (NSTEMI) myocardial infarction: Secondary | ICD-10-CM | POA: Diagnosis not present

## 2015-10-01 DIAGNOSIS — Z8249 Family history of ischemic heart disease and other diseases of the circulatory system: Secondary | ICD-10-CM

## 2015-10-01 DIAGNOSIS — I255 Ischemic cardiomyopathy: Secondary | ICD-10-CM | POA: Diagnosis present

## 2015-10-01 DIAGNOSIS — I237 Postinfarction angina: Secondary | ICD-10-CM | POA: Diagnosis present

## 2015-10-01 DIAGNOSIS — I471 Supraventricular tachycardia: Secondary | ICD-10-CM | POA: Diagnosis not present

## 2015-10-01 DIAGNOSIS — E7800 Pure hypercholesterolemia, unspecified: Secondary | ICD-10-CM | POA: Diagnosis present

## 2015-10-01 DIAGNOSIS — Z823 Family history of stroke: Secondary | ICD-10-CM

## 2015-10-01 DIAGNOSIS — Z955 Presence of coronary angioplasty implant and graft: Secondary | ICD-10-CM

## 2015-10-01 DIAGNOSIS — R109 Unspecified abdominal pain: Secondary | ICD-10-CM

## 2015-10-01 DIAGNOSIS — E785 Hyperlipidemia, unspecified: Secondary | ICD-10-CM | POA: Diagnosis present

## 2015-10-01 LAB — BASIC METABOLIC PANEL
Anion gap: 7 (ref 5–15)
BUN: 18 mg/dL (ref 6–20)
CO2: 28 mmol/L (ref 22–32)
Calcium: 9.5 mg/dL (ref 8.9–10.3)
Chloride: 102 mmol/L (ref 101–111)
Creatinine, Ser: 0.84 mg/dL (ref 0.61–1.24)
GFR calc Af Amer: >60 mL/min (ref >60)
GLUCOSE: 157 mg/dL — AB (ref 65–99)
POTASSIUM: 4.1 mmol/L (ref 3.5–5.1)
Sodium: 137 mmol/L (ref 135–145)

## 2015-10-01 LAB — LIPASE, BLOOD: Lipase: 24 U/L (ref 11–51)

## 2015-10-01 LAB — DIFFERENTIAL
BASOS ABS: 0 10*3/uL (ref 0.0–0.1)
Basophils Relative: 0 %
Eosinophils Absolute: 0.2 10*3/uL (ref 0.0–0.7)
Eosinophils Relative: 3 %
LYMPHS PCT: 13 %
Lymphs Abs: 1 10*3/uL (ref 0.7–4.0)
MONO ABS: 1.1 10*3/uL — AB (ref 0.1–1.0)
Monocytes Relative: 15 %
NEUTROS ABS: 4.9 10*3/uL (ref 1.7–7.7)
Neutrophils Relative %: 69 %

## 2015-10-01 LAB — HEPATIC FUNCTION PANEL
ALBUMIN: 4.7 g/dL (ref 3.5–5.0)
ALK PHOS: 94 U/L (ref 38–126)
ALT: 16 U/L — AB (ref 17–63)
AST: 22 U/L (ref 15–41)
BILIRUBIN INDIRECT: 0.6 mg/dL (ref 0.3–0.9)
Bilirubin, Direct: 0.1 mg/dL (ref 0.1–0.5)
TOTAL PROTEIN: 8.3 g/dL — AB (ref 6.5–8.1)
Total Bilirubin: 0.7 mg/dL (ref 0.3–1.2)

## 2015-10-01 LAB — CBC
HEMATOCRIT: 47.3 % (ref 39.0–52.0)
Hemoglobin: 16.3 g/dL (ref 13.0–17.0)
MCH: 31 pg (ref 26.0–34.0)
MCHC: 34.5 g/dL (ref 30.0–36.0)
MCV: 90.1 fL (ref 78.0–100.0)
Platelets: 184 10*3/uL (ref 150–400)
RBC: 5.25 MIL/uL (ref 4.22–5.81)
RDW: 13.2 % (ref 11.5–15.5)
WBC: 7.2 10*3/uL (ref 4.0–10.5)

## 2015-10-01 LAB — I-STAT TROPONIN, ED: Troponin i, poc: 0 ng/mL (ref 0.00–0.08)

## 2015-10-01 LAB — PROTIME-INR
INR: 1.01 (ref 0.00–1.49)
PROTHROMBIN TIME: 13.5 s (ref 11.6–15.2)

## 2015-10-01 LAB — LIPID PANEL
CHOLESTEROL: 169 mg/dL (ref 0–200)
HDL: 53 mg/dL (ref >40)
LDL Cholesterol: 101 mg/dL — ABNORMAL HIGH (ref 0–99)
TRIGLYCERIDES: 76 mg/dL (ref <150)
Total CHOL/HDL Ratio: 3.2 RATIO
VLDL: 15 mg/dL (ref 0–40)

## 2015-10-01 LAB — TROPONIN I: Troponin I: <0.03 ng/mL (ref <0.031)

## 2015-10-01 LAB — APTT: APTT: 26 s (ref 24–37)

## 2015-10-01 MED ORDER — SODIUM CHLORIDE 0.9 % IV SOLN
10.0000 mL/h | INTRAVENOUS | Status: DC
  Administered 2015-10-01: 20 mL/h via INTRAVENOUS

## 2015-10-01 MED ORDER — ASPIRIN 81 MG PO CHEW
324.0000 mg | CHEWABLE_TABLET | Freq: Once | ORAL | Status: AC
Start: 2015-10-01 — End: 2015-10-01
  Administered 2015-10-01: 324 mg via ORAL
  Filled 2015-10-01: qty 4

## 2015-10-01 MED ORDER — GI COCKTAIL ~~LOC~~
30.0000 mL | Freq: Once | ORAL | Status: AC
  Administered 2015-10-01: 30 mL via ORAL
  Filled 2015-10-01: qty 30

## 2015-10-01 NOTE — ED Notes (Addendum)
Pt c/o central chest pain that radiates down left arm. Pt seen pcp yesterday for the same and diagnosed with acid reflux.

## 2015-10-01 NOTE — ED Provider Notes (Signed)
CSN: [id number]     Arrival date & time 10/01/15  2222 History  By signing my name below, I, Nicole Kindred, attest that this documentation has been prepared under the direction and in the presence of Ezequiel Essex, MD.   Electronically Signed: Nicole Kindred, ED Scribe. 10/01/2015. 10:37 PM   Chief Complaint  Patient presents with  . Chest Pain    The history is provided by the patient. No language interpreter was used.   HPI Comments: Thomas Mosley is a 70 y.o. male with PMHx of HTN, hypercholesterolemia, and COPD who presents to the Emergency Department complaining of gradual onset, midsternal chest pain, beginning about two hours ago while pt was watching TV after eating. His wife states he has had intermittent chest pain for the last month. Tonight's episode lasted longer than his previous episodes. Pt reports his pain radiates into his left arm. Pt also complains of shortness of breath. Pt was seen by his PCP, Dr. Luan Pulling, for similar symptoms yesterday. No other associated symptoms noted. Pt has not taken any medications PTA. Nothing makes his symptoms better or worse. Pt denies abdominal pain, back pain, nausea, emesis, diaphoresis, dizziness, light-headedness, or any other pertinent symptoms.   Past Medical History  Diagnosis Date  . Hypertension   . Hypercholesterolemia   . COPD (chronic obstructive pulmonary disease) [facility])    Past Surgical History  Procedure Laterality Date  . Cataract extraction w/phaco  02/02/2011    Procedure: CATARACT EXTRACTION PHACO AND INTRAOCULAR LENS PLACEMENT (IOC);  Surgeon: Tonny Branch;  Location: [facility] ORS;  Service: Ophthalmology;  Laterality: Left;  CDE 12.33  . Colonoscopy  02/13/2004    [id number] polyp at 30 cm resected/normal rectum. 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   . Colonoscopy N/A 11/23/2012    Procedure: COLONOSCOPY;  Surgeon: Daneil Dolin, MD;  Location: [facility] ENDO SUITE;  Service: Endoscopy;  Laterality: N/A;  12:15-rescheduled to 11:00am Darius Bump notified pt   Family History  Problem Relation Age of Onset  . Anesthesia problems Neg Hx   . Hypotension Neg Hx   . Malignant hyperthermia Neg Hx   . Pseudochol deficiency Neg Hx   . Colon cancer Neg Hx    Social History  Substance Use Topics  . Smoking status: Former Smoker -- 0.25 packs/day for 40 years    Types: Cigarettes  . Smokeless tobacco: None     Comment: smokes 1-2 cigarettes per day   . Alcohol Use: 0.0 oz/week     Comment: glass of wine about 3 days a week     Review of Systems A complete 10 system review of systems was obtained and all systems are negative except as noted in the HPI and PMH.    Allergies  Review of patient's allergies indicates no known allergies.  Home Medications   Prior to Admission medications   Medication Sig Start Date End Date Taking? Authorizing Provider  albuterol (PROVENTIL HFA;VENTOLIN HFA) 108 (90 BASE) MCG/ACT inhaler Inhale 2 puffs into the lungs every 4 (four) hours as needed. 12/28/14   Rolland Porter, MD  azithromycin (ZITHROMAX) 250 MG tablet Take 2 po the first day then once a day for the next 4 days. 12/28/14   Rolland Porter, MD  diltiazem (CARDIZEM CD) 180 MG 24 hr capsule Take 180 mg by mouth daily.      Historical Provider, MD  HYDROcodone-acetaminophen (NORCO/VICODIN) 5-325 MG per tablet Take 1-2  tablets by mouth every 6 (six) hours as needed for pain. 01/15/13   Fredia Sorrow, MD  polyethylene glycol-electrolytes (TRILYTE) 420 G solution Take 4,000 mLs by mouth as directed. 11/02/12   Daneil Dolin, MD  pravastatin (PRAVACHOL) 40 MG tablet Take 80 mg by mouth daily.      Historical Provider, MD  predniSONE (DELTASONE) 20 MG tablet Take 3 po QD x 3d , then 2 po QD x 3d then 1 po QD x 3d 12/28/14   Rolland Porter, MD   BP 174/80 mmHg  Pulse 76  Temp(Src) 97.9 F (36.6 C) (Oral)  Resp 20  Ht 6\' 2"  (1.88 m)  Wt 165 lb  (74.844 kg)  BMI 21.18 kg/m2  SpO2 99% Physical Exam  Constitutional: He is oriented to person, place, and time. He appears well-developed and well-nourished. No distress.  HENT:  Head: Normocephalic and atraumatic.  Mouth/Throat: Oropharynx is clear and moist. No oropharyngeal exudate.  Eyes: Conjunctivae and EOM are normal. Pupils are equal, round, and reactive to light.  Neck: Normal range of motion. Neck supple.  No meningismus.  Cardiovascular: Normal rate, regular rhythm, normal heart sounds and intact distal pulses.   No murmur heard. Pulses:      Radial pulses are 3+ on the right side, and 3+ on the left side.  Pulmonary/Chest: Effort normal and breath sounds normal. No respiratory distress. He has no wheezes. He has no rales.  Pectus excavatum.  Abdominal: Soft. There is no tenderness. There is no rebound and no guarding.  Musculoskeletal: Normal range of motion. He exhibits no edema or tenderness.  Neurological: He is alert and oriented to person, place, and time. No cranial nerve deficit. He exhibits normal muscle tone. Coordination normal.  No ataxia on finger to nose bilaterally. No pronator drift. 5/5 strength throughout. CN 2-12 intact.Equal grip strength. Sensation intact.   Skin: Skin is warm.  Psychiatric: He has a normal mood and affect. His behavior is normal.  Nursing note and vitals reviewed.   ED Course  Procedures (including critical care time) DIAGNOSTIC STUDIES: Oxygen Saturation is 99% on nasal canula, normal by my interpretation.    COORDINATION OF CARE: 10:41 PM Discussed treatment plan which includes CXR, BMP, CBC, troponin I, and EKG with pt at bedside and pt agreed to plan.  10:54 PM Consult with cardiology, Dr. Einar Gip. EKG similar to previous with J point elevation and early repolarization. Do not activate STEMI at this time.  Labs Review Labs Reviewed  BASIC METABOLIC PANEL - Abnormal; Notable for the following:    Glucose, Bld 157 (*)    All  other components within normal limits  DIFFERENTIAL - Abnormal; Notable for the following:    Monocytes Absolute 1.1 (*)    All other components within normal limits  LIPID PANEL - Abnormal; Notable for the following:    LDL Cholesterol 101 (*)    All other components within normal limits  HEPATIC FUNCTION PANEL - Abnormal; Notable for the following:    Total Protein 8.3 (*)    ALT 16 (*)    All other components within normal limits  CBC  TROPONIN I  PROTIME-INR  APTT  LIPASE, BLOOD  I-STAT TROPOININ, ED  I-STAT TROPOININ, ED   Imaging Review Dg Chest Port 1 View  10/01/2015  CLINICAL DATA:  Pt c/o central chest pain that radiates down left arm. Pt seen pcp yesterday for the same and diagnosed with acid reflux. Pt still having chest pain with no relief.  Pt has hx of hypertension and COPD. EXAM: PORTABLE CHEST - 1 VIEW COMPARISON:  12/28/2014 FINDINGS: Lungs are clear. No effusion.  No pneumothorax. Heart size upper limits normal. Atheromatous mildly tortuous thoracic aorta. Visualized bones unremarkable. IMPRESSION: 1. No acute disease. 2.  Aortic Atherosclerosis (ICD10-170.0) Electronically Signed   By: Lucrezia Europe M.D.   On: 10/01/2015 23:08   I have personally reviewed and evaluated these images and lab results as part of my medical decision-making.   EKG Interpretation   Date/Time:  Tuesday October 01 2015 22:44:44 EDT Ventricular Rate:  82 PR Interval:  168 QRS Duration: 78 QT Interval:  387 QTC Calculation: 452 R Axis:   71 Text Interpretation:  Sinus rhythm Probable left atrial enlargement  Anteroseptal infarct, age indeterminate No significant change was found  d/w Dr. Einar Gip Confirmed by Wyvonnia Dusky  MD, Zera Markwardt [phone]) on 10/01/2015  11:17:45 PM      MDM   Final diagnoses:  Chest pain, unspecified chest pain type   2 hours of L sided chest pain radiating into arm, nausea, and SOB.  Occurred at rest. Similar episodes intermittently for past month, saw PCP  yesterday.  Initial EKGs concerning for possible ST elevation inferiorly but similar to previous.  Multiple EKGs reviewed with STEMI doctor Ganji who feels EKGs are similar to previous and likely early repolarization. Dr. Einar Gip does not plan emergent cath unless troponin is positive.  Troponin negative. LFTs and lipase normal. Some improvement in pain after ASA and GI cocktail.   CXR negative.  Low suspicion for PE or aortic dissection. With negative troponins, Dr. Einar Gip does not feel patient needs emergent transfer tonight.   Chest pain r/o d/w Dr. Maudie Mercury.  CRITICAL CARE Performed by: Ezequiel Essex Total critical care time: 30 minutes Critical care time was exclusive of separately billable procedures and treating other patients. Critical care was necessary to treat or prevent imminent or life-threatening deterioration. Critical care was time spent personally by me on the following activities: development of treatment plan with patient and/or surrogate as well as nursing, discussions with consultants, evaluation of patient's response to treatment, examination of patient, obtaining history from patient or surrogate, ordering and performing treatments and interventions, ordering and review of laboratory studies, ordering and review of radiographic studies, pulse oximetry and re-evaluation of patient's condition.    I personally performed the services described in this documentation, which was scribed in my presence. The recorded information has been reviewed and is accurate.      Ezequiel Essex, MD 10/02/15 [phone]

## 2015-10-02 ENCOUNTER — Observation Stay (HOSPITAL_COMMUNITY): Payer: Medicare HMO

## 2015-10-02 ENCOUNTER — Encounter (HOSPITAL_COMMUNITY)
Admission: EM | Disposition: A | Discharge status: Home / Self Care | Payer: Self-pay | Source: Home / Self Care | Attending: Cardiovascular Disease

## 2015-10-02 ENCOUNTER — Encounter (HOSPITAL_COMMUNITY): Payer: Self-pay | Admitting: Internal Medicine

## 2015-10-02 DIAGNOSIS — E7800 Pure hypercholesterolemia, unspecified: Secondary | ICD-10-CM | POA: Diagnosis present

## 2015-10-02 DIAGNOSIS — R079 Chest pain, unspecified: Secondary | ICD-10-CM

## 2015-10-02 DIAGNOSIS — F17210 Nicotine dependence, cigarettes, uncomplicated: Secondary | ICD-10-CM | POA: Diagnosis present

## 2015-10-02 DIAGNOSIS — Z823 Family history of stroke: Secondary | ICD-10-CM | POA: Diagnosis not present

## 2015-10-02 DIAGNOSIS — J449 Chronic obstructive pulmonary disease, unspecified: Secondary | ICD-10-CM | POA: Diagnosis present

## 2015-10-02 DIAGNOSIS — R739 Hyperglycemia, unspecified: Secondary | ICD-10-CM | POA: Diagnosis present

## 2015-10-02 DIAGNOSIS — E785 Hyperlipidemia, unspecified: Secondary | ICD-10-CM | POA: Diagnosis present

## 2015-10-02 DIAGNOSIS — I2510 Atherosclerotic heart disease of native coronary artery without angina pectoris: Secondary | ICD-10-CM

## 2015-10-02 DIAGNOSIS — I237 Postinfarction angina: Secondary | ICD-10-CM | POA: Diagnosis present

## 2015-10-02 DIAGNOSIS — I255 Ischemic cardiomyopathy: Secondary | ICD-10-CM | POA: Diagnosis present

## 2015-10-02 DIAGNOSIS — I10 Essential (primary) hypertension: Secondary | ICD-10-CM | POA: Diagnosis present

## 2015-10-02 DIAGNOSIS — I214 Non-ST elevation (NSTEMI) myocardial infarction: Secondary | ICD-10-CM | POA: Diagnosis present

## 2015-10-02 DIAGNOSIS — Z720 Tobacco use: Secondary | ICD-10-CM | POA: Diagnosis not present

## 2015-10-02 DIAGNOSIS — Z79899 Other long term (current) drug therapy: Secondary | ICD-10-CM | POA: Diagnosis not present

## 2015-10-02 DIAGNOSIS — Z8249 Family history of ischemic heart disease and other diseases of the circulatory system: Secondary | ICD-10-CM | POA: Diagnosis not present

## 2015-10-02 DIAGNOSIS — I471 Supraventricular tachycardia: Secondary | ICD-10-CM | POA: Diagnosis not present

## 2015-10-02 HISTORY — PX: CARDIAC CATHETERIZATION: SHX172

## 2015-10-02 LAB — CBC WITH DIFFERENTIAL/PLATELET
Basophils Absolute: 0 10*3/uL (ref 0.0–0.1)
Basophils Relative: 0 %
EOS ABS: 0.2 10*3/uL (ref 0.0–0.7)
Eosinophils Relative: 3 %
HEMATOCRIT: 39.8 % (ref 39.0–52.0)
HEMOGLOBIN: 13.2 g/dL (ref 13.0–17.0)
LYMPHS ABS: 1 10*3/uL (ref 0.7–4.0)
Lymphocytes Relative: 13 %
MCH: 29.3 pg (ref 26.0–34.0)
MCHC: 33.2 g/dL (ref 30.0–36.0)
MCV: 88.2 fL (ref 78.0–100.0)
MONOS PCT: 13 %
Monocytes Absolute: 1.1 10*3/uL — ABNORMAL HIGH (ref 0.1–1.0)
NEUTROS ABS: 5.6 10*3/uL (ref 1.7–7.7)
NEUTROS PCT: 71 %
Platelets: 155 10*3/uL (ref 150–400)
RBC: 4.51 MIL/uL (ref 4.22–5.81)
RDW: 13.3 % (ref 11.5–15.5)
WBC: 7.9 10*3/uL (ref 4.0–10.5)

## 2015-10-02 LAB — CBC
HCT: 38.4 % — ABNORMAL LOW (ref 39.0–52.0)
HEMOGLOBIN: 13.2 g/dL (ref 13.0–17.0)
MCH: 30.8 pg (ref 26.0–34.0)
MCHC: 34.4 g/dL (ref 30.0–36.0)
MCV: 89.7 fL (ref 78.0–100.0)
Platelets: 164 10*3/uL (ref 150–400)
RBC: 4.28 MIL/uL (ref 4.22–5.81)
RDW: 13.3 % (ref 11.5–15.5)
WBC: 6.7 10*3/uL (ref 4.0–10.5)

## 2015-10-02 LAB — POCT ACTIVATED CLOTTING TIME: Activated Clotting Time: 257 seconds

## 2015-10-02 LAB — COMPREHENSIVE METABOLIC PANEL
ALBUMIN: 3.4 g/dL — AB (ref 3.5–5.0)
ALK PHOS: 66 U/L (ref 38–126)
ALT: 14 U/L — ABNORMAL LOW (ref 17–63)
ALT: 18 U/L (ref 17–63)
ANION GAP: 5 (ref 5–15)
ANION GAP: 9 (ref 5–15)
AST: 25 U/L (ref 15–41)
AST: 62 U/L — ABNORMAL HIGH (ref 15–41)
Albumin: 3.6 g/dL (ref 3.5–5.0)
Alkaline Phosphatase: 64 U/L (ref 38–126)
BUN: 22 mg/dL — ABNORMAL HIGH (ref 6–20)
BUN: 13 mg/dL (ref 6–20)
CALCIUM: 8.5 mg/dL — AB (ref 8.9–10.3)
CHLORIDE: 105 mmol/L (ref 101–111)
CHLORIDE: 102 mmol/L (ref 101–111)
CO2: 27 mmol/L (ref 22–32)
CO2: 23 mmol/L (ref 22–32)
Calcium: 8.6 mg/dL — ABNORMAL LOW (ref 8.9–10.3)
Creatinine, Ser: 0.72 mg/dL (ref 0.61–1.24)
Creatinine, Ser: 0.74 mg/dL (ref 0.61–1.24)
GFR calc Af Amer: >60 mL/min (ref >60)
GFR calc non Af Amer: >60 mL/min (ref >60)
GLUCOSE: 82 mg/dL (ref 65–99)
Glucose, Bld: 78 mg/dL (ref 65–99)
POTASSIUM: 4.1 mmol/L (ref 3.5–5.1)
Potassium: 4.3 mmol/L (ref 3.5–5.1)
SODIUM: 137 mmol/L (ref 135–145)
SODIUM: 134 mmol/L — AB (ref 135–145)
TOTAL PROTEIN: 6.2 g/dL — AB (ref 6.5–8.1)
Total Bilirubin: 0.6 mg/dL (ref 0.3–1.2)
Total Bilirubin: 0.9 mg/dL (ref 0.3–1.2)
Total Protein: 6.3 g/dL — ABNORMAL LOW (ref 6.5–8.1)

## 2015-10-02 LAB — HEPARIN LEVEL (UNFRACTIONATED): Heparin Unfractionated: 0.82 IU/mL — ABNORMAL HIGH (ref 0.30–0.70)

## 2015-10-02 LAB — LIPID PANEL
CHOLESTEROL: 122 mg/dL (ref 0–200)
HDL: 43 mg/dL (ref >40)
LDL CALC: 71 mg/dL (ref 0–99)
TRIGLYCERIDES: 42 mg/dL (ref <150)
Total CHOL/HDL Ratio: 2.8 RATIO
VLDL: 8 mg/dL (ref 0–40)

## 2015-10-02 LAB — MRSA PCR SCREENING: MRSA BY PCR: NEGATIVE

## 2015-10-02 LAB — I-STAT TROPONIN, ED: TROPONIN I, POC: 0.02 ng/mL (ref 0.00–0.08)

## 2015-10-02 LAB — TROPONIN I
TROPONIN I: 1.6 ng/mL — AB (ref <0.031)
Troponin I: 7.22 ng/mL (ref <0.031)
Troponin I: 6.41 ng/mL (ref <0.031)

## 2015-10-02 LAB — TSH: TSH: 4.076 u[IU]/mL (ref 0.350–4.500)

## 2015-10-02 LAB — PROTIME-INR
INR: 1.22 (ref 0.00–1.49)
PROTHROMBIN TIME: 15.6 s — AB (ref 11.6–15.2)

## 2015-10-02 LAB — POCT I-STAT TROPONIN I: Troponin i, poc: 0.02 ng/mL (ref 0.00–0.08)

## 2015-10-02 SURGERY — LEFT HEART CATH AND CORONARY ANGIOGRAPHY
Anesthesia: LOCAL

## 2015-10-02 MED ORDER — HEPARIN (PORCINE) IN NACL 2-0.9 UNIT/ML-% IJ SOLN
INTRAMUSCULAR | Status: DC | PRN
  Administered 2015-10-02: 10 mL via INTRA_ARTERIAL

## 2015-10-02 MED ORDER — SODIUM CHLORIDE 0.9 % IV SOLN: INTRAVENOUS | Status: DC

## 2015-10-02 MED ORDER — LIDOCAINE HCL (PF) 1 % IJ SOLN
INTRAMUSCULAR | Status: AC
  Filled 2015-10-02: qty 30

## 2015-10-02 MED ORDER — SODIUM CHLORIDE 0.9 % IV SOLN: 250.0000 mL | INTRAVENOUS | Status: DC | PRN

## 2015-10-02 MED ORDER — SODIUM CHLORIDE 0.9% FLUSH
3.0000 mL | Freq: Two times a day (BID) | INTRAVENOUS | Status: DC
  Administered 2015-10-02: 3 mL via INTRAVENOUS

## 2015-10-02 MED ORDER — CLOPIDOGREL BISULFATE 300 MG PO TABS
600.0000 mg | ORAL_TABLET | Freq: Once | ORAL | Status: AC
  Administered 2015-10-02: 600 mg via ORAL
  Filled 2015-10-02: qty 2

## 2015-10-02 MED ORDER — LIDOCAINE HCL (PF) 1 % IJ SOLN
INTRAMUSCULAR | Status: DC | PRN
  Administered 2015-10-02: 2 mL via INTRADERMAL

## 2015-10-02 MED ORDER — SODIUM CHLORIDE 0.9% FLUSH: 3.0000 mL | INTRAVENOUS | Status: DC | PRN

## 2015-10-02 MED ORDER — HEPARIN BOLUS VIA INFUSION
4000.0000 [IU] | Freq: Once | INTRAVENOUS | Status: AC
  Administered 2015-10-02: 4000 [IU] via INTRAVENOUS
  Filled 2015-10-02: qty 4000

## 2015-10-02 MED ORDER — ASPIRIN 81 MG PO CHEW: 324.0000 mg | CHEWABLE_TABLET | ORAL | Status: DC

## 2015-10-02 MED ORDER — SODIUM CHLORIDE 0.9 % WEIGHT BASED INFUSION: 1.0000 mL/kg/h | INTRAVENOUS | Status: DC

## 2015-10-02 MED ORDER — ACETAMINOPHEN 325 MG PO TABS
650.0000 mg | ORAL_TABLET | ORAL | Status: DC | PRN
  Administered 2015-10-03: 650 mg via ORAL
  Filled 2015-10-02: qty 2

## 2015-10-02 MED ORDER — METOPROLOL TARTRATE 12.5 MG HALF TABLET: 12.5000 mg | ORAL_TABLET | Freq: Two times a day (BID) | ORAL | Status: DC

## 2015-10-02 MED ORDER — IOPAMIDOL (ISOVUE-370) INJECTION 76%
INTRAVENOUS | Status: AC
  Filled 2015-10-02: qty 125

## 2015-10-02 MED ORDER — HEPARIN SODIUM (PORCINE) 1000 UNIT/ML IJ SOLN
INTRAMUSCULAR | Status: AC
  Filled 2015-10-02: qty 1

## 2015-10-02 MED ORDER — ENOXAPARIN SODIUM 40 MG/0.4ML ~~LOC~~ SOLN
40.0000 mg | SUBCUTANEOUS | Status: DC
  Administered 2015-10-02: 40 mg via SUBCUTANEOUS
  Filled 2015-10-02: qty 0.4

## 2015-10-02 MED ORDER — ASPIRIN 300 MG RE SUPP: 300.0000 mg | RECTAL | Status: DC

## 2015-10-02 MED ORDER — SODIUM CHLORIDE 0.9% FLUSH
3.0000 mL | Freq: Two times a day (BID) | INTRAVENOUS | Status: DC
  Administered 2015-10-02 – 2015-10-03 (×4): 3 mL via INTRAVENOUS

## 2015-10-02 MED ORDER — FENTANYL CITRATE (PF) 100 MCG/2ML IJ SOLN
INTRAMUSCULAR | Status: DC | PRN
  Administered 2015-10-02: 25 ug via INTRAVENOUS

## 2015-10-02 MED ORDER — NITROGLYCERIN 2 % TD OINT
0.5000 [in_us] | TOPICAL_OINTMENT | Freq: Three times a day (TID) | TRANSDERMAL | Status: DC
  Administered 2015-10-02 (×2): 0.5 [in_us] via TOPICAL
  Filled 2015-10-02: qty 30

## 2015-10-02 MED ORDER — SODIUM CHLORIDE 0.9% FLUSH
3.0000 mL | INTRAVENOUS | Status: DC | PRN
Start: 2015-10-02 — End: 2015-10-02

## 2015-10-02 MED ORDER — DILTIAZEM HCL ER COATED BEADS 180 MG PO CP24
180.0000 mg | ORAL_CAPSULE | Freq: Every day | ORAL | Status: DC
  Administered 2015-10-02: 180 mg via ORAL
  Filled 2015-10-02: qty 1

## 2015-10-02 MED ORDER — HEPARIN (PORCINE) IN NACL 2-0.9 UNIT/ML-% IJ SOLN
INTRAMUSCULAR | Status: DC | PRN
Start: 2015-10-02 — End: 2015-10-02
  Administered 2015-10-02: 1000 mL

## 2015-10-02 MED ORDER — HEPARIN (PORCINE) IN NACL 2-0.9 UNIT/ML-% IJ SOLN
INTRAMUSCULAR | Status: AC
  Filled 2015-10-02: qty 1000

## 2015-10-02 MED ORDER — ACETAMINOPHEN 650 MG RE SUPP: 650.0000 mg | Freq: Four times a day (QID) | RECTAL | Status: DC | PRN

## 2015-10-02 MED ORDER — HYDRALAZINE HCL 20 MG/ML IJ SOLN: 10.0000 mg | Freq: Four times a day (QID) | INTRAMUSCULAR | Status: DC | PRN

## 2015-10-02 MED ORDER — NITROGLYCERIN 2 % TD OINT
TOPICAL_OINTMENT | TRANSDERMAL | Status: AC
  Administered 2015-10-02: 0.5 [in_us] via TOPICAL
  Filled 2015-10-02: qty 1

## 2015-10-02 MED ORDER — ASPIRIN EC 325 MG PO TBEC
325.0000 mg | DELAYED_RELEASE_TABLET | Freq: Every day | ORAL | Status: DC
  Administered 2015-10-02: 325 mg via ORAL
  Filled 2015-10-02: qty 1

## 2015-10-02 MED ORDER — HEPARIN (PORCINE) IN NACL 100-0.45 UNIT/ML-% IJ SOLN
750.0000 [IU]/h | INTRAMUSCULAR | Status: DC
Start: 2015-10-02 — End: 2015-10-02

## 2015-10-02 MED ORDER — ACETAMINOPHEN 325 MG PO TABS
650.0000 mg | ORAL_TABLET | Freq: Four times a day (QID) | ORAL | Status: DC | PRN
  Administered 2015-10-02: 650 mg via ORAL
  Filled 2015-10-02: qty 2

## 2015-10-02 MED ORDER — HEPARIN (PORCINE) IN NACL 100-0.45 UNIT/ML-% IJ SOLN
850.0000 [IU]/h | INTRAMUSCULAR | Status: DC
  Administered 2015-10-02: 750 [IU]/h via INTRAVENOUS
  Filled 2015-10-02: qty 250

## 2015-10-02 MED ORDER — FAMOTIDINE IN NACL 20-0.9 MG/50ML-% IV SOLN
INTRAVENOUS | Status: AC
  Filled 2015-10-02: qty 50

## 2015-10-02 MED ORDER — VERAPAMIL HCL 2.5 MG/ML IV SOLN
INTRAVENOUS | Status: AC
  Filled 2015-10-02: qty 2

## 2015-10-02 MED ORDER — NITROGLYCERIN IN D5W 200-5 MCG/ML-% IV SOLN: 10.0000 ug/min | INTRAVENOUS | Status: DC

## 2015-10-02 MED ORDER — IOPAMIDOL (ISOVUE-370) INJECTION 76%
INTRAVENOUS | Status: DC | PRN
  Administered 2015-10-02: 120 mL via INTRA_ARTERIAL

## 2015-10-02 MED ORDER — FENTANYL CITRATE (PF) 100 MCG/2ML IJ SOLN
INTRAMUSCULAR | Status: AC
  Filled 2015-10-02: qty 2

## 2015-10-02 MED ORDER — ALBUTEROL (5 MG/ML) CONTINUOUS INHALATION SOLN: 3.0000 mL | INHALATION_SOLUTION | RESPIRATORY_TRACT | Status: DC | PRN

## 2015-10-02 MED ORDER — NITROGLYCERIN 0.4 MG SL SUBL: 0.4000 mg | SUBLINGUAL_TABLET | SUBLINGUAL | Status: DC | PRN

## 2015-10-02 MED ORDER — HEPARIN SODIUM (PORCINE) 1000 UNIT/ML IJ SOLN
INTRAMUSCULAR | Status: DC | PRN
  Administered 2015-10-02: 4000 [IU] via INTRAVENOUS
  Administered 2015-10-02: 3000 [IU] via INTRAVENOUS

## 2015-10-02 MED ORDER — CARVEDILOL 12.5 MG PO TABS: 6.2500 mg | ORAL_TABLET | Freq: Two times a day (BID) | ORAL | Status: DC

## 2015-10-02 MED ORDER — ASPIRIN EC 81 MG PO TBEC
81.0000 mg | DELAYED_RELEASE_TABLET | Freq: Every day | ORAL | Status: DC
Start: 2015-10-03 — End: 2015-10-04
  Administered 2015-10-03 – 2015-10-04 (×2): 81 mg via ORAL
  Filled 2015-10-02 (×2): qty 1

## 2015-10-02 MED ORDER — CLOPIDOGREL BISULFATE 75 MG PO TABS
75.0000 mg | ORAL_TABLET | Freq: Every day | ORAL | Status: DC
  Administered 2015-10-03 – 2015-10-04 (×2): 75 mg via ORAL
  Filled 2015-10-02 (×2): qty 1

## 2015-10-02 MED ORDER — HEPARIN (PORCINE) IN NACL 100-0.45 UNIT/ML-% IJ SOLN
850.0000 [IU]/h | INTRAMUSCULAR | Status: DC
  Administered 2015-10-02: 850 [IU]/h via INTRAVENOUS
  Filled 2015-10-02: qty 250

## 2015-10-02 MED ORDER — SODIUM CHLORIDE 0.9 % WEIGHT BASED INFUSION: 3.0000 mL/kg/h | INTRAVENOUS | Status: DC

## 2015-10-02 MED ORDER — METOPROLOL TARTRATE 25 MG PO TABS
25.0000 mg | ORAL_TABLET | Freq: Two times a day (BID) | ORAL | Status: DC
  Administered 2015-10-02 – 2015-10-03 (×3): 25 mg via ORAL
  Filled 2015-10-02 (×3): qty 1

## 2015-10-02 MED ORDER — NITROGLYCERIN 1 MG/10 ML FOR IR/CATH LAB
INTRA_ARTERIAL | Status: DC | PRN
  Administered 2015-10-02: 150 ug via INTRACORONARY

## 2015-10-02 MED ORDER — ONDANSETRON HCL 4 MG/2ML IJ SOLN: 4.0000 mg | Freq: Four times a day (QID) | INTRAMUSCULAR | Status: DC | PRN

## 2015-10-02 MED ORDER — MIDAZOLAM HCL 2 MG/2ML IJ SOLN
INTRAMUSCULAR | Status: DC | PRN
  Administered 2015-10-02: 1 mg via INTRAVENOUS

## 2015-10-02 MED ORDER — NITROGLYCERIN 1 MG/10 ML FOR IR/CATH LAB
INTRA_ARTERIAL | Status: AC
  Filled 2015-10-02: qty 10

## 2015-10-02 MED ORDER — PRAVASTATIN SODIUM 40 MG PO TABS
80.0000 mg | ORAL_TABLET | Freq: Every day | ORAL | Status: DC
  Administered 2015-10-02 – 2015-10-04 (×3): 80 mg via ORAL
  Filled 2015-10-02 (×3): qty 2

## 2015-10-02 MED ORDER — SODIUM CHLORIDE 0.9% FLUSH: 3.0000 mL | Freq: Two times a day (BID) | INTRAVENOUS | Status: DC

## 2015-10-02 MED ORDER — FAMOTIDINE IN NACL 20-0.9 MG/50ML-% IV SOLN
20.0000 mg | Freq: Two times a day (BID) | INTRAVENOUS | Status: DC
  Administered 2015-10-02 (×3): 20 mg via INTRAVENOUS
  Filled 2015-10-02 (×4): qty 50

## 2015-10-02 MED ORDER — SIMETHICONE 80 MG PO CHEW
80.0000 mg | CHEWABLE_TABLET | Freq: Four times a day (QID) | ORAL | Status: DC | PRN
  Administered 2015-10-02: 80 mg via ORAL
  Filled 2015-10-02 (×2): qty 1

## 2015-10-02 MED ORDER — SODIUM CHLORIDE 0.9 % IV SOLN
INTRAVENOUS | Status: DC
  Administered 2015-10-03: 09:00:00 via INTRAVENOUS

## 2015-10-02 MED ORDER — MIDAZOLAM HCL 2 MG/2ML IJ SOLN
INTRAMUSCULAR | Status: AC
  Filled 2015-10-02: qty 2

## 2015-10-02 SURGICAL SUPPLY — 18 items
CATH INFINITI 5 FR AR1 MOD (CATHETERS) ×1 IMPLANT
CATH INFINITI 5 FR JR5 (CATHETERS) ×1 IMPLANT
CATH INFINITI 5FR AL1 (CATHETERS) ×1 IMPLANT
CATH INFINITI 5FR MULTPACK ANG (CATHETERS) ×1 IMPLANT
CATH OPTICROSS 40MHZ (CATHETERS) ×1 IMPLANT
CATH VISTA GUIDE 6FR XBLAD3.5 (CATHETERS) ×1 IMPLANT
DEVICE RAD COMP TR BAND LRG (VASCULAR PRODUCTS) ×1 IMPLANT
GLIDESHEATH SLEND SS 6F .021 (SHEATH) ×1 IMPLANT
KIT ESSENTIALS PG (KITS) ×1 IMPLANT
KIT HEART LEFT (KITS) ×2 IMPLANT
PACK CARDIAC CATHETERIZATION (CUSTOM PROCEDURE TRAY) ×2 IMPLANT
SLED PULL BACK IVUS (MISCELLANEOUS) ×1 IMPLANT
SYR MEDRAD MARK V 150ML (SYRINGE) ×2 IMPLANT
TRANSDUCER W/STOPCOCK (MISCELLANEOUS) ×2 IMPLANT
TUBING CIL FLEX 10 FLL-RA (TUBING) ×2 IMPLANT
WIRE COUGAR XT STRL 190CM (WIRE) ×1 IMPLANT
WIRE HI TORQ VERSACORE-J 145CM (WIRE) ×1 IMPLANT
WIRE SAFE-T 1.5MM-J .035X260CM (WIRE) ×1 IMPLANT

## 2015-10-02 NOTE — Progress Notes (Signed)
ANTICOAGULATION CONSULT NOTE - Follow Up Consult  Pharmacy Consult for Heparin Indication: chest pain/ACS/NSTEMI/CAD  No Known Allergies  Patient Measurements: Height: 6\' 2"  (188 cm) Weight: 161 lb 2.5 oz (73.1 kg) IBW/kg (Calculated) : 82.2 Heparin Dosing Weight: 73.1 kg  Vital Signs: Temp: 98.2 F (36.8 C) (06/21 1709) Temp Source: Oral (06/21 1709) BP: 137/70 mmHg (06/21 1900) Pulse Rate: 74 (06/21 1900)  Labs:  Recent Labs  10/01/15 2238 10/02/15 0332 10/02/15 1204 10/02/15 1700  HGB 16.3 13.2  --  13.2  HCT 47.3 38.4*  --  39.8  PLT 184 164  --  155  APTT 26  --   --   --   LABPROT 13.5  --   --  15.6*  INR 1.01  --   --  1.22  HEPARINUNFRC  --   --  0.82*  --   CREATININE 0.84 0.72  --  0.74  TROPONINI <0.03 1.60*  --  7.22*    Estimated Creatinine Clearance: 87.6 mL/min (by C-G formula based on Cr of 0.74).  Assessment:  s/p cardiac cath.  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  Heparin drip to resume 4 hrs after TR band off.  TR band off ~5:30pm.  RN reports site is without bleeding or hematoma.   Last heparin level was above goal (0.82) on 850 units/hr, resulted just before heparin turned off for cardiac cath.  Goal of Therapy:  Heparin level 0.3-0.7 units/ml Monitor platelets by anticoagulation protocol: Yes   Plan:   Resume heparin drip at 9:30pm at 750 units/hr.  Heparin level and CBC in am, ~ 8hrs after resuming heparin.  Daily heparin level and CBC while on heparin.  Arty Baumgartner, [place] Pager: [phone] 10/02/2015,7:19 PM

## 2015-10-02 NOTE — H&P (Signed)
Thomas Mosley is an 70 y.o. male.   Chief Complaint:Recurrent chest pain/elevated troponin I [name] is 70 year old male with past medical history significant for hypertension, hyperlipidemia, tobacco abuse, strong family history of coronary artery disease, complaining of retrosternal chest pain off and on for the last 2 weeks to Pepto-Bismol with partial relief.  Yesterday pain got worse, so decided to go to ER.  States chest pain radiated to the left arm , rated 10 over 10, relieved with sublingual nitroglycerin and aspirin.  Patient denies any nausea, vomiting, diaphoresis.  Denies any shortness of breath.  Denies palpitations, lightheadedness or syncope.EKG done in the ER showed normal sinus rhythm withminimal ST elevation in inferior leadsand T-wave inversion in lead 1, aVL and with poor R-wave progression in V1 to V3 with T-wave inversion in V1, V2.  Patient was noted to have elevated troponin I of 1.60.  Patient is transferred to Thomas Mosley from any pain Mosley for further treatment.  Patient presently denies any chest pain.  Past Medical History  Diagnosis Date  . Hypertension   . Hypercholesterolemia   . COPD (chronic obstructive pulmonary disease) ([place])     not on home o2    Past Surgical History  Procedure Laterality Date  . Cataract extraction w/phaco  02/02/2011    Procedure: CATARACT EXTRACTION PHACO AND INTRAOCULAR LENS PLACEMENT (IOC);  Surgeon: Tonny Branch;  Location: [facility];  Service: Ophthalmology;  Laterality: Left;  CDE 12.33  . Colonoscopy  02/13/2004    [id number] polyp at 30 cm resected/normal rectum. 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   . Colonoscopy N/A 11/23/2012    Procedure: COLONOSCOPY;  Surgeon: Daneil Dolin, MD;  Location: [facility];  Service: Endoscopy;  Laterality: N/A;  12:15-rescheduled to 11:00am Darius Bump notified pt    Family History   Problem Relation Age of Onset  . Anesthesia problems Neg Hx   . Hypotension Neg Hx   . Malignant hyperthermia Neg Hx   . Pseudochol deficiency Neg Hx   . Colon cancer Neg Hx   . Heart attack Mother   . Stroke Father   . Heart attack Brother   . Heart attack Brother 55   Social History:  reports that he has quit smoking. His smoking use included Cigarettes. He has a 10 pack-year smoking history. He does not have any smokeless tobacco history on file. He reports that he drinks about 0.6 oz of alcohol per week. He reports that he does not use illicit drugs.  Allergies: No Known Allergies  Medications Prior to Admission  Medication Sig Dispense Refill  . diltiazem (CARDIZEM CD) 180 MG 24 hr capsule Take 180 mg by mouth daily.      . pantoprazole (PROTONIX) 40 MG tablet Take 40 mg by mouth daily.  12  . pravastatin (PRAVACHOL) 40 MG tablet Take 80 mg by mouth daily.        Results for orders placed or performed during the Mosley encounter of 10/01/15 (from the past 48 hour(s))  Basic metabolic panel     Status: Abnormal   Collection Time: 10/01/15 10:38 PM  Result Value Ref Range   Sodium 137 135 - 145 mmol/L   Potassium 4.1 3.5 - 5.1 mmol/L   Chloride 102 101 - 111 mmol/L   CO2 28 22 - 32 mmol/L   Glucose, Bld 157 (H) 65 - 99 mg/dL   BUN 18 6 - 20  mg/dL   Creatinine, Ser 0.84 0.61 - 1.24 mg/dL   Calcium 9.5 8.9 - 10.3 mg/dL   GFR calc non Af Amer >60 >60 mL/min   GFR calc Af Amer >60 >60 mL/min    Comment: (NOTE) The eGFR has been calculated using the CKD EPI equation. This calculation has not been validated in all clinical situations. eGFR's persistently <60 mL/min signify possible Chronic Kidney Disease.    Anion gap 7 5 - 15  CBC     Status: None   Collection Time: 10/01/15 10:38 PM  Result Value Ref Range   WBC 7.2 4.0 - 10.5 K/uL   RBC 5.25 4.22 - 5.81 MIL/uL   Hemoglobin 16.3 13.0 - 17.0 g/dL   HCT 47.3 39.0 - 52.0 %   MCV 90.1 78.0 - 100.0 fL   MCH 31.0 26.0  - 34.0 pg   MCHC 34.5 30.0 - 36.0 g/dL   RDW 13.2 11.5 - 15.5 %   Platelets 184 150 - 400 K/uL  Troponin I     Status: None   Collection Time: 10/01/15 10:38 PM  Result Value Ref Range   Troponin I <0.03 <0.031 ng/mL    Comment:        NO INDICATION OF MYOCARDIAL INJURY.   Differential     Status: Abnormal   Collection Time: 10/01/15 10:38 PM  Result Value Ref Range   Neutrophils Relative % 69 %   Neutro Abs 4.9 1.7 - 7.7 K/uL   Lymphocytes Relative 13 %   Lymphs Abs 1.0 0.7 - 4.0 K/uL   Monocytes Relative 15 %   Monocytes Absolute 1.1 (H) 0.1 - 1.0 K/uL   Eosinophils Relative 3 %   Eosinophils Absolute 0.2 0.0 - 0.7 K/uL   Basophils Relative 0 %   Basophils Absolute 0.0 0.0 - 0.1 K/uL  Protime-INR     Status: None   Collection Time: 10/01/15 10:38 PM  Result Value Ref Range   Prothrombin Time 13.5 11.6 - 15.2 seconds   INR 1.01 0.00 - 1.49  APTT     Status: None   Collection Time: 10/01/15 10:38 PM  Result Value Ref Range   aPTT 26 24 - 37 seconds  Lipid panel     Status: Abnormal   Collection Time: 10/01/15 10:38 PM  Result Value Ref Range   Cholesterol 169 0 - 200 mg/dL   Triglycerides 76 <150 mg/dL   HDL 53 >40 mg/dL   Total CHOL/HDL Ratio 3.2 RATIO   VLDL 15 0 - 40 mg/dL   LDL Cholesterol 101 (H) 0 - 99 mg/dL    Comment:        Total Cholesterol/HDL:CHD Risk Coronary Heart Disease Risk Table                     Men   Women  1/2 Average Risk   3.4   3.3  Average Risk       5.0   4.4  2 X Average Risk   9.6   7.1  3 X Average Risk  23.4   11.0        Use the calculated Patient Ratio above and the CHD Risk Table to determine the patient's CHD Risk.        ATP III CLASSIFICATION (LDL):  <100     mg/dL   Optimal  100-129  mg/dL   Near or Above  Optimal  130-159  mg/dL   Borderline  160-189  mg/dL   High  >190     mg/dL   Very High   Hepatic function panel     Status: Abnormal   Collection Time: 10/01/15 10:38 PM  Result Value Ref  Range   Total Protein 8.3 (H) 6.5 - 8.1 g/dL   Albumin 4.7 3.5 - 5.0 g/dL   AST 22 15 - 41 U/L   ALT 16 (L) 17 - 63 U/L   Alkaline Phosphatase 94 38 - 126 U/L   Total Bilirubin 0.7 0.3 - 1.2 mg/dL   Bilirubin, Direct 0.1 0.1 - 0.5 mg/dL   Indirect Bilirubin 0.6 0.3 - 0.9 mg/dL  Lipase, blood     Status: None   Collection Time: 10/01/15 10:38 PM  Result Value Ref Range   Lipase 24 11 - 51 U/L  TSH     Status: None   Collection Time: 10/01/15 10:38 PM  Result Value Ref Range   TSH 4.076 0.350 - 4.500 uIU/mL  I-stat troponin, ED     Status: None   Collection Time: 10/01/15 10:53 PM  Result Value Ref Range   Troponin i, poc 0.00 0.00 - 0.08 ng/mL   Comment 3            Comment: Due to the release kinetics of cTnI, a negative result within the first hours of the onset of symptoms does not rule out myocardial infarction with certainty. If myocardial infarction is still suspected, repeat the test at appropriate intervals.   I-stat troponin, ED     Status: None   Collection Time: 10/02/15 12:53 AM  Result Value Ref Range   Troponin i, poc 0.02 0.00 - 0.08 ng/mL   Comment 3            Comment: Due to the release kinetics of cTnI, a negative result within the first hours of the onset of symptoms does not rule out myocardial infarction with certainty. If myocardial infarction is still suspected, repeat the test at appropriate intervals.   POCT i-Stat troponin I     Status: None   Collection Time: 10/02/15 12:58 AM  Result Value Ref Range   Troponin i, poc 0.02 0.00 - 0.08 ng/mL   Comment 3            Comment: Due to the release kinetics of cTnI, a negative result within the first hours of the onset of symptoms does not rule out myocardial infarction with certainty. If myocardial infarction is still suspected, repeat the test at appropriate intervals.   MRSA PCR Screening     Status: None   Collection Time: 10/02/15  3:00 AM  Result Value Ref Range   MRSA by PCR NEGATIVE  NEGATIVE    Comment:        The GeneXpert MRSA Assay (FDA approved for NASAL specimens only), is one component of a comprehensive MRSA colonization surveillance program. It is not intended to diagnose MRSA infection nor to guide or monitor treatment for MRSA infections.   Comprehensive metabolic panel     Status: Abnormal   Collection Time: 10/02/15  3:32 AM  Result Value Ref Range   Sodium 137 135 - 145 mmol/L   Potassium 4.3 3.5 - 5.1 mmol/L   Chloride 105 101 - 111 mmol/L   CO2 27 22 - 32 mmol/L   Glucose, Bld 78 65 - 99 mg/dL   BUN 22 (H) 6 - 20 mg/dL   Creatinine, Ser  0.72 0.61 - 1.24 mg/dL   Calcium 8.5 (L) 8.9 - 10.3 mg/dL   Total Protein 6.3 (L) 6.5 - 8.1 g/dL   Albumin 3.6 3.5 - 5.0 g/dL   AST 25 15 - 41 U/L   ALT 14 (L) 17 - 63 U/L   Alkaline Phosphatase 66 38 - 126 U/L   Total Bilirubin 0.6 0.3 - 1.2 mg/dL   GFR calc non Af Amer >60 >60 mL/min   GFR calc Af Amer >60 >60 mL/min    Comment: (NOTE) The eGFR has been calculated using the CKD EPI equation. This calculation has not been validated in all clinical situations. eGFR's persistently <60 mL/min signify possible Chronic Kidney Disease.    Anion gap 5 5 - 15  CBC     Status: Abnormal   Collection Time: 10/02/15  3:32 AM  Result Value Ref Range   WBC 6.7 4.0 - 10.5 K/uL    Comment: CONSISTENT WITH PREVIOUS RESULT   RBC 4.28 4.22 - 5.81 MIL/uL   Hemoglobin 13.2 13.0 - 17.0 g/dL   HCT 38.4 (L) 39.0 - 52.0 %   MCV 89.7 78.0 - 100.0 fL   MCH 30.8 26.0 - 34.0 pg   MCHC 34.4 30.0 - 36.0 g/dL   RDW 13.3 11.5 - 15.5 %   Platelets 164 150 - 400 K/uL  Troponin I     Status: Abnormal   Collection Time: 10/02/15  3:32 AM  Result Value Ref Range   Troponin I 1.60 (HH) <0.031 ng/mL    Comment: CRITICAL RESULT CALLED TO, READ BACK BY AND VERIFIED WITH: PHILLIPS,C AT 5:20AM ON 10/02/15 BY FESTERMAN,C        POSSIBLE MYOCARDIAL ISCHEMIA. SERIAL TESTING RECOMMENDED.   Lipid panel     Status: None   Collection  Time: 10/02/15  3:32 AM  Result Value Ref Range   Cholesterol 122 0 - 200 mg/dL   Triglycerides 42 <150 mg/dL   HDL 43 >40 mg/dL   Total CHOL/HDL Ratio 2.8 RATIO   VLDL 8 0 - 40 mg/dL   LDL Cholesterol 71 0 - 99 mg/dL    Comment:        Total Cholesterol/HDL:CHD Risk Coronary Heart Disease Risk Table                     Men   Women  1/2 Average Risk   3.4   3.3  Average Risk       5.0   4.4  2 X Average Risk   9.6   7.1  3 X Average Risk  23.4   11.0        Use the calculated Patient Ratio above and the CHD Risk Table to determine the patient's CHD Risk.        ATP III CLASSIFICATION (LDL):  <100     mg/dL   Optimal  100-129  mg/dL   Near or Above                    Optimal  130-159  mg/dL   Borderline  160-189  mg/dL   High  >190     mg/dL   Very High    Dg Chest Port 1 View  10/01/2015  CLINICAL DATA:  Pt c/o central chest pain that radiates down left arm. Pt seen pcp yesterday for the same and diagnosed with acid reflux. Pt still having chest pain with no relief. Pt has hx of hypertension and COPD.  EXAM: PORTABLE CHEST - 1 VIEW COMPARISON:  12/28/2014 FINDINGS: Lungs are clear. No effusion.  No pneumothorax. Heart size upper limits normal. Atheromatous mildly tortuous thoracic aorta. Visualized bones unremarkable. IMPRESSION: 1. No acute disease. 2.  Aortic Atherosclerosis (ICD10-170.0) Electronically Signed   By: Lucrezia Europe M.D.   On: 10/01/2015 23:08    Review of Systems  Constitutional: Negative for fever and chills.  Respiratory: Negative for cough and hemoptysis.   Cardiovascular: Positive for chest pain. Negative for palpitations, orthopnea and claudication.  Gastrointestinal: Negative for nausea and vomiting.  Genitourinary: Negative for dysuria.  Neurological: Negative for dizziness and headaches.    Blood pressure 145/90, pulse 94, temperature 97 F (36.1 C), temperature source Oral, resp. rate 15, height '6\' 2"'$  (1.88 m), weight 73.1 kg (161 lb 2.5 oz), SpO2 100  %. Physical Exam  Constitutional: He is oriented to person, place, and time.  HENT:  Head: Normocephalic and atraumatic.  Eyes: Conjunctivae are normal. Left eye exhibits no discharge. No scleral icterus.  Neck: Neck supple. No tracheal deviation present. No thyromegaly present.  Cardiovascular: Normal rate and regular rhythm.  Exam reveals gallop (S4 gallop noted).   Respiratory: Effort normal and breath sounds normal. No respiratory distress. He has no wheezes. He has no rales.  GI: Soft. Bowel sounds are normal. He exhibits no distension. There is no tenderness.  Musculoskeletal: He exhibits no edema or tenderness.  Neurological: He is alert and oriented to person, place, and time.     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 Hypertension. Hyperlipidemia. Tobacco abuse. Strong family history of coronary artery disease. Plan As per orders. Discussed with patient regarding left cardiac catheterization possible PTCA stenting it's risk and benefits, i.e., death, MI, stroke, need for emergency CABG, local vascular complications, etc., and consents for PCI.  Patient is presently chest pain-free.  We will schedule him for tomorrow.  Charolette Forward, MD 10/02/2015, 11:54 AM

## 2015-10-02 NOTE — Progress Notes (Signed)
DR  Callas OF TROPONIN 1.6 .I ]

## 2015-10-02 NOTE — ED Notes (Signed)
Dr. Maudie Mercury paged and made aware of pt's heart rate dropping to the low 40's. Dr. Maudie Mercury advised this RN that the pt could go up to ICU at this time and to discontinue the order for carvedilol.

## 2015-10-02 NOTE — H&P (Addendum)
TRH H&P   Patient Demographics:    Thomas Mosley, is a 70 y.o. male  MRN: [id number]   DOB - 1946/01/18  Admit Date - 10/01/2015  Outpatient Primary MD for the patient is Alonza Bogus, MD  Referring MD/NP/PA: Ezequiel Essex  Outpatient Specialists: none  Patient coming from: home  Chief Complaint  Patient presents with  . Chest Pain      HPI:    Thomas Mosley  is a 70 y.o. male, w Hypertension, hyperlipidemia, with c/o cp for the past 2-3 weeks. Which had improved with peptobismol.   This evening started about 7:30 pm,  Pt describes the pain as sharp and located on the upper mid chest, it was not improving and thus presented to ED. Denies fever chills, palp, sob, n/v, diarrhea, brbpr, black stool.  Nothing appears in particular to make the pain better or worse.    In ED EKG showed nsr at 80, nl axis, t inversion in v1, v2. Poor r progression,  Pt had repeat trop i was negative. Pt will be admitted for cp.     Review of systems:    In addition to the HPI above,  No Fever-chills, No Headache, No changes with Vision or hearing, No problems swallowing food or Liquids, No Cough or Shortness of Breath, No Abdominal pain, No Nausea or Vommitting, Bowel movements are regular, No Blood in stool or Urine, No dysuria, No new skin rashes or bruises, No new joints pains-aches,  No new weakness, tingling, numbness in any extremity, No recent weight gain or loss, No polyuria, polydypsia or polyphagia, No significant Mental Stressors.  A full 10 point Review of Systems was done, except as stated above, all other Review of Systems were negative.   With Past History of the following :    Past Medical History  Diagnosis Date  . Hypertension   . Hypercholesterolemia   . COPD (chronic obstructive pulmonary disease) ([place])     not on home o2      Past Surgical History   Procedure Laterality Date  . Cataract extraction w/phaco  02/02/2011    Procedure: CATARACT EXTRACTION PHACO AND INTRAOCULAR LENS PLACEMENT (IOC);  Surgeon: Tonny Branch;  Location: [facility];  Service: Ophthalmology;  Laterality: Left;  CDE 12.33  . Colonoscopy  02/13/2004    [id number] polyp at 30 cm resected/normal rectum. 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   . Colonoscopy N/A 11/23/2012    Procedure: COLONOSCOPY;  Surgeon: Daneil Dolin, MD;  Location: [facility];  Service: Endoscopy;  Laterality: N/A;  12:15-rescheduled to 11:00am Darius Bump notified pt      Social History:     Social History  Substance Use Topics  . Smoking status: Former Smoker -- 0.25 packs/day for 40 years    Types: Cigarettes  . Smokeless  tobacco: Not on file     Comment: smokes 1-2 cigarettes per day   . Alcohol Use: 0.6 oz/week    1 Standard drinks or equivalent per week     Comment: glass of wine about 3 days a week      Lives - at home  Mobility - ambulatory   Family History :     Family History  Problem Relation Age of Onset  . Anesthesia problems Neg Hx   . Hypotension Neg Hx   . Malignant hyperthermia Neg Hx   . Pseudochol deficiency Neg Hx   . Colon cancer Neg Hx   . Heart attack Mother   . Stroke Father   . Heart attack Brother   . Heart attack Brother 18      Home Medications:   Prior to Admission medications   Medication Sig Start Date End Date Taking? Authorizing Provider  albuterol (PROVENTIL HFA;VENTOLIN HFA) 108 (90 BASE) MCG/ACT inhaler Inhale 2 puffs into the lungs every 4 (four) hours as needed. 12/28/14   Rolland Porter, MD  azithromycin (ZITHROMAX) 250 MG tablet Take 2 po the first day then once a day for the next 4 days. 12/28/14   Rolland Porter, MD  diltiazem (CARDIZEM CD) 180 MG 24 hr capsule Take 180 mg by mouth daily.      Historical Provider, MD  HYDROcodone-acetaminophen  (NORCO/VICODIN) 5-325 MG per tablet Take 1-2 tablets by mouth every 6 (six) hours as needed for pain. 01/15/13   Fredia Sorrow, MD  polyethylene glycol-electrolytes (TRILYTE) 420 G solution Take 4,000 mLs by mouth as directed. 11/02/12   Daneil Dolin, MD  pravastatin (PRAVACHOL) 40 MG tablet Take 80 mg by mouth daily.      Historical Provider, MD  predniSONE (DELTASONE) 20 MG tablet Take 3 po QD x 3d , then 2 po QD x 3d then 1 po QD x 3d 12/28/14   Rolland Porter, MD     Allergies:    No Known Allergies   Physical Exam:   Vitals  Blood pressure 169/91, pulse 76, temperature 97.9 F (36.6 C), temperature source Oral, resp. rate 24, height 6\' 2"  (1.88 m), weight 74.844 kg (165 lb), SpO2 98 %.   1. General  lying in bed in NAD,    2. Normal affect and insight, Not Suicidal or Homicidal, Awake Alert, Oriented X 3.  3. No F.N deficits, ALL C.Nerves Intact, Strength 5/5 all 4 extremities, Sensation intact all 4 extremities, Plantars down going.  4. Ears and Eyes appear Normal, Conjunctivae clear, PERRLA. Moist Oral Mucosa.  5. Supple Neck, No JVD, No cervical lymphadenopathy appriciated, No Carotid Bruits.  6. Symmetrical Chest wall movement, Good air movement bilaterally, CTAB.  7. RRR, No Gallops, Rubs or Murmurs, No Parasternal Heave.  8. Positive Bowel Sounds, Abdomen Soft, No tenderness, No organomegaly appriciated,No rebound -guarding or rigidity.  9.  No Cyanosis, Normal Skin Turgor, No Skin Rash or Bruise.  10. Good muscle tone,  joints appear normal , no effusions, Normal ROM.  11. No Palpable Lymph Nodes in Neck or Axillae     Data Review:    CBC  Recent Labs Lab 10/01/15 2238  WBC 7.2  HGB 16.3  HCT 47.3  PLT 184  MCV 90.1  MCH 31.0  MCHC 34.5  RDW 13.2  LYMPHSABS 1.0  MONOABS 1.1*  EOSABS 0.2  BASOSABS 0.0   ------------------------------------------------------------------------------------------------------------------  Chemistries   Recent  Labs Lab 10/01/15 2238  NA 137  K 4.1  CL 102  CO2 28  GLUCOSE 157*  BUN 18  CREATININE 0.84  CALCIUM 9.5  AST 22  ALT 16*  ALKPHOS 94  BILITOT 0.7   ------------------------------------------------------------------------------------------------------------------ estimated creatinine clearance is 85.3 mL/min (by C-G formula based on Cr of 0.84). ------------------------------------------------------------------------------------------------------------------ No results for input(s): TSH, T4TOTAL, T3FREE, THYROIDAB in the last 72 hours.  Invalid input(s): FREET3  Coagulation profile  Recent Labs Lab 10/01/15 2238  INR 1.01   ------------------------------------------------------------------------------------------------------------------- No results for input(s): DDIMER in the last 72 hours. -------------------------------------------------------------------------------------------------------------------  Cardiac Enzymes  Recent Labs Lab 10/01/15 2238  TROPONINI <0.03   ------------------------------------------------------------------------------------------------------------------ No results found for: BNP   ---------------------------------------------------------------------------------------------------------------  Urinalysis No results found for: COLORURINE, APPEARANCEUR, LABSPEC, PHURINE, GLUCOSEU, HGBUR, BILIRUBINUR, KETONESUR, PROTEINUR, UROBILINOGEN, NITRITE, LEUKOCYTESUR  ----------------------------------------------------------------------------------------------------------------   Imaging Results:    Dg Chest Port 1 View  10/01/2015  CLINICAL DATA:  Pt c/o central chest pain that radiates down left arm. Pt seen pcp yesterday for the same and diagnosed with acid reflux. Pt still having chest pain with no relief. Pt has hx of hypertension and COPD. EXAM: PORTABLE CHEST - 1 VIEW COMPARISON:  12/28/2014 FINDINGS: Lungs are clear. No effusion.  No  pneumothorax. Heart size upper limits normal. Atheromatous mildly tortuous thoracic aorta. Visualized bones unremarkable. IMPRESSION: 1. No acute disease. 2.  Aortic Atherosclerosis (ICD10-170.0) Electronically Signed   By: Lucrezia Europe M.D.   On: 10/01/2015 23:08       Assessment & Plan:    Active Problems:   Chest pain   Hyperglycemia    1. Cp  Tele Trop i q6h x3 Check lipid Check cardiac echo NPO after mn Cardiology consultation ordered, appreciate input Cont aspirin, pravastatin, NTP, heparin Start on carvedilol for bp control GI consultation ordered for possible ? EGD Trial pepcid Trial gas x  2. Hyperglycemia Check hga1c  3. Hyperlipidemia Check lipid,  Cont pravastatin  DVT Prophylaxis Lovenox - SCDs   AM Labs Ordered, also please review Full Orders  Family Communication: Admission, patients condition and plan of care including tests being ordered have been discussed with the patient  who indicate understanding and agree with the plan and Code Status.   ADDENDUM:   Pt is chest pain free however trop positive Case discussed with Dr. Terrence Dupont and will transfer patient to [facility] CCU Notified pt and his wife that we would be transferring patient to [facility] ([facility])  Code Status FULL CODE  Likely DC to  home  Condition GUARDED    Consults called: cardiology and GI consultation ordered  Admission status: obs  Time spent in minutes : 45 minutes   Jani Gravel M.D on 10/02/2015 at 1:09 AM  Between 7am to 7pm - Pager - [phone]. After 7pm go to [email or web address] - password [facility]  [facility] - Office  [phone]

## 2015-10-02 NOTE — Progress Notes (Signed)
Patient ID: Thomas Mosley, male   DOB: 03-Dec-1945, 70 y.o.   MRN: [id number] Case d/w Dr. Terrence Dupont trransfer to [facility] for evaluation of ACS

## 2015-10-02 NOTE — Progress Notes (Signed)
ANTICOAGULATION CONSULT NOTE - Preliminary  Pharmacy Consult for heparin Indication: chest pain/ACS  No Known Allergies  Patient Measurements: Height: 6\' 2"  (188 cm) Weight: 161 lb 2.5 oz (73.1 kg) IBW/kg (Calculated) : 82.2 HEPARIN DW (KG): 73.1   Vital Signs: Temp: 97.3 F (36.3 C) (06/21 0400) Temp Source: Oral (06/21 0302) BP: 134/84 mmHg (06/21 0530) Pulse Rate: 54 (06/21 0530)  Labs:  Recent Labs  10/01/15 2238 10/02/15 0332  HGB 16.3 13.2  HCT 47.3 38.4*  PLT 184 164  APTT 26  --   LABPROT 13.5  --   INR 1.01  --   CREATININE 0.84 0.72  TROPONINI <0.03 1.60*   Estimated Creatinine Clearance: 87.6 mL/min (by C-G formula based on Cr of 0.72).  Medical History: Past Medical History  Diagnosis Date  . Hypertension   . Hypercholesterolemia   . COPD (chronic obstructive pulmonary disease) (HCC)     not on home o2    Medications:  Prescriptions prior to admission  Medication Sig Dispense Refill Last Dose  . albuterol (PROVENTIL HFA;VENTOLIN HFA) 108 (90 BASE) MCG/ACT inhaler Inhale 2 puffs into the lungs every 4 (four) hours as needed. 6.7 g 0   . azithromycin (ZITHROMAX) 250 MG tablet Take 2 po the first day then once a day for the next 4 days. 6 tablet 0   . diltiazem (CARDIZEM CD) 180 MG 24 hr capsule Take 180 mg by mouth daily.     11/22/2012 at 2300  . HYDROcodone-acetaminophen (NORCO/VICODIN) 5-325 MG per tablet Take 1-2 tablets by mouth every 6 (six) hours as needed for pain. 20 tablet 0   . polyethylene glycol-electrolytes (TRILYTE) 420 G solution Take 4,000 mLs by mouth as directed. 4000 mL 0 11/22/2012 at Unknown  . pravastatin (PRAVACHOL) 40 MG tablet Take 80 mg by mouth daily.     11/22/2012 at 2300  . predniSONE (DELTASONE) 20 MG tablet Take 3 po QD x 3d , then 2 po QD x 3d then 1 po QD x 3d 18 tablet 0    Scheduled:  . aspirin EC  325 mg Oral Daily  . diltiazem  180 mg Oral Daily  . enoxaparin (LOVENOX) injection  40 mg Subcutaneous Q24H  .  famotidine (PEPCID) IV  20 mg Intravenous Q12H  . heparin  4,000 Units Intravenous Once  . nitroGLYCERIN  0.5 inch Topical Q8H  . pravastatin  80 mg Oral Daily  . sodium chloride flush  3 mL Intravenous Q12H   Infusions:  . sodium chloride 20 mL/hr (10/02/15 0500)  . heparin     PRN: acetaminophen **OR** acetaminophen, albuterol, hydrALAZINE, simethicone Anti-infectives    None      Assessment: 70 yo admitted for chest pain. Troponin elevated, starting heparin.   Goal of Therapy:  Heparin level 0.3-0.7 units/ml   Plan:  Give 4000 units bolus x 1 Start heparin infusion at 850 units/hr Check anti-Xa level in 6 hours and daily while on heparin Continue to monitor H&H and platelets Preliminary review of pertinent patient information completed.  Forestine Na clinical pharmacist will complete review during morning rounds to assess the patient and finalize treatment regimen.  Nyra Capes, [facility] 10/02/2015,6:12 AM

## 2015-10-02 NOTE — Consult Note (Signed)
CARDIOLOGY CONSULT NOTE  Patient ID: Thomas Mosley, MRN: [id number], DOB/AGE: [birth year] 72 y.o. Admit date: 10/01/2015 Date of Consult: 10/02/2015  Primary Physician: Alonza Bogus, MD Primary Cardiologist: new  Referring Physician: Dr Luan Pulling  Chief Complaint: Chest pain Reason for Consultation: NSTEMI  HPI: 70 year old gentleman with a history of long-standing tobacco use, hypertension, and hyperlipidemia was transferred overnight with a non-ST elevation MI. He was initially seen by Dr Luan Pulling at [facility] and then transferred to [facility] under Dr Zenia Resides care. The patient's family requests that his care be transferred to our service and I am asked to see him in consultation.  The patient is currently chest pain-free. His wife and 2 daughters are at the bedside. He describes 2 weeks of intermittent chest discomfort that feels like a pressure in the center of the chest radiating into the left arm. He thought it was indigestion and this been taking medication for that. It has been helping intermittently. There is no associated shortness of breath, nausea, or vomiting. Yesterday evening his pain became severe and he went to the emergency department for evaluation. Before the last 2 weeks, the patient had no symptoms. He has been reasonably active and denies any previous exertional symptoms with exercise or physical work. The patient currently complains of a headache, likely due to nitroglycerin paste. He otherwise has no complaints.  He has cut back on smoking, but still smokes cigarettes at times. He reports a very strong family history of coronary artery disease. He had one brother die of an MI in his early 62s and another brother die of an MI in his 79s. His mother also had coronary disease.  Medical History:  Past Medical History  Diagnosis Date  . Hypertension   . Hypercholesterolemia   . COPD (chronic obstructive pulmonary disease) ([place])     not on home o2       Surgical History:  Past Surgical History  Procedure Laterality Date  . Cataract extraction w/phaco  02/02/2011    Procedure: CATARACT EXTRACTION PHACO AND INTRAOCULAR LENS PLACEMENT (IOC);  Surgeon: Tonny Branch;  Location: [facility] ORS;  Service: Ophthalmology;  Laterality: Left;  CDE 12.33  . Colonoscopy  02/13/2004    [id number] polyp at 30 cm resected/normal rectum. 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   . Colonoscopy N/A 11/23/2012    Procedure: COLONOSCOPY;  Surgeon: Daneil Dolin, MD;  Location: [facility] ENDO SUITE;  Service: Endoscopy;  Laterality: N/A;  12:15-rescheduled to 11:00am Darius Bump notified pt     Home Meds: Prior to Admission medications   Medication Sig Start Date End Date Taking? Authorizing Provider  diltiazem (CARDIZEM CD) 180 MG 24 hr capsule Take 180 mg by mouth daily.     Yes Historical Provider, MD  pantoprazole (PROTONIX) 40 MG tablet Take 40 mg by mouth daily. 09/30/15  Yes Historical Provider, MD  pravastatin (PRAVACHOL) 40 MG tablet Take 80 mg by mouth daily.     Yes Historical Provider, MD    Inpatient Medications:  . aspirin EC  325 mg Oral Daily  . diltiazem  180 mg Oral Daily  . famotidine (PEPCID) IV  20 mg Intravenous Q12H  . nitroGLYCERIN  0.5 inch Topical Q8H  . pravastatin  80 mg Oral Daily  . sodium chloride flush  3 mL Intravenous Q12H   . sodium chloride 10 mL/hr (10/02/15 1200)  . heparin 850 Units/hr (10/02/15 0900)  Allergies: No Known Allergies  Social History   Social History  . Marital Status: Married    Spouse Name: N/A  . Number of Children: N/A  . Years of Education: N/A   Occupational History  . Not on file.   Social History Main Topics  . Smoking status: Former Smoker -- 0.25 packs/day for 40 years    Types: Cigarettes  . Smokeless tobacco: Not on file     Comment: smokes 1-2 cigarettes per day   . Alcohol Use: 0.6 oz/week    1  Standard drinks or equivalent per week     Comment: glass of wine about 3 days a week   . Drug Use: No  . Sexual Activity: Yes   Other Topics Concern  . Not on file   Social History Narrative     Family History  Problem Relation Age of Onset  . Anesthesia problems Neg Hx   . Hypotension Neg Hx   . Malignant hyperthermia Neg Hx   . Pseudochol deficiency Neg Hx   . Colon cancer Neg Hx   . Heart attack Mother   . Stroke Father   . Heart attack Brother   . Heart attack Brother 62     Review of Systems: General: negative for chills, fever, night sweats or weight changes.  ENT: negative for rhinorrhea or epistaxis Cardiovascular: Negative for edema, orthopnea, PND, or heart palpitations  Dermatological: negative for rash Respiratory: negative for cough or wheezing, positive for shortness of breath with exertion GI: negative for nausea, vomiting, diarrhea, bright red blood per rectum, melena, or hematemesis GU: no hematuria, urgency, or frequency Neurologic: negative for visual changes, syncope, or dizziness. Positive for headache Heme: no easy bruising or bleeding Endo: negative for excessive thirst, thyroid disorder, or flushing Musculoskeletal: negative for joint pain or swelling, negative for myalgias  All other systems reviewed and are otherwise negative except as noted above.  Physical Exam: Blood pressure 149/85, pulse 64, temperature 97 F (36.1 C), temperature source Oral, resp. rate 15, height 6\' 2"  (1.88 m), weight 161 lb 2.5 oz (73.1 kg), SpO2 99 %. Pt is alert and oriented, WD, WN, thin male in no distress. HEENT: normal Neck: JVP normal. Carotid upstrokes normal without bruits. No thyromegaly. Lungs: equal expansion, clear bilaterally, prolonged expiratory phase CV: Apex is discrete and nondisplaced, RRR without murmur or gallop, distant heart sounds Abd: soft, NT, +BS, no bruit, no hepatosplenomegaly Back: no CVA tenderness Ext: no C/C/E        DP/PT pulses  intact and =        Radial pulses are 2+ on the left and 1+ on the right Skin: warm and dry without rash Neuro: CNII-XII intact             Strength intact = bilaterally    Labs:  Recent Labs  10/01/15 2238 10/02/15 0332  TROPONINI <0.03 1.60*   Lab Results  Component Value Date   WBC 6.7 10/02/2015   HGB 13.2 10/02/2015   HCT 38.4* 10/02/2015   MCV 89.7 10/02/2015   PLT 164 10/02/2015    Recent Labs Lab 10/02/15 0332  NA 137  K 4.3  CL 105  CO2 27  BUN 22*  CREATININE 0.72  CALCIUM 8.5*  PROT 6.3*  BILITOT 0.6  ALKPHOS 66  ALT 14*  AST 25  GLUCOSE 78   Lab Results  Component Value Date   CHOL 122 10/02/2015   HDL 43 10/02/2015   LDLCALC 71 10/02/2015  TRIG 42 10/02/2015   No results found for: DDIMER  Radiology/Studies:  Dg Chest Port 1 View  10/01/2015  CLINICAL DATA:  Pt c/o central chest pain that radiates down left arm. Pt seen pcp yesterday for the same and diagnosed with acid reflux. Pt still having chest pain with no relief. Pt has hx of hypertension and COPD. EXAM: PORTABLE CHEST - 1 VIEW COMPARISON:  12/28/2014 FINDINGS: Lungs are clear. No effusion.  No pneumothorax. Heart size upper limits normal. Atheromatous mildly tortuous thoracic aorta. Visualized bones unremarkable. IMPRESSION: 1. No acute disease. 2.  Aortic Atherosclerosis (ICD10-170.0) Electronically Signed   By: Lucrezia Europe M.D.   On: 10/01/2015 23:08    EKG: Normal sinus rhythm, age-indeterminate anteroseptal infarct, ST and T-wave abnormality consider lateral ischemia  Cardiac Studies: Pending  ASSESSMENT AND PLAN:  1. Non-ST elevation MI 2. Long-standing tobacco use 3. COPD, appears uncomplicated with no history of O2 dependence or hospitalization 4. Hypertension 5. Hyperlipidemia  The patient has non-ST elevation MI with chest pain, significant EKG changes, and elevated troponin. He is at high risk considering these factors along with multiple CV risk factors as outlined  above. I think he is at high risk of multivessel coronary disease with his family history and heavy tobacco use. Cardiac catheterization is clearly indicated. Anticipate left radial access as this pulse is much stronger than the right radial. He should be continued on aspirin and IV heparin until his arrival in the cardiac cath lab. I have reviewed the risks, indications, and alternatives to cardiac catheterization, possible angioplasty, and stenting with the patient. Risks include but are not limited to bleeding, infection, vascular injury, stroke, myocardial infection, arrhythmia, kidney injury, radiation-related injury in the case of prolonged fluoroscopy use, emergency cardiac surgery, and death. The patient understands the risks of serious complication is 1-2 in [id number] with diagnostic cardiac cath and 1-2% or less with angioplasty/stenting.   He will be treated with a high-intensity statin drug. Will start a beta-blocker pending cath results.   SignedSherren Mocha MD, [facility] 10/02/2015, 1:32 PM

## 2015-10-02 NOTE — Progress Notes (Signed)
He is here with chest pain. I saw him about 2 days ago in the office and at that time his symptoms sounded distinctly GI. He had been responding to medications like Tums. He was started on Protonix told that if he continued to have trouble to present to the emergency room. He did have trouble and presented to the emergency room and it looks like he's having an acute coronary event. He does have multiple cardiac risk factors. EKG in my office was normal during chest discomfort. Discussed with patient and his wife he is going to go to [facility] for probable cardiac catheterization.

## 2015-10-02 NOTE — Progress Notes (Signed)
Hooks for heparin Indication: chest pain/ACS  No Known Allergies  Patient Measurements: Height: 6\' 2"  (188 cm) Weight: 161 lb 2.5 oz (73.1 kg) IBW/kg (Calculated) : 82.2 HEPARIN DW (KG): 73.1   Vital Signs: Temp: 97 F (36.1 C) (06/21 0900) Temp Source: Oral (06/21 0900) BP: 149/85 mmHg (06/21 1200) Pulse Rate: 64 (06/21 1200)  Labs:  Recent Labs  10/01/15 2238 10/02/15 0332 10/02/15 1204  HGB 16.3 13.2  --   HCT 47.3 38.4*  --   PLT 184 164  --   APTT 26  --   --   LABPROT 13.5  --   --   INR 1.01  --   --   HEPARINUNFRC  --   --  0.82*  CREATININE 0.84 0.72  --   TROPONINI <0.03 1.60*  --    Estimated Creatinine Clearance: 87.6 mL/min (by C-G formula based on Cr of 0.72).  Medical History: Past Medical History  Diagnosis Date  . Hypertension   . Hypercholesterolemia   . COPD (chronic obstructive pulmonary disease) (HCC)     not on home o2    Medications:  Prescriptions prior to admission  Medication Sig Dispense Refill Last Dose  . diltiazem (CARDIZEM CD) 180 MG 24 hr capsule Take 180 mg by mouth daily.     09/30/2015  . pantoprazole (PROTONIX) 40 MG tablet Take 40 mg by mouth daily.  12 10/01/2015 at Unknown time  . pravastatin (PRAVACHOL) 40 MG tablet Take 80 mg by mouth daily.     09/30/2015   Scheduled:   Infusions:  . sodium chloride 10 mL/hr (10/02/15 1200)  . heparin 850 Units/hr (10/02/15 0900)     Assessment: 70 yo admitted for chest pain. Troponin elevated, started heparin this morning before transfer to [facility].  Initial heparin level slightly over goal range on 850 units/hr, but also received lovenox 40 mg this AM at 0330, then started heparin gtt with bolus about 3 hrs later.    Currently heparin being turned off as patient is going to cath lab shortly.  Goal of Therapy:  Heparin level 0.3-0.7 units/ml   Plan:  1. Continue IV heparin at current rate (turning off momentarily to go to cath) 2.  F/u plans for heparin after cath lab.  Uvaldo Rising, BCPS  Clinical Pharmacist Pager [phone]  10/02/2015 1:17 PM

## 2015-10-02 NOTE — Care Management Note (Signed)
Case Management Note  Patient Details  Name: Thomas Mosley MRN: [id number] Date of Birth: [birth year]  Subjective/Objective:         Adm w ch pain, pos troponins           Action/Plan: lives w wife   Expected Discharge Date:               Expected Discharge Plan:  Home/Self Care  In-House Referral:     Discharge planning Services     Post Acute Care Choice:    Choice offered to:     DME Arranged:    DME Agency:     HH Arranged:    HH Agency:     Status of Service:     If discussed at [employer], dates discussed:    Additional Comments: ur review done  Lacretia Leigh, RN 10/02/2015, 11:43 AM

## 2015-10-02 NOTE — Progress Notes (Signed)
Pt & family requested to be under Dr. Antionette Char services.  Dr Burt Knack paged and notified and accepted and at bedside.  Dr. Terrence Dupont notified.  Emotional support given to pt & to family.

## 2015-10-03 ENCOUNTER — Encounter (HOSPITAL_COMMUNITY)
Admission: EM | Disposition: A | Discharge status: Home / Self Care | Payer: Self-pay | Source: Home / Self Care | Attending: Cardiovascular Disease

## 2015-10-03 ENCOUNTER — Inpatient Hospital Stay (HOSPITAL_COMMUNITY): Payer: Medicare HMO

## 2015-10-03 ENCOUNTER — Other Ambulatory Visit: Payer: Self-pay

## 2015-10-03 ENCOUNTER — Encounter (HOSPITAL_COMMUNITY): Payer: Self-pay | Admitting: Cardiovascular Disease

## 2015-10-03 DIAGNOSIS — I10 Essential (primary) hypertension: Secondary | ICD-10-CM

## 2015-10-03 DIAGNOSIS — I255 Ischemic cardiomyopathy: Secondary | ICD-10-CM

## 2015-10-03 DIAGNOSIS — Z720 Tobacco use: Secondary | ICD-10-CM

## 2015-10-03 DIAGNOSIS — R079 Chest pain, unspecified: Secondary | ICD-10-CM

## 2015-10-03 HISTORY — PX: CARDIAC CATHETERIZATION: SHX172

## 2015-10-03 LAB — LIPID PANEL
Cholesterol: 131 mg/dL (ref 0–200)
HDL: 42 mg/dL (ref >40)
LDL CALC: 75 mg/dL (ref 0–99)
TRIGLYCERIDES: 71 mg/dL (ref <150)
Total CHOL/HDL Ratio: 3.1 RATIO
VLDL: 14 mg/dL (ref 0–40)

## 2015-10-03 LAB — ECHOCARDIOGRAM COMPLETE
AVPHT: 453 ms
E/e' ratio: 9.74
EWDT: 324 ms
FS: 23 % — AB (ref 28–44)
Height: 74 in
IV/PV OW: 0.95
LA ID, A-P, ES: 31 mm
LA diam end sys: 31 mm
LA diam index: 1.6 cm/m2
LA vol A4C: 23.6 ml
LV PW d: 11.2 mm — AB (ref 0.6–1.1)
LV TDI E'LATERAL: 8.92
LV TDI E'MEDIAL: 4.79
LVEEAVG: 9.74
LVEEMED: 9.74
LVELAT: 8.92 cm/s
LVOT area: 3.8 cm2
LVOT diameter: 22 mm
Lateral S' vel: 11.2 cm/s
MV Dec: 324
MV pk E vel: 86.9 m/s
MVPG: 3 mmHg
MVPKAVEL: 124 m/s
TAPSE: 22.6 mm
Weight: 2553.81 oz

## 2015-10-03 LAB — POCT ACTIVATED CLOTTING TIME
Activated Clotting Time: 263 seconds
Activated Clotting Time: 235 seconds
Activated Clotting Time: 131 seconds

## 2015-10-03 LAB — CBC
HEMATOCRIT: 40.7 % (ref 39.0–52.0)
HEMOGLOBIN: 13.5 g/dL (ref 13.0–17.0)
MCH: 29.6 pg (ref 26.0–34.0)
MCHC: 33.2 g/dL (ref 30.0–36.0)
MCV: 89.3 fL (ref 78.0–100.0)
Platelets: 152 10*3/uL (ref 150–400)
RBC: 4.56 MIL/uL (ref 4.22–5.81)
RDW: 13.3 % (ref 11.5–15.5)
WBC: 6.9 10*3/uL (ref 4.0–10.5)

## 2015-10-03 LAB — BASIC METABOLIC PANEL
Anion gap: 7 (ref 5–15)
BUN: 12 mg/dL (ref 6–20)
CALCIUM: 8.9 mg/dL (ref 8.9–10.3)
CHLORIDE: 105 mmol/L (ref 101–111)
CO2: 27 mmol/L (ref 22–32)
CREATININE: 0.84 mg/dL (ref 0.61–1.24)
GFR calc non Af Amer: >60 mL/min (ref >60)
GLUCOSE: 85 mg/dL (ref 65–99)
Potassium: 4.6 mmol/L (ref 3.5–5.1)
Sodium: 139 mmol/L (ref 135–145)

## 2015-10-03 LAB — TROPONIN I: TROPONIN I: 4.85 ng/mL — AB (ref <0.031)

## 2015-10-03 LAB — HEPARIN LEVEL (UNFRACTIONATED): Heparin Unfractionated: 0.18 IU/mL — ABNORMAL LOW (ref 0.30–0.70)

## 2015-10-03 LAB — HEMOGLOBIN A1C
Hgb A1c MFr Bld: 4.9 % (ref 4.8–5.6)
MEAN PLASMA GLUCOSE: 94 mg/dL

## 2015-10-03 SURGERY — CORONARY STENT INTERVENTION

## 2015-10-03 MED ORDER — SODIUM CHLORIDE 0.9 % IV SOLN
INTRAVENOUS | Status: DC | PRN
  Administered 2015-10-03: 10 mL/h via INTRAVENOUS

## 2015-10-03 MED ORDER — CLOPIDOGREL BISULFATE 75 MG PO TABS
ORAL_TABLET | ORAL | Status: AC
  Filled 2015-10-03: qty 1

## 2015-10-03 MED ORDER — CLOPIDOGREL BISULFATE 75 MG PO TABS
ORAL_TABLET | ORAL | Status: DC | PRN
  Administered 2015-10-03: 75 mg via ORAL

## 2015-10-03 MED ORDER — SODIUM CHLORIDE 0.9 % IV SOLN: INTRAVENOUS | Status: DC

## 2015-10-03 MED ORDER — NITROGLYCERIN 1 MG/10 ML FOR IR/CATH LAB
INTRA_ARTERIAL | Status: AC
  Filled 2015-10-03: qty 10

## 2015-10-03 MED ORDER — HEPARIN SODIUM (PORCINE) 1000 UNIT/ML IJ SOLN
INTRAMUSCULAR | Status: AC
  Filled 2015-10-03: qty 1

## 2015-10-03 MED ORDER — NITROGLYCERIN 1 MG/10 ML FOR IR/CATH LAB
INTRA_ARTERIAL | Status: DC | PRN
  Administered 2015-10-03: 200 ug via INTRACORONARY

## 2015-10-03 MED ORDER — HEPARIN (PORCINE) IN NACL 2-0.9 UNIT/ML-% IJ SOLN
INTRAMUSCULAR | Status: AC
  Filled 2015-10-03: qty 1000

## 2015-10-03 MED ORDER — IOPAMIDOL (ISOVUE-370) INJECTION 76%
INTRAVENOUS | Status: AC
  Filled 2015-10-03: qty 125

## 2015-10-03 MED ORDER — VERAPAMIL HCL 2.5 MG/ML IV SOLN
INTRAVENOUS | Status: AC
  Filled 2015-10-03: qty 2

## 2015-10-03 MED ORDER — SODIUM CHLORIDE 0.9% FLUSH: 3.0000 mL | INTRAVENOUS | Status: DC | PRN

## 2015-10-03 MED ORDER — NITROGLYCERIN IN D5W 200-5 MCG/ML-% IV SOLN
INTRAVENOUS | Status: AC
  Filled 2015-10-03: qty 250

## 2015-10-03 MED ORDER — SODIUM CHLORIDE 0.9% FLUSH: 3.0000 mL | Freq: Two times a day (BID) | INTRAVENOUS | Status: DC

## 2015-10-03 MED ORDER — VERAPAMIL HCL 2.5 MG/ML IV SOLN
INTRAVENOUS | Status: AC
Start: 2015-10-03 — End: 2015-10-03
  Filled 2015-10-03: qty 2

## 2015-10-03 MED ORDER — HEPARIN (PORCINE) IN NACL 2-0.9 UNIT/ML-% IJ SOLN
INTRAMUSCULAR | Status: DC | PRN
  Administered 2015-10-03: 1000 mL

## 2015-10-03 MED ORDER — MIDAZOLAM HCL 2 MG/2ML IJ SOLN
INTRAMUSCULAR | Status: AC
  Filled 2015-10-03: qty 2

## 2015-10-03 MED ORDER — FENTANYL CITRATE (PF) 100 MCG/2ML IJ SOLN
INTRAMUSCULAR | Status: AC
  Filled 2015-10-03: qty 2

## 2015-10-03 MED ORDER — ANGIOPLASTY BOOK
Freq: Once | Status: AC
  Administered 2015-10-03: 22:00:00
  Filled 2015-10-03: qty 1

## 2015-10-03 MED ORDER — LOSARTAN POTASSIUM 50 MG PO TABS
50.0000 mg | ORAL_TABLET | Freq: Every day | ORAL | Status: DC
Start: 2015-10-03 — End: 2015-10-04
  Administered 2015-10-03 – 2015-10-04 (×2): 50 mg via ORAL
  Filled 2015-10-03 (×2): qty 1

## 2015-10-03 MED ORDER — LIDOCAINE HCL (PF) 1 % IJ SOLN
INTRAMUSCULAR | Status: AC
  Filled 2015-10-03: qty 30

## 2015-10-03 MED ORDER — SODIUM CHLORIDE 0.9 % IV SOLN: 250.0000 mL | INTRAVENOUS | Status: DC | PRN

## 2015-10-03 MED ORDER — LIDOCAINE HCL (PF) 1 % IJ SOLN
INTRAMUSCULAR | Status: DC | PRN
  Administered 2015-10-03: 22 mL via SUBCUTANEOUS

## 2015-10-03 MED ORDER — FAMOTIDINE 20 MG PO TABS
20.0000 mg | ORAL_TABLET | Freq: Two times a day (BID) | ORAL | Status: DC
  Administered 2015-10-03 – 2015-10-04 (×3): 20 mg via ORAL
  Filled 2015-10-03 (×3): qty 1

## 2015-10-03 MED ORDER — IOPAMIDOL (ISOVUE-370) INJECTION 76%
INTRAVENOUS | Status: DC | PRN
  Administered 2015-10-03: 125 mL via INTRAVENOUS

## 2015-10-03 MED ORDER — IOPAMIDOL (ISOVUE-370) INJECTION 76%
INTRAVENOUS | Status: AC
  Filled 2015-10-03: qty 50

## 2015-10-03 MED ORDER — HEPARIN SODIUM (PORCINE) 1000 UNIT/ML IJ SOLN
INTRAMUSCULAR | Status: DC | PRN
  Administered 2015-10-03: 7000 [IU] via INTRAVENOUS

## 2015-10-03 MED ORDER — HEART ATTACK BOUNCING BOOK
Freq: Once | Status: AC
  Administered 2015-10-03: 22:00:00
  Filled 2015-10-03: qty 1

## 2015-10-03 SURGICAL SUPPLY — 23 items
BALLN ~~LOC~~ EMERGE MR 3.5X12 (BALLOONS) ×2
BALLN ~~LOC~~ TREK RX 3.0X15 (BALLOONS) ×2
BALLOON ~~LOC~~ EMERGE MR 3.5X12 (BALLOONS) IMPLANT
BALLOON ~~LOC~~ TREK RX 3.0X15 (BALLOONS) IMPLANT
CATH ROTALINK PLUS 1.50MM (BURR) ×1 IMPLANT
CATH S G BIP PACING (SET/KITS/TRAYS/PACK) ×1 IMPLANT
CATH VISTA GUIDE 7FR JR4 SH (CATHETERS) ×1 IMPLANT
DEVICE CLOSURE PERCLS PRGLD 6F (VASCULAR PRODUCTS) IMPLANT
KIT ENCORE 26 ADVANTAGE (KITS) ×3 IMPLANT
KIT HEART LEFT (KITS) ×2 IMPLANT
LUBRICANT ROTAGLIDE 20CC VIAL (MISCELLANEOUS) ×1 IMPLANT
PACK CARDIAC CATHETERIZATION (CUSTOM PROCEDURE TRAY) ×2 IMPLANT
PERCLOSE PROGLIDE 6F (VASCULAR PRODUCTS) ×2
SHEATH PINNACLE 6F 10CM (SHEATH) ×1 IMPLANT
SHEATH PINNACLE 7F 10CM (SHEATH) ×1 IMPLANT
STENT SYNERGY DES 3.5X20 (Permanent Stent) ×1 IMPLANT
TRANSDUCER W/STOPCOCK (MISCELLANEOUS) ×2 IMPLANT
TUBING ART PRESS 72  MALE/FEM (TUBING) ×1
TUBING ART PRESS 72 MALE/FEM (TUBING) IMPLANT
TUBING CIL FLEX 10 FLL-RA (TUBING) ×2 IMPLANT
WIRE COUGAR XT STRL 190CM (WIRE) ×1 IMPLANT
WIRE EMERALD 3MM-J .035X150CM (WIRE) ×1 IMPLANT
WIRE ROTA FLOPPY .009X325CM (WIRE) ×1 IMPLANT

## 2015-10-03 NOTE — Progress Notes (Signed)
  Echocardiogram 2D Echocardiogram has been performed.  Johny Chess 10/03/2015, 9:59 AM

## 2015-10-03 NOTE — H&P (View-Only) (Signed)
    Subjective:  Denies CP or dyspnea   Objective:  Filed Vitals:   10/03/15 0400 10/03/15 0500 10/03/15 0600 10/03/15 0700  BP: 138/63  143/70   Pulse: 57 54 54 68  Temp:  98.1 F (36.7 C)    TempSrc:  Oral    Resp: 17 16 19 22   Height:      Weight:  159 lb 9.8 oz (72.4 kg)    SpO2: 95% 95% 96% 95%    Intake/Output from previous day:  Intake/Output Summary (Last 24 hours) at 10/03/15 0743 Last data filed at 10/03/15 0700  Gross per 24 hour  Intake 1086.97 ml  Output   1800 ml  Net -713.03 ml    Physical Exam: Physical exam: Well-developed well-nourished in no acute distress.  Skin is warm and dry.  HEENT is normal.  Neck is supple.  Chest with diminished BS throughout Cardiovascular exam is regular rate and rhythm.  Abdominal exam nontender or distended. No masses palpated. Extremities show no edema. Radial cath site with no hematoma neuro grossly intact    Lab Results: Basic Metabolic Panel:  Recent Labs  10/02/15 1700 10/03/15 0430  NA 134* 139  K 4.1 4.6  CL 102 105  CO2 23 27  GLUCOSE 82 85  BUN 13 12  CREATININE 0.74 0.84  CALCIUM 8.6* 8.9   CBC:  Recent Labs  10/01/15 2238  10/02/15 1700 10/03/15 0430  WBC 7.2  < > 7.9 6.9  NEUTROABS 4.9  --  5.6  --   HGB 16.3  < > 13.2 13.5  HCT 47.3  < > 39.8 40.7  MCV 90.1  < > 88.2 89.3  PLT 184  < > 155 152  < > = values in this interval not displayed. Cardiac Enzymes:  Recent Labs  10/02/15 1700 10/02/15 2212 10/03/15 0430  TROPONINI 7.22* 6.41* 4.85*     Assessment/Plan:  1 non-ST elevation myocardial infarction-plan is for PCI of RCA today. Continue aspirin, Plavix, heparin, statin, metoprolol and nitroglycerin. 2 tobacco abuse-patient counseled on discontinuing. 3 Ischemic cardiomyopathy-ejection fraction 45% by cardiac catheterization. Schedule echocardiogram. Add ARB. 4 hypertension-blood pressure mildly increased.Add Cozaar50 mg daily and follow.  Kirk Ruths 10/03/2015,  7:43 AM

## 2015-10-03 NOTE — Progress Notes (Signed)
ANTICOAGULATION CONSULT NOTE - Follow Up Consult  Pharmacy Consult for heparin Indication: NSTEMI/CAD  Labs:  Recent Labs  10/01/15 2238 10/02/15 0332 10/02/15 1204 10/02/15 1700 10/02/15 2212 10/03/15 0430  HGB 16.3 13.2  --  13.2  --  13.5  HCT 47.3 38.4*  --  39.8  --  40.7  PLT 184 164  --  155  --  152  APTT 26  --   --   --   --   --   LABPROT 13.5  --   --  15.6*  --   --   INR 1.01  --   --  1.22  --   --   HEPARINUNFRC  --   --  0.82*  --   --  0.18*  CREATININE 0.84 0.72  --  0.74  --   --   TROPONINI <0.03 1.60*  --  7.22* 6.41*  --      Assessment: 69yo male subtherapeutic on heparin after resumed at lower rate after cath; was above heparin level goal on 850 units/hr but this was more likely 2/2 large bolus and lab drawn early.  Goal of Therapy:  Heparin level 0.3-0.7 units/ml   Plan:  Will increase heparin gtt back to 850 units/hr (<2 units/kg/hr) and check level in 6hr.  Wynona Neat, PharmD, BCPS  10/03/2015,5:36 AM

## 2015-10-03 NOTE — Interval H&P Note (Signed)
Cath Lab Visit (complete for each Cath Lab visit)  Clinical Evaluation Leading to the Procedure:   ACS: Yes.    Non-ACS:    Anginal Classification: CCS IV  Anti-ischemic medical therapy: Minimal Therapy (1 class of medications)  Non-Invasive Test Results: No non-invasive testing performed  Prior CABG: No previous CABG      History and Physical Interval Note:  10/03/2015 11:53 AM  Thomas Mosley  has presented today for surgery, with the diagnosis of cad  The various methods of treatment have been discussed with the patient and family. After consideration of risks, benefits and other options for treatment, the patient has consented to  Procedure(s): Coronary Stent Intervention Rotablator (N/A) as a surgical intervention .  The patient's history has been reviewed, patient examined, no change in status, stable for surgery.  I have reviewed the patient's chart and labs.  Questions were answered to the patient's satisfaction.     Sherren Mocha

## 2015-10-03 NOTE — Progress Notes (Signed)
    Subjective:  Denies CP or dyspnea   Objective:  Filed Vitals:   10/03/15 0400 10/03/15 0500 10/03/15 0600 10/03/15 0700  BP: 138/63  143/70   Pulse: 57 54 54 68  Temp:  98.1 F (36.7 C)    TempSrc:  Oral    Resp: 17 16 19 22   Height:      Weight:  159 lb 9.8 oz (72.4 kg)    SpO2: 95% 95% 96% 95%    Intake/Output from previous day:  Intake/Output Summary (Last 24 hours) at 10/03/15 0743 Last data filed at 10/03/15 0700  Gross per 24 hour  Intake 1086.97 ml  Output   1800 ml  Net -713.03 ml    Physical Exam: Physical exam: Well-developed well-nourished in no acute distress.  Skin is warm and dry.  HEENT is normal.  Neck is supple.  Chest with diminished BS throughout Cardiovascular exam is regular rate and rhythm.  Abdominal exam nontender or distended. No masses palpated. Extremities show no edema. Radial cath site with no hematoma neuro grossly intact    Lab Results: Basic Metabolic Panel:  Recent Labs  10/02/15 1700 10/03/15 0430  NA 134* 139  K 4.1 4.6  CL 102 105  CO2 23 27  GLUCOSE 82 85  BUN 13 12  CREATININE 0.74 0.84  CALCIUM 8.6* 8.9   CBC:  Recent Labs  10/01/15 2238  10/02/15 1700 10/03/15 0430  WBC 7.2  < > 7.9 6.9  NEUTROABS 4.9  --  5.6  --   HGB 16.3  < > 13.2 13.5  HCT 47.3  < > 39.8 40.7  MCV 90.1  < > 88.2 89.3  PLT 184  < > 155 152  < > = values in this interval not displayed. Cardiac Enzymes:  Recent Labs  10/02/15 1700 10/02/15 2212 10/03/15 0430  TROPONINI 7.22* 6.41* 4.85*     Assessment/Plan:  1 non-ST elevation myocardial infarction-plan is for PCI of RCA today. Continue aspirin, Plavix, heparin, statin, metoprolol and nitroglycerin. 2 tobacco abuse-patient counseled on discontinuing. 3 Ischemic cardiomyopathy-ejection fraction 45% by cardiac catheterization. Schedule echocardiogram. Add ARB. 4 hypertension-blood pressure mildly increased.Add Cozaar50 mg daily and follow.  Kirk Ruths 10/03/2015,  7:43 AM

## 2015-10-03 NOTE — Progress Notes (Signed)
Site area: right groin  Site Prior to Removal:  Level 0  Pressure Applied For 16 MINUTES    Minutes Beginning at 1629  Manual:   Yes.    Patient Status During Pull:  Stable   Post Pull Groin Site:  Level 0  Post Pull Instructions Given:  Yes.    Post Pull Pulses Present:  Yes.    Dressing Applied:  Yes.    Comments:  Venous sheath pulled, patient tolerated well. No bruising, bleeding or hematoma. Redressed area of arterial site and venous site with gauze dressing and tegaderm. Frequent checks with no change in assessment.

## 2015-10-04 ENCOUNTER — Telehealth: Payer: Self-pay | Admitting: Cardiovascular Disease

## 2015-10-04 ENCOUNTER — Encounter (HOSPITAL_COMMUNITY): Payer: Self-pay | Admitting: Cardiovascular Disease

## 2015-10-04 DIAGNOSIS — Z720 Tobacco use: Secondary | ICD-10-CM

## 2015-10-04 DIAGNOSIS — I10 Essential (primary) hypertension: Secondary | ICD-10-CM

## 2015-10-04 LAB — BASIC METABOLIC PANEL
Anion gap: 7 (ref 5–15)
BUN: 12 mg/dL (ref 6–20)
CALCIUM: 8.8 mg/dL — AB (ref 8.9–10.3)
CO2: 26 mmol/L (ref 22–32)
CREATININE: 0.78 mg/dL (ref 0.61–1.24)
Chloride: 106 mmol/L (ref 101–111)
GFR calc Af Amer: >60 mL/min (ref >60)
GLUCOSE: 86 mg/dL (ref 65–99)
Potassium: 4 mmol/L (ref 3.5–5.1)
SODIUM: 139 mmol/L (ref 135–145)

## 2015-10-04 LAB — CBC
HCT: 39.4 % (ref 39.0–52.0)
Hemoglobin: 12.9 g/dL — ABNORMAL LOW (ref 13.0–17.0)
MCH: 29.1 pg (ref 26.0–34.0)
MCHC: 32.7 g/dL (ref 30.0–36.0)
MCV: 88.7 fL (ref 78.0–100.0)
Platelets: 145 10*3/uL — ABNORMAL LOW (ref 150–400)
RBC: 4.44 MIL/uL (ref 4.22–5.81)
RDW: 13.3 % (ref 11.5–15.5)
WBC: 7 10*3/uL (ref 4.0–10.5)

## 2015-10-04 MED ORDER — CLOPIDOGREL BISULFATE 75 MG PO TABS: 75.0000 mg | ORAL_TABLET | Freq: Every day | ORAL | Status: DC

## 2015-10-04 MED ORDER — METOPROLOL TARTRATE 25 MG PO TABS
50.0000 mg | ORAL_TABLET | Freq: Two times a day (BID) | ORAL | Status: DC
Start: 2015-10-04 — End: 2015-10-04
  Administered 2015-10-04: 10:00:00 50 mg via ORAL
  Filled 2015-10-04: qty 2

## 2015-10-04 MED ORDER — FENTANYL CITRATE (PF) 100 MCG/2ML IJ SOLN
INTRAMUSCULAR | Status: DC | PRN
  Administered 2015-10-03: 25 ug via INTRAVENOUS

## 2015-10-04 MED ORDER — METOPROLOL TARTRATE 50 MG PO TABS: 50.0000 mg | ORAL_TABLET | Freq: Two times a day (BID) | ORAL | Status: DC

## 2015-10-04 MED ORDER — PRAVASTATIN SODIUM 80 MG PO TABS: 80.0000 mg | ORAL_TABLET | Freq: Every day | ORAL | Status: DC

## 2015-10-04 MED ORDER — MIDAZOLAM HCL 2 MG/2ML IJ SOLN
INTRAMUSCULAR | Status: DC | PRN
  Administered 2015-10-03: 0.5 mg via INTRAVENOUS

## 2015-10-04 MED ORDER — NITROGLYCERIN 0.4 MG SL SUBL: 0.4000 mg | SUBLINGUAL_TABLET | SUBLINGUAL | Status: AC | PRN

## 2015-10-04 MED ORDER — ASPIRIN 81 MG PO TBEC: 81.0000 mg | DELAYED_RELEASE_TABLET | Freq: Every day | ORAL | Status: DC

## 2015-10-04 MED ORDER — LOSARTAN POTASSIUM 50 MG PO TABS: 50.0000 mg | ORAL_TABLET | Freq: Every day | ORAL | Status: DC

## 2015-10-04 MED FILL — Verapamil HCl IV Soln 2.5 MG/ML: INTRAVENOUS | Qty: 2 | Status: AC

## 2015-10-04 MED FILL — Nitroglycerin IV Soln 200 MCG/ML in D5W: INTRAVENOUS | Qty: 250 | Status: AC

## 2015-10-04 NOTE — Discharge Summary (Signed)
Discharge Summary    Patient ID: Thomas Mosley,  MRN: [id number], DOB/AGE: 72-Jun-1947 72 y.o.  Admit date: 10/01/2015 Discharge date: 10/04/2015  Primary Care Provider: Alonza Bogus Primary Cardiologist: Dr. Burt Knack  Discharge Diagnoses    Active Problems:   Acute non Q wave MI (myocardial infarction), initial episode of care [facility])   NSTEMI (non-ST elevated myocardial infarction) [facility])   Chest pain   Hyperglycemia   Essential hypertension   Tobacco abuse   Allergies No Known Allergies  Diagnostic Studies/Procedures    LHC: 10/03/2015  Conclusion     1st RPLB lesion, 60% stenosed.  Prox LAD lesion, 50% stenosed.  Prox Cx lesion, 100% stenosed.  Ost RCA to Prox RCA lesion, 90% stenosed. Post intervention, there is a 0% residual stenosis.  Successful PCI of the proximal RCA using rotational atherectomy and stenting (drug-eluting platform).  Recommend: DAPT with ASA and plavix at least 12 months. Aggressive medical therapy for risk reduction. Anticipate DC tomorrow if no complications arise.    Echo: 10/03/2015  Study Conclusions  - Left ventricle: The cavity size was normal. Wall thickness was  normal. Systolic function was normal. The estimated ejection  fraction was in the range of 55% to 60%. Doppler parameters are  consistent with abnormal left ventricular relaxation (grade 1  diastolic dysfunction). - Aortic valve: There was mild regurgitation. - Mitral valve: There was mild regurgitation. - Atrial septum: No defect or patent foramen ovale was identified. _____________   History of Present Illness     70 year old gentleman with a history of long-standing tobacco use, hypertension, and hyperlipidemia was transferred overnight with a non-ST elevation MI. He was initially seen by Dr Luan Pulling at [facility] and then transferred to [facility] under Dr Zenia Resides care. The patient's family requests that his care be transferred to our  service.  He describes 2 weeks of intermittent chest discomfort that feels like a pressure in the center of the chest radiating into the left arm. He thought it was indigestion and this been taking medication for that. It has been helping intermittently. There is no associated shortness of breath, nausea, or vomiting. 10/01/2015 evening his pain became severe and he went to the emergency department for evaluation. Before the last 2 weeks, the patient had no symptoms. He has been reasonably active and denies any previous exertional symptoms with exercise or physical work. The patient did complain of a headache, likely due to nitroglycerin paste. He otherwise has no complaints.  He has cut back on smoking, but still smokes cigarettes at times. He reports a very strong family history of coronary artery disease. He had one brother die of an MI in his early 61s and another brother die of an MI in his 74s. His mother also had coronary disease. He was admitted to our service as an NSTEMI started on ASA, high dose statin, and IV heparin with plans for cath.   Hospital Course     On 10/03/2015 he was taken to the Cath Lab with Dr. Burt Knack for a LHC. Cath showed 1st RPLB with 60% stenosis, Prox LAD with 50% stenosis, Proc Cx with 100% stenosis but collaterals present, and have a DES placed with atherectomy to 90% stenosed ost RCA-Prox RCA with 0% residual stenosis. Recommendations for DAPT with ASA and Plavix for at least 1 year. Post cath he was placed on BB and ARB for better blood pressure control.   On 10/04/2015 he reported feeling well post cath. Right femoral  cath site stable. Morning labs showed stable renal function, and Hgb. Did note some episodes of brief SVT and ST on telemetry. Plan to increased metoprolol from 25mg  BID to 50mg  BID , also for better BP control. He was able to walk with cardiac rehab and denied any chest pain or dyspnea. Smoking cessation was stressed, and he reports a plan to stop. I have  arranged for TOC appt next week.  _____________  Discharge Vitals Blood pressure 144/88, pulse 89, temperature 97.2 F (36.2 C), temperature source Oral, resp. rate 28, height 6\' 2"  (1.88 m), weight 158 lb 15.2 oz (72.1 kg), SpO2 95 %.  Filed Weights   10/02/15 2200 10/03/15 0500 10/04/15 0407  Weight: 162 lb 0.6 oz (73.5 kg) 159 lb 9.8 oz (72.4 kg) 158 lb 15.2 oz (72.1 kg)    Labs & Radiologic Studies    CBC  Recent Labs  10/01/15 2238  10/02/15 1700 10/03/15 0430 10/04/15 0531  WBC 7.2  < > 7.9 6.9 7.0  NEUTROABS 4.9  --  5.6  --   --   HGB 16.3  < > 13.2 13.5 12.9*  HCT 47.3  < > 39.8 40.7 39.4  MCV 90.1  < > 88.2 89.3 88.7  PLT 184  < > 155 152 145*  < > = values in this interval not displayed. Basic Metabolic Panel  Recent Labs  10/03/15 0430 10/04/15 0531  NA 139 139  K 4.6 4.0  CL 105 106  CO2 27 26  GLUCOSE 85 86  BUN 12 12  CREATININE 0.84 0.78  CALCIUM 8.9 8.8*   Liver Function Tests  Recent Labs  10/02/15 0332 10/02/15 1700  AST 25 62*  ALT 14* 18  ALKPHOS 66 64  BILITOT 0.6 0.9  PROT 6.3* 6.2*  ALBUMIN 3.6 3.4*    Recent Labs  10/01/15 2238  LIPASE 24   Cardiac Enzymes  Recent Labs  10/02/15 1700 10/02/15 2212 10/03/15 0430  TROPONINI 7.22* 6.41* 4.85*   Hemoglobin A1C  Recent Labs  10/01/15 2238  HGBA1C 4.9   Fasting Lipid Panel  Recent Labs  10/03/15 0430  CHOL 131  HDL 42  LDLCALC 75  TRIG 71  CHOLHDL 3.1   Thyroid Function Tests  Recent Labs  10/01/15 2238  TSH 4.076   _____________  Dg Chest Port 1 View  10/01/2015  CLINICAL DATA:  Pt c/o central chest pain that radiates down left arm. Pt seen pcp yesterday for the same and diagnosed with acid reflux. Pt still having chest pain with no relief. Pt has hx of hypertension and COPD. EXAM: PORTABLE CHEST - 1 VIEW COMPARISON:  12/28/2014 FINDINGS: Lungs are clear. No effusion.  No pneumothorax. Heart size upper limits normal. Atheromatous mildly tortuous  thoracic aorta. Visualized bones unremarkable. IMPRESSION: 1. No acute disease. 2.  Aortic Atherosclerosis (ICD10-170.0) Electronically Signed   By: Lucrezia Europe M.D.   On: 10/01/2015 23:08   Disposition   Pt is being discharged home today in good condition.  Follow-up Plans & Appointments    Follow-up Information    Follow up with Angelena Form, PA-C On 10/11/2015.   Specialties:  Cardiology, Radiology   Why:  9:30am with Gay Filler our pharmacist. 10am with Katie Dr. Antionette Char PA for your hospital follow up   Contact information:   [place] [address] [phone]      Discharge Instructions    Amb Referral to Cardiac Rehabilitation    Complete by:  As directed   Diagnosis:   NSTEMI Coronary Stents       Diet - low sodium heart healthy    Complete by:  As directed      Discharge instructions    Complete by:  As directed   Groin Site Care Refer to this sheet in the next few weeks. These instructions provide you with information on caring for yourself after your procedure. Your caregiver may also give you more specific instructions. Your treatment has been planned according to current medical practices, but problems sometimes occur. Call your caregiver if you have any problems or questions after your procedure. HOME CARE INSTRUCTIONS You may shower 24 hours after the procedure. Remove the bandage (dressing) and gently wash the site with plain soap and water. Gently pat the site dry.  Do not apply powder or lotion to the site.  Do not sit in a bathtub, swimming pool, or whirlpool for 5 to 7 days.  No bending, squatting, or lifting anything over 10 pounds (4.5 kg) as directed by your caregiver.  Inspect the site at least twice daily.  Do not drive home if you are discharged the same day of the procedure. Have someone else drive you.  You may drive 24 hours after the procedure unless otherwise instructed by your caregiver.  What to expect: Any bruising  will usually fade within 1 to 2 weeks.  Blood that collects in the tissue (hematoma) may be painful to the touch. It should usually decrease in size and tenderness within 1 to 2 weeks.  SEEK IMMEDIATE MEDICAL CARE IF: You have unusual pain at the groin site or down the affected leg.  You have redness, warmth, swelling, or pain at the groin site.  You have drainage (other than a small amount of blood on the dressing).  You have chills.  You have a fever or persistent symptoms for more than 72 hours.  You have a fever and your symptoms suddenly get worse.  Your leg becomes pale, cool, tingly, or numb.  You have heavy bleeding from the site. Hold pressure on the site. .     Increase activity slowly    Complete by:  As directed            Discharge Medications   Current Discharge Medication List    START taking these medications   Details  aspirin EC 81 MG EC tablet Take 1 tablet (81 mg total) by mouth daily.    clopidogrel (PLAVIX) 75 MG tablet Take 1 tablet (75 mg total) by mouth daily. Qty: 30 tablet, Refills: 11    losartan (COZAAR) 50 MG tablet Take 1 tablet (50 mg total) by mouth daily. Qty: 30 tablet, Refills: 11    metoprolol tartrate (LOPRESSOR) 50 MG tablet Take 1 tablet (50 mg total) by mouth 2 (two) times daily. Qty: 60 tablet, Refills: 11    nitroGLYCERIN (NITROSTAT) 0.4 MG SL tablet Place 1 tablet (0.4 mg total) under the tongue every 5 (five) minutes x 3 doses as needed for chest pain. Qty: 25 tablet, Refills: 3      CONTINUE these medications which have CHANGED   Details  pravastatin (PRAVACHOL) 80 MG tablet Take 1 tablet (80 mg total) by mouth daily. Qty: 30 tablet, Refills: 11      CONTINUE these medications which have NOT CHANGED   Details  diltiazem (CARDIZEM CD) 180 MG 24 hr capsule Take 180 mg by mouth daily.      pantoprazole (PROTONIX) 40 MG  tablet Take 40 mg by mouth daily. Refills: 12      STOP taking these medications     albuterol  (PROVENTIL HFA;VENTOLIN HFA) 108 (90 BASE) MCG/ACT inhaler          Aspirin prescribed at discharge?  Yes High Intensity Statin Prescribed? (Lipitor 40-80mg  or Crestor 20-40mg ): Yes Beta Blocker Prescribed? Yes For EF <40%, was ACEI/ARB Prescribed? Yes ADP Receptor Inhibitor Prescribed? (i.e. Plavix etc.-Includes Medically Managed Patients): Yes For EF <40%, Aldosterone Inhibitor Prescribed? No:  Was EF assessed during THIS hospitalization? Yes Was Cardiac Rehab II ordered? (Included Medically managed Patients): Yes   Outstanding Labs/Studies   None  Duration of Discharge Encounter   Greater than 30 minutes including physician time.  Signed, Reino Bellis NP-C 10/04/2015, 10:46 AM

## 2015-10-04 NOTE — Telephone Encounter (Signed)
I spoke with the pt's wife and made her aware of the pt's medications and what he should be taking from hospitalization discharge. I educated her about the different classes of drugs and how they work and the reason why they were prescribed. I also made her aware of post hospital follow-up appointments that are scheduled.   Patient contacted regarding discharge from [employer] on 10/04/15.  Patient understands to follow up with provider Angelena Form PA on 10/11/15 at 10:00 AM at [facility]. Pharmacy at 9:30 AM. Patient understands discharge instructions? yes Patient understands medications and regiment? Yes after speaking with the pt's wife and answering questions Patient understands to bring all medications to this visit? yes

## 2015-10-04 NOTE — Telephone Encounter (Signed)
TOC patient appt 10-11-15 -930a Gay Filler and 10a Katie- per Ria Comment

## 2015-10-04 NOTE — Progress Notes (Signed)
Patient Name: Thomas Mosley Date of Encounter: 10/04/2015  Hospital Problem List     Active Problems:   Chest pain   Hyperglycemia   Acute non Q wave MI (myocardial infarction), initial episode of care [facility])   NSTEMI (non-ST elevated myocardial infarction) ([place])    Subjective   Feels well this morning, without chest pain or dyspnea. Does have significant cough  Inpatient Medications    . aspirin EC  81 mg Oral Daily  . clopidogrel  75 mg Oral Daily  . famotidine  20 mg Oral BID  . losartan  50 mg Oral Daily  . metoprolol tartrate  25 mg Oral BID  . pravastatin  80 mg Oral Daily  . sodium chloride flush  3 mL Intravenous Q12H  . sodium chloride flush  3 mL Intravenous Q12H    Vital Signs    Filed Vitals:   10/03/15 1948 10/03/15 2000 10/03/15 2045 10/04/15 0407  BP: 153/80 157/81  153/77  Pulse: 79 73  75  Temp: 97.6 F (36.4 C)   97.1 F (36.2 C)  TempSrc: Oral   Axillary  Resp: 23 23  19   Height:      Weight:    158 lb 15.2 oz (72.1 kg)  SpO2: 93% 93% 90% 97%    Intake/Output Summary (Last 24 hours) at 10/04/15 0744 Last data filed at 10/04/15 0408  Gross per 24 hour  Intake   1614 ml  Output   2050 ml  Net   -436 ml   Filed Weights   10/02/15 2200 10/03/15 0500 10/04/15 0407  Weight: 162 lb 0.6 oz (73.5 kg) 159 lb 9.8 oz (72.4 kg) 158 lb 15.2 oz (72.1 kg)    Physical Exam    General: Pleasant older male, NAD. Neuro: Alert and oriented X 3. Moves all extremities spontaneously. Psych: Normal affect. HEENT:  Normal  Neck: Supple without bruits or JVD. Lungs:  Resp regular and unlabored, Diminished bilaterally. Heart: RRR no s3, s4, or murmurs. Abdomen: Soft, non-tender, non-distended, BS + x 4.  Extremities: No clubbing, cyanosis or edema. DP/PT/Radials 2+ and equal bilaterally. RIght femoral cath site stable, without hematoma or bruit.   Labs    CBC  Recent Labs  10/01/15 2238  10/02/15 1700 10/03/15 0430 10/04/15 0531  WBC 7.2  < >  7.9 6.9 7.0  NEUTROABS 4.9  --  5.6  --   --   HGB 16.3  < > 13.2 13.5 12.9*  HCT 47.3  < > 39.8 40.7 39.4  MCV 90.1  < > 88.2 89.3 88.7  PLT 184  < > 155 152 145*  < > = values in this interval not displayed. Basic Metabolic Panel  Recent Labs  10/03/15 0430 10/04/15 0531  NA 139 139  K 4.6 4.0  CL 105 106  CO2 27 26  GLUCOSE 85 86  BUN 12 12  CREATININE 0.84 0.78  CALCIUM 8.9 8.8*   Liver Function Tests  Recent Labs  10/02/15 0332 10/02/15 1700  AST 25 62*  ALT 14* 18  ALKPHOS 66 64  BILITOT 0.6 0.9  PROT 6.3* 6.2*  ALBUMIN 3.6 3.4*    Recent Labs  10/01/15 2238  LIPASE 24   Cardiac Enzymes  Recent Labs  10/02/15 1700 10/02/15 2212 10/03/15 0430  TROPONINI 7.22* 6.41* 4.85*   Hemoglobin A1C  Recent Labs  10/01/15 2238  HGBA1C 4.9   Fasting Lipid Panel  Recent Labs  10/03/15 0430  CHOL 131  HDL 42  LDLCALC 75  TRIG 71  CHOLHDL 3.1   Thyroid Function Tests  Recent Labs  10/01/15 2238  TSH 4.076    Telemetry    ST, some intermittent episodes of SVT  ECG    SR with T wave changes showing completed inferior MI, no change from last tracing.  Radiology     Assessment & Plan    70 year old gentleman with a history of long-standing tobacco use, hypertension, and hyperlipidemia was transferred overnight with a non-ST elevation MI. He was initially seen by Dr Luan Pulling at [facility] and then transferred to [facility] under Dr Zenia Resides care. Reported 2 weeks of intermittent chest pain. Presented with NSTEMI. Underwent LHC with Dr. Burt Knack yesterday afternoon.   1. NSTEMI: LHC yesterday showed 1st RPLB 60% stenosed lesion, Prox LAD 50% lesion, Prox Cx lesion 100% stenosed with collateral flow, and Ost RCA to Prox RCA 90% with DES and rotational atherectomy with 0% residual stenosis. Recommendations for DAPT with ASA and Plavix for at least 12 months. --Waiting to walk with cardiac rehab this morning.  --Has been tachy on  telemetry with some episodes of SVT, currently on metoprolol 25mg  BID, may need to increase for BP and HR.  --Cr and Hgb stable.  2. HTN: Cozaar 50mg  daily added yesterday. Still borderline controlled this morning. Reports a hacking cough this morning?   3. HLD: On statin  4. Tobacco abuse: counseled on cessation  Signed, Reino Bellis NP-C Pager [phone] As above; pt seen and examined; no chest pain or dyspnea; right groin with no hematoma or bruit; plan DC today on present meds; FU with Dr Burt Knack 2-4 weeks; increase metoprolol to 50 BID and watch BP and advance meds as needed; pt counseled on DCing tobacco. > 30 min PA and physician time D2

## 2015-10-04 NOTE — Telephone Encounter (Signed)
Follow up      Pt was released from hosp this am.  Pt is confused about taking two bp medications (losartan and metoprolol).  He does not want to take 2 bp meds.  They have not filled these prescriptions.

## 2015-10-04 NOTE — Progress Notes (Signed)
CARDIAC REHAB PHASE I   PRE:  Rate/Rhythm: 110 ST  BP:  Supine:   Sitting: 144/88  Standing:    SaO2:  MODE:  Ambulation: 800 ft   POST:  Rate/Rhythm: 126 ST  BP:  Supine:   Sitting: 132/73  Standing:    SaO2:  0820-0915 Pt walked 800 ft with steady gait. No CP. Tolerated well. MI education completed with pt and family who voiced understanding. Stressed importance of plavix with stent. Reviewed NTG use, ex ed, heart healthy diet, risk factors, MI restrictions. Gave smoking cessation handout. Did not want fake cigarette. Is going to quit cold [place]. Quit once for three years. Referring to [place] Phase 2.   Graylon Good, RN BSN  10/04/2015 9:11 AM

## 2015-10-04 NOTE — Telephone Encounter (Signed)
Follow-up     The patient and the pt's wife is confused about the all the medication the pt was discharged on from the hospital, not sure what to take or not take the wif wants to speak with a nurse.

## 2015-10-07 ENCOUNTER — Other Ambulatory Visit (HOSPITAL_COMMUNITY): Payer: Self-pay | Admitting: Pulmonary Disease

## 2015-10-07 ENCOUNTER — Telehealth: Payer: Self-pay | Admitting: Cardiovascular Disease

## 2015-10-07 DIAGNOSIS — R109 Unspecified abdominal pain: Secondary | ICD-10-CM

## 2015-10-07 NOTE — Telephone Encounter (Signed)
New Message:   Pt had a stent put in last Thursday. He wants to know if he can do his lawn this Wednesday,using his riding [employer], nature?

## 2015-10-07 NOTE — Telephone Encounter (Signed)
Left message to call back  

## 2015-10-10 NOTE — Telephone Encounter (Signed)
No answer

## 2015-10-10 NOTE — Progress Notes (Signed)
Cardiology Office Note    Date:  10/11/2015   ID:  Thomas Mosley, DOB 03-06-1946, MRN [id number]  PCP:  Alonza Bogus, MD  Cardiologist:  Dr. Burt Knack  CC: Post hospital follow up   History of Present Illness:  Thomas Mosley is a 70 y.o. male with a history of long-standing tobacco use, HTN, HLD and CAD s/p DES/arthrectomy of ost-prox RCA (10/03/15) and SVT who presents to clinic for post post hospital follow up  He was recently admitted to [facility] 6/20-6/23/17. He was transferred from [facility] with chest pain. He ruled in for NSTEMI. On 10/03/2015 he was taken to the Cath Lab with Dr. Burt Knack for a LHC. Cath showed 1st RPLB with 60% stenosis, Prox LAD with 50% stenosis, Proc Cx with 100% stenosis but collaterals present, and had a DES placed with atherectomy to 90% stenosed ost RCA-Prox RCA with 0% residual stenosis. He was started on DAPT with ASA/Plavix for at least 1 year. Post cath he was placed on BB and ARB for better blood pressure control. Post cath, they did note episodes of brief SVT and ST on telemetry. Metoprolol was increased from 25mg  BID to 50mg  BID.  Today he presents to clinic for follow up. He has been walking twice a day for at least 15 minutes with no chest pain or shortness of breath. He does have chronic shortness of breath due to COPD but has not been worse. He has totally quit smoking. He wants to do cardiac rehabilitation but he thinks it might be too expensive. No large 70 edema, orthopnea or PND no dizziness or syncope. No blood in his stool or urine.   Past Medical History  Diagnosis Date  . Hypertension   . Hypercholesterolemia   . COPD (chronic obstructive pulmonary disease) ([place])     not on home o2    Past Surgical History  Procedure Laterality Date  . Cataract extraction w/phaco  02/02/2011    Procedure: CATARACT EXTRACTION PHACO AND INTRAOCULAR LENS PLACEMENT (IOC);  Surgeon: Tonny Branch;  Location: [facility] ORS;  Service: Ophthalmology;  Laterality: Left;  CDE  12.33  . Colonoscopy  02/13/2004    [id number] polyp at 30 cm resected/normal rectum. 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   . Colonoscopy N/A 11/23/2012    Procedure: COLONOSCOPY;  Surgeon: Daneil Dolin, MD;  Location: [facility] ENDO SUITE;  Service: Endoscopy;  Laterality: N/A;  12:15-rescheduled to 11:00am Darius Bump notified pt  . Cardiac catheterization N/A 10/02/2015    Procedure: Left Heart Cath and Coronary Angiography;  Surgeon: Sherren Mocha, MD;  Location: [place] CV LAB;  Service: Cardiovascular;  Laterality: N/A;  . Cardiac catheterization N/A 10/02/2015    Procedure: Intravascular Ultrasound/IVUS;  Surgeon: Sherren Mocha, MD;  Location: [place] CV LAB;  Service: Cardiovascular;  Laterality: N/A;  . Cardiac catheterization N/A 10/03/2015    Procedure: Coronary Stent Intervention Rotablator;  Surgeon: Sherren Mocha, MD;  Location: [place] CV LAB;  Service: Cardiovascular;  Laterality: N/A;    Current Medications: Outpatient Prescriptions Prior to Visit  Medication Sig Dispense Refill  . aspirin EC 81 MG EC tablet Take 1 tablet (81 mg total) by mouth daily.    . clopidogrel (PLAVIX) 75 MG tablet Take 1 tablet (75 mg total) by mouth daily. 30 tablet 11  . losartan (COZAAR) 50 MG tablet Take 1 tablet (50 mg total) by mouth daily. 30 tablet 11  . metoprolol  tartrate (LOPRESSOR) 50 MG tablet Take 1 tablet (50 mg total) by mouth 2 (two) times daily. 60 tablet 11  . nitroGLYCERIN (NITROSTAT) 0.4 MG SL tablet Place 1 tablet (0.4 mg total) under the tongue every 5 (five) minutes x 3 doses as needed for chest pain. 25 tablet 3  . pantoprazole (PROTONIX) 40 MG tablet Take 40 mg by mouth daily.  12  . diltiazem (CARDIZEM CD) 180 MG 24 hr capsule Take 180 mg by mouth daily.      . pravastatin (PRAVACHOL) 80 MG tablet Take 1 tablet (80 mg total) by mouth daily. 30 tablet 11   No  facility-administered medications prior to visit.     Allergies:   Review of patient's allergies indicates no known allergies.   Social History   Social History  . Marital Status: Married    Spouse Name: N/A  . Number of Children: N/A  . Years of Education: N/A   Social History Main Topics  . Smoking status: Former Smoker -- 0.25 packs/day for 40 years    Types: Cigarettes  . Smokeless tobacco: None     Comment: smokes 1-2 cigarettes per day   . Alcohol Use: 0.6 oz/week    1 Standard drinks or equivalent per week     Comment: glass of wine about 3 days a week   . Drug Use: No  . Sexual Activity: Yes   Other Topics Concern  . None   Social History Narrative     Family History:  The patient's family history includes Heart attack in his brother and mother; Heart attack (age of onset: 72) in his brother; Stroke in his father. There is no history of Anesthesia problems, Hypotension, Malignant hyperthermia, Pseudochol deficiency, or Colon cancer.     ROS:   Please see the history of present illness.    ROS All other systems reviewed and are negative.   PHYSICAL EXAM:   VS:  BP 102/60 mmHg  Pulse 60  Ht 6\' 2"  (1.88 m)  Wt 159 lb (72.122 kg)  BMI 20.41 kg/m2  SpO2 95%   GEN: Well nourished, well developed, in no acute distress HEENT: normal Neck: no JVD, carotid bruits, or masses Cardiac: RRR; no murmurs, rubs, or gallops,no edema  Respiratory:  clear to auscultation bilaterally, normal work of breathing GI: soft, nontender, nondistended, + BS MS: no deformity or atrophy Skin: warm and dry, no rash Neuro:  Alert and Oriented x 3, Strength and sensation are intact Psych: euthymic mood, full affect  Wt Readings from Last 3 Encounters:  10/11/15 159 lb (72.122 kg)  10/04/15 158 lb 15.2 oz (72.1 kg)  12/28/14 164 lb (74.39 kg)      Studies/Labs Reviewed:   EKG:  EKG is ordered today.  The ekg ordered today demonstrates sinus brady HR 59. TWI in inferior leads  improved from previous. Peaked T waves in V2-V4 similar to previous  Recent Labs: 10/01/2015: TSH 4.076 10/02/2015: ALT 18 10/04/2015: BUN 12; Creatinine, Ser 0.78; Hemoglobin 12.9*; Platelets 145*; Potassium 4.0; Sodium 139   Lipid Panel    Component Value Date/Time   CHOL 131 10/03/2015 0430   TRIG 71 10/03/2015 0430   HDL 42 10/03/2015 0430   CHOLHDL 3.1 10/03/2015 0430   VLDL 14 10/03/2015 0430   LDLCALC 75 10/03/2015 0430    Additional studies/ records that were reviewed today include:  LHC: 10/03/2015 Conclusion     1st RPLB lesion, 60% stenosed.  Prox LAD lesion, 50% stenosed.  Prox  Cx lesion, 100% stenosed.  Ost RCA to Prox RCA lesion, 90% stenosed. Post intervention, there is a 0% residual stenosis.  Successful PCI of the proximal RCA using rotational atherectomy and stenting (drug-eluting platform).  Recommend: DAPT with ASA and plavix at least 12 months. Aggressive medical therapy for risk reduction. Anticipate DC tomorrow if no complications arise.    Echo: 10/03/2015 Study Conclusions - Left ventricle: The cavity size was normal. Wall thickness was  normal. Systolic function was normal. The estimated ejection  fraction was in the range of 55% to 60%. Doppler parameters are  consistent with abnormal left ventricular relaxation (grade 1  diastolic dysfunction). - Aortic valve: There was mild regurgitation. - Mitral valve: There was mild regurgitation. - Atrial septum: No defect or patent foramen ovale was identified. _____________      ASSESSMENT & PLAN:    CAD: NSTEMI: s/p DES/arthrectomy of ost-prox RCA (10/03/15). Continue aspirin and plavix, Lopressor 50mg  BID and statin  HTN: BP well controlled on current regimen. BP is soft, will stop Cardizem 180mg  daily.   HLD: most recent LDL 75. Will change pravastatin 80 to atorvastatin 80mg  daily.   SVT: no recurrence. Continue BB  Long-standing tobacco use: he has quit smoking. No cravings and  doing well.   Medication Adjustments/Labs and Tests Ordered: Current medicines are reviewed at length with the patient today.  Concerns regarding medicines are outlined above.  Medication changes, Labs and Tests ordered today are listed in the Patient Instructions below. Patient Instructions  Medication Instructions:  Your physician has recommended you make the following change in your medication:  1.  STOP the Pravastatin 2.  STOP the Diltiazem 3.  START the Atorvastatin 80 mg taking 1 tablet daily   Labwork: None ordered  Testing/Procedures: None ordered  Follow-Up: Your physician recommends that you schedule a follow-up appointment in: [place]   Any Other Special Instructions Will Be Listed Below (If Applicable).   If you need a refill on your cardiac medications before your next appointment, please call your pharmacy.       Signed, Angelena Form, PA-C  10/11/2015 1:13 PM    [facility] [place], [place], [place]  [address] Phone: [phone]; Fax: [phone]

## 2015-10-11 ENCOUNTER — Ambulatory Visit (INDEPENDENT_AMBULATORY_CARE_PROVIDER_SITE_OTHER): Payer: Medicare HMO | Admitting: Physician Assistant

## 2015-10-11 ENCOUNTER — Ambulatory Visit (INDEPENDENT_AMBULATORY_CARE_PROVIDER_SITE_OTHER): Payer: Medicare HMO | Admitting: Pharmacist

## 2015-10-11 ENCOUNTER — Encounter: Payer: Self-pay | Admitting: Physician Assistant

## 2015-10-11 VITALS — BP 102/60 | HR 60 | Ht 74.0 in | Wt 159.0 lb

## 2015-10-11 DIAGNOSIS — I10 Essential (primary) hypertension: Secondary | ICD-10-CM

## 2015-10-11 DIAGNOSIS — I214 Non-ST elevation (NSTEMI) myocardial infarction: Secondary | ICD-10-CM | POA: Diagnosis not present

## 2015-10-11 MED ORDER — ATORVASTATIN CALCIUM 80 MG PO TABS: 80.0000 mg | ORAL_TABLET | Freq: Every day | ORAL | Status: DC

## 2015-10-11 NOTE — Patient Instructions (Addendum)
Medication Instructions:  Your physician has recommended you make the following change in your medication:  1.  STOP the Pravastatin 2.  STOP the Diltiazem 3.  START the Atorvastatin 80 mg taking 1 tablet daily   Labwork: None ordered  Testing/Procedures: None ordered  Follow-Up: Your physician recommends that you schedule a follow-up appointment in: [place]   Any Other Special Instructions Will Be Listed Below (If Applicable).   If you need a refill on your cardiac medications before your next appointment, please call your pharmacy.

## 2015-10-11 NOTE — Progress Notes (Signed)
Patient ID: AARAV GUIDA                 DOB: [birth year]                      MRN: [id number]    Pharmacy Transitions of Care Visit  HPI: Thomas Mosley is a 70 y.o. male admitted to [facility] from 6/20-6/23 with primary diagnosis of NSTEMI.  Pt originally went to [facility] on 6/20 and was transferred to [facility] with NSTEMI.  PMH is significant for tobacco abuse, hypertension, and HLD. He had a cardiac cath on 6/22. 1st RPLB with 60% stenosis, Prox LAD with 50% stenosis, Proc Cx with 100% stenosis but collaterals present, and have a DES placed with atherectomy to 90% stenosed ost RCA-Prox RCA with 0% residual stenosis. Recommendations for DAPT with ASA and Plavix for at least 1 year. Post cath he was placed on BB and ARB for better blood pressure control.  He had some brief episodes of SVT so his metoprolol was increased to 50mg  BID. Patient presents to clinic for pharmacy transitions of care medication reconciliation after hospital discharge.  Relevant labs:  LDL 75 SCr- 0.78 K- 4.0  All medications have been reviewed with patient.  Issues/concerns noted are as follows: 1. Lipids- on pravastatin prior to admission and LDL 75 2. Smoking cessation - Pt has quit smoking since he was discharged from the hospital.   Past Medical History  Diagnosis Date  . Hypertension   . Hypercholesterolemia   . COPD (chronic obstructive pulmonary disease) ([place])     not on home o2    Current Outpatient Prescriptions on File Prior to Visit  Medication Sig Dispense Refill  . aspirin EC 81 MG EC tablet Take 1 tablet (81 mg total) by mouth daily.    . clopidogrel (PLAVIX) 75 MG tablet Take 1 tablet (75 mg total) by mouth daily. 30 tablet 11  . diltiazem (CARDIZEM CD) 180 MG 24 hr capsule Take 180 mg by mouth daily.      Marland Kitchen losartan (COZAAR) 50 MG tablet Take 1 tablet (50 mg total) by mouth daily. 30 tablet 11  . metoprolol tartrate (LOPRESSOR) 50 MG tablet Take 1 tablet (50 mg total) by mouth  2 (two) times daily. 60 tablet 11  . nitroGLYCERIN (NITROSTAT) 0.4 MG SL tablet Place 1 tablet (0.4 mg total) under the tongue every 5 (five) minutes x 3 doses as needed for chest pain. 25 tablet 3  . pantoprazole (PROTONIX) 40 MG tablet Take 40 mg by mouth daily.  12  . pravastatin (PRAVACHOL) 80 MG tablet Take 1 tablet (80 mg total) by mouth daily. 30 tablet 11   No current facility-administered medications on file prior to visit.    No Known Allergies  Assessment/Plan:  1. Hyperlipidemia- Pt's LDL elevated on pravastatin.  Would suggest higher potency statin such as Lipitor 40 or 80mg  daily given his recent event.   2.  Hypertension- Pt's BP on lower side today.  Metoprolol and losartan are new medications for him.  No clear indication for needing diltiazem on top of those.  Would consider d/c this and monitoring BP.

## 2015-10-14 NOTE — Telephone Encounter (Signed)
Pt had office visit with Nell Range, PA 10/11/15.

## 2015-11-05 ENCOUNTER — Encounter (HOSPITAL_COMMUNITY)
Admission: RE | Admit: 2015-11-05 | Discharge: 2015-11-05 | Disposition: A | Discharge status: Home / Self Care | Payer: Medicare HMO | Source: Ambulatory Visit | Attending: Cardiovascular Disease | Admitting: Cardiovascular Disease

## 2015-11-05 VITALS — BP 132/70 | HR 59 | Ht 74.0 in | Wt 160.7 lb

## 2015-11-05 DIAGNOSIS — Z955 Presence of coronary angioplasty implant and graft: Secondary | ICD-10-CM | POA: Insufficient documentation

## 2015-11-05 DIAGNOSIS — I214 Non-ST elevation (NSTEMI) myocardial infarction: Secondary | ICD-10-CM | POA: Diagnosis present

## 2015-11-05 NOTE — Progress Notes (Signed)
Cardiac Individual Treatment Plan  Patient Details  Name: Thomas Mosley MRN: [id number] Date of Birth: [birth year] Referring Provider:   Flowsheet Row CARDIAC REHAB PHASE II ORIENTATION from 11/05/2015 in [place]  Referring Provider  Dr. Burt Knack      Initial Encounter Date:  Flowsheet Row CARDIAC REHAB PHASE II ORIENTATION from 11/05/2015 in [place]  Date  11/05/15  Referring Provider  Dr. Burt Knack      Visit Diagnosis: NSTEMI (non-ST elevated myocardial infarction) [facility])  Stented coronary artery  Patient's Home Medications on Admission:  Current Outpatient Prescriptions:  .  aspirin EC 81 MG EC tablet, Take 1 tablet (81 mg total) by mouth daily., Disp: , Rfl:  .  atorvastatin (LIPITOR) 80 MG tablet, Take 1 tablet (80 mg total) by mouth daily., Disp: 90 tablet, Rfl: 3 .  clopidogrel (PLAVIX) 75 MG tablet, Take 1 tablet (75 mg total) by mouth daily., Disp: 30 tablet, Rfl: 11 .  losartan (COZAAR) 50 MG tablet, Take 1 tablet (50 mg total) by mouth daily., Disp: 30 tablet, Rfl: 11 .  metoprolol tartrate (LOPRESSOR) 50 MG tablet, Take 1 tablet (50 mg total) by mouth 2 (two) times daily., Disp: 60 tablet, Rfl: 11 .  nitroGLYCERIN (NITROSTAT) 0.4 MG SL tablet, Place 1 tablet (0.4 mg total) under the tongue every 5 (five) minutes x 3 doses as needed for chest pain., Disp: 25 tablet, Rfl: 3 .  pantoprazole (PROTONIX) 40 MG tablet, Take 40 mg by mouth daily., Disp: , Rfl: 12  Past Medical History: Past Medical History:  Diagnosis Date  . COPD (chronic obstructive pulmonary disease) (HCC)    not on home o2  . Hypercholesterolemia   . Hypertension     Tobacco Use: History  Smoking Status  . Former Smoker  . Packs/day: 0.25  . Years: 40.00  . Types: Cigarettes  Smokeless Tobacco  . Not on file    Comment: smokes 1-2 cigarettes per day     Labs: Recent Review Flowsheet Data    Labs for ITP Cardiac and Pulmonary Rehab Latest Ref  Rng & Units 10/01/2015 10/02/2015 10/03/2015   Cholestrol 0 - 200 mg/dL 169 122 131   LDLCALC 0 - 99 mg/dL 101(H) 71 75   HDL >40 mg/dL 53 43 42   Trlycerides <150 mg/dL 76 42 71   Hemoglobin A1c 4.8 - 5.6 % 4.9 - -      Capillary Blood Glucose: No results found for: GLUCAP   Exercise Target Goals: Date: 11/05/15  Exercise Program Goal: Individual exercise prescription set with THRR, safety & activity barriers. Participant demonstrates ability to understand and report RPE using BORG scale, to self-measure pulse accurately, and to acknowledge the importance of the exercise prescription.  Exercise Prescription Goal: Starting with aerobic activity 30 plus minutes a day, 3 days per week for initial exercise prescription. Provide home exercise prescription and guidelines that participant acknowledges understanding prior to discharge.  Activity Barriers & Risk Stratification:     Activity Barriers & Cardiac Risk Stratification - 11/05/15 1706      Activity Barriers & Cardiac Risk Stratification   Activity Barriers None   Cardiac Risk Stratification High      6 Minute Walk:     6 Minute Walk    Row Name 11/05/15 1710         6 Minute Walk   Phase Initial     Distance 1300 feet     Walk Time 6 minutes     #  of Rest Breaks 0     MPH 2.46     RPE 13     Perceived Dyspnea  13     VO2 Peak 11.55     Symptoms No     Resting HR 59 bpm     Resting BP 132/70     Max Ex. HR 82 bpm     Max Ex. BP 150/82     2 Minute Post BP 144/80        Initial Exercise Prescription:     Initial Exercise Prescription - 11/05/15 1700      Date of Initial Exercise RX and Referring Provider   Date 11/05/15   Referring Provider Dr. Burt Knack     NuStep   Level 2   Watts 23   Minutes 15   METs 2.1     Recumbant Elliptical   Level 2   RPM 40   Watts 41   Minutes 20   METs 2.5     Prescription Details   Frequency (times per week) 3   Duration Progress to 30 minutes of continuous  aerobic without signs/symptoms of physical distress     Intensity   THRR REST +  30   THRR 40-80% of Max Heartrate [id number]   Ratings of Perceived Exertion 11-13   Perceived Dyspnea 0-4     Progression   Progression Continue to progress workloads to maintain intensity without signs/symptoms of physical distress.     Resistance Training   Training Prescription Yes   Weight 2   Reps 8-10      Perform Capillary Blood Glucose checks as needed.  Exercise Prescription Changes:   Exercise Comments:      Exercise Comments    Row Name 11/05/15 1720           Exercise Comments This is patients first visit/orientation. We will continue to progress and monitor patient appropriately.           Discharge Exercise Prescription (Final Exercise Prescription Changes):   Nutrition:  Target Goals: Understanding of nutrition guidelines, daily intake of sodium 1500mg , cholesterol 200mg , calories 30% from fat and 7% or less from saturated fats, daily to have 5 or more servings of fruits and vegetables.  Biometrics:     Pre Biometrics - 11/05/15 1722      Pre Biometrics   Height 6\' 2"  (1.88 m)   Weight 160 lb 11.2 oz (72.9 kg)   Waist Circumference 35.5 inches   Hip Circumference 36.5 inches   Waist to Hip Ratio 0.97 %   BMI (Calculated) 20.7   Triceps Skinfold 9 mm   % Body Fat 20.6 %   Grip Strength 87 kg   Flexibility 11.3 in   Single Leg Stand 60 seconds       Nutrition Therapy Plan and Nutrition Goals:   Nutrition Discharge: Rate Your Plate Scores:     Nutrition Assessments - 11/05/15 1725      MEDFICTS Scores   Pre Score 48      Nutrition Goals Re-Evaluation:   Psychosocial: Target Goals: Acknowledge presence or absence of depression, maximize coping skills, provide positive support system. Participant is able to verbalize types and ability to use techniques and skills needed for reducing stress and depression.  Initial Review & Psychosocial  Screening:     Initial Psych Review & Screening - 11/05/15 1730      Initial Review   Current issues with --  None     Family Dynamics  Good Support System? Yes     Barriers   Psychosocial barriers to participate in program There are no identifiable barriers or psychosocial needs.     Screening Interventions   Interventions Encouraged to exercise      Quality of Life Scores:     Quality of Life - 11/05/15 1725      Quality of Life Scores   Health/Function Pre 22.7 %   Socioeconomic Pre 30 %   Psych/Spiritual Pre 30 %   Family Pre 30 %   GLOBAL Pre 26.68 %      PHQ-9: Recent Review Flowsheet Data    Depression screen [facility] 2/9 11/05/2015   Decreased Interest 0   Down, Depressed, Hopeless 0   PHQ - 2 Score 0   Altered sleeping 0   Tired, decreased energy 0   Change in appetite 0   Feeling bad or failure about yourself  0   Trouble concentrating 0   Moving slowly or fidgety/restless 0   Suicidal thoughts 0   PHQ-9 Score 0   Difficult doing work/chores Not difficult at all      Psychosocial Evaluation and Intervention:     Psychosocial Evaluation - 11/05/15 1730      Psychosocial Evaluation & Interventions   Interventions Encouraged to exercise with the program and follow exercise prescription   Comments Not depressed so no counseling needed   Continued Psychosocial Services Needed No      Psychosocial Re-Evaluation:   Vocational Rehabilitation: Provide vocational rehab assistance to qualifying candidates.   Vocational Rehab Evaluation & Intervention:     Vocational Rehab - 11/05/15 1708      Initial Vocational Rehab Evaluation & Intervention   Assessment shows need for Vocational Rehabilitation No      Education: Education Goals: Education classes will be provided on a weekly basis, covering required topics. Participant will state understanding/return demonstration of topics presented.  Learning Barriers/Preferences:     Learning  Barriers/Preferences - 11/05/15 1707      Learning Barriers/Preferences   Learning Barriers None   Learning Preferences Pictoral;Written Material      Education Topics: Hypertension, Hypertension Reduction -Define heart disease and high blood pressure. Discus how high blood pressure affects the body and ways to reduce high blood pressure.   Exercise and Your Heart -Discuss why it is important to exercise, the FITT principles of exercise, normal and abnormal responses to exercise, and how to exercise safely.   Angina -Discuss definition of angina, causes of angina, treatment of angina, and how to decrease risk of having angina.   Cardiac Medications -Review what the following cardiac medications are used for, how they affect the body, and side effects that may occur when taking the medications.  Medications include Aspirin, Beta blockers, calcium channel blockers, ACE Inhibitors, angiotensin receptor blockers, diuretics, digoxin, and antihyperlipidemics.   Congestive Heart Failure -Discuss the definition of CHF, how to live with CHF, the signs and symptoms of CHF, and how keep track of weight and sodium intake.   Heart Disease and Intimacy -Discus the effect sexual activity has on the heart, how changes occur during intimacy as we age, and safety during sexual activity.   Smoking Cessation / COPD -Discuss different methods to quit smoking, the health benefits of quitting smoking, and the definition of COPD.   Nutrition I: Fats -Discuss the types of cholesterol, what cholesterol does to the heart, and how cholesterol levels can be controlled.   Nutrition II: Labels -Discuss the different components of  food labels and how to read food label   Heart Parts and Heart Disease -Discuss the anatomy of the heart, the pathway of blood circulation through the heart, and these are affected by heart disease.   Stress I: Signs and Symptoms -Discuss the causes of stress, how stress  may lead to anxiety and depression, and ways to limit stress.   Stress II: Relaxation -Discuss different types of relaxation techniques to limit stress.   Warning Signs of Stroke / TIA -Discuss definition of a stroke, what the signs and symptoms are of a stroke, and how to identify when someone is having stroke.   Knowledge Questionnaire Score:     Knowledge Questionnaire Score - 11/05/15 1707      Knowledge Questionnaire Score   Pre Score 23/24      Core Components/Risk Factors/Patient Goals at Admission:     Personal Goals and Risk Factors at Admission - 11/05/15 1726      Core Components/Risk Factors/Patient Goals on Admission    Weight Management Weight Gain   Increase Strength and Stamina Yes   Intervention Provide advice, education, support and counseling about physical activity/exercise needs.   Expected Outcomes Achievement of increased cardiorespiratory fitness and enhanced flexibility, muscular endurance and strength shown through measurements of functional capacity and personal statement of participant.   Personal Goal Other Yes   Personal Goal Gain 15-20lbs, Gain more strength   Intervention Talk to dietician about how to increase calories safely to gain weight, attend CR 2xweek and supplement 3 x week at home.    Expected Outcomes To meet personal goals.       Core Components/Risk Factors/Patient Goals Review:    Core Components/Risk Factors/Patient Goals at Discharge (Final Review):    ITP Comments:   Comments: Patient arrived for 1st visit/orientation/education at 1430. Patient was referred to CR by Dr. Burt Knack due to NSTEMI (I21.4) and stent placement (Z95.5). During orientation advised patient on arrival and appointment times what to wear, what to do before, during and after exercise. Reviewed attendance and class policy. Talked about inclement weather and class consultation policy. Pt is scheduled to return Cardiac Rehab on 11/11/15 at 0930. Pt was  advised to come to class 15 minutes before class starts. He was also given instructions on meeting with the dietician and attending the Family Structure classes. Pt is eager to get started. Patient was able to complete 6 minute walk test. Patient was measured for the equipment. Discussed equipment safety with patient. Took patient pre-anthropometric measurements. Patient finished visit at 1630.

## 2015-11-05 NOTE — Progress Notes (Signed)
Cardiac/Pulmonary Rehab Medication Review by a Pharmacist  Does the patient  feel that his/her medications are working for him/her?  yes  Has the patient been experiencing any side effects to the medications prescribed?  no  Does the patient measure his/her own blood pressure or blood glucose at home?  no   Does the patient have any problems obtaining medications due to transportation or finances?   no  Understanding of regimen: excellent Understanding of indications: excellent Potential of compliance: excellent  Questions asked to Determine Patient Understanding of Medication Regimen:  1. What is the name of the medication?  2. What is the medication used for?  3. When should it be taken?  4. How much should be taken?  5. How will you take it?  6. What side effects should you report?  Understanding Defined as: Excellent: All questions above are correct Good: Questions 1-4 are correct Fair: Questions 1-2 are correct  Poor: 1 or none of the above questions are correct   Pharmacist comments: Pt states he is not having any side effects from medications.  No problems noted.  Recommended patient get a BP monitor for home and keep a journal of BP to take to MD.    Hart Robinsons A 11/05/2015 3:04 PM

## 2015-11-11 ENCOUNTER — Encounter (HOSPITAL_COMMUNITY)
Admission: RE | Admit: 2015-11-11 | Discharge: 2015-11-11 | Disposition: A | Discharge status: Home / Self Care | Payer: Medicare HMO | Source: Ambulatory Visit | Attending: Cardiovascular Disease | Admitting: Cardiovascular Disease

## 2015-11-11 DIAGNOSIS — Z955 Presence of coronary angioplasty implant and graft: Secondary | ICD-10-CM | POA: Diagnosis not present

## 2015-11-11 DIAGNOSIS — I214 Non-ST elevation (NSTEMI) myocardial infarction: Secondary | ICD-10-CM

## 2015-11-11 NOTE — Progress Notes (Signed)
Daily Session Note  Patient Details  Name: TIMOTEO CARREIRO MRN: [id number] Date of Birth: 15-Jan-1946 Referring Provider:   Flowsheet Row CARDIAC REHAB PHASE II ORIENTATION from 11/05/2015 in [place]  Referring Provider  Dr. Burt Knack      Encounter Date: 11/11/2015  Check In:     Session Check In - 11/11/15 0930      Check-In   Location [facility]   Staff Present Diane Angelina Pih, MS, EP, [facility], Exercise Physiologist;Debra Wynetta Emery, RN, BSN;Nicholad Kautzman, BS, EP, Exercise Physiologist   Supervising physician immediately available to respond to emergencies See telemetry face sheet for immediately available MD   Medication changes reported     No   Fall or balance concerns reported    No   Warm-up and Cool-down Performed as group-led instruction   Resistance Training Performed Yes   VAD Patient? No     Pain Assessment   Currently in Pain? No/denies   Pain Score 0-No pain   Multiple Pain Sites No      Capillary Blood Glucose: No results found for this or any previous visit (from the past 24 hour(s)).   Goals Met:  Independence with exercise equipment Exercise tolerated well No report of cardiac concerns or symptoms Strength training completed today  Goals Unmet:  Not Applicable  Comments: Check out 1030   Dr. Kate Sable is Medical Director for [place].

## 2015-11-13 ENCOUNTER — Encounter (HOSPITAL_COMMUNITY)
Admission: RE | Admit: 2015-11-13 | Discharge: 2015-11-13 | Disposition: A | Discharge status: Home / Self Care | Payer: Medicare HMO | Source: Ambulatory Visit | Attending: Cardiovascular Disease | Admitting: Cardiovascular Disease

## 2015-11-13 DIAGNOSIS — Z955 Presence of coronary angioplasty implant and graft: Secondary | ICD-10-CM | POA: Insufficient documentation

## 2015-11-13 DIAGNOSIS — I214 Non-ST elevation (NSTEMI) myocardial infarction: Secondary | ICD-10-CM | POA: Insufficient documentation

## 2015-11-13 NOTE — Progress Notes (Signed)
Daily Session Note  Patient Details  Name: Thomas Mosley MRN: [id number] Date of Birth: [birth year] Referring Provider:   Flowsheet Row CARDIAC REHAB PHASE II ORIENTATION from 11/05/2015 in [place]  Referring Provider  Dr. Burt Knack      Encounter Date: 11/13/2015  Check In:     Session Check In - 11/13/15 0930      Check-In   Location [facility] Pul[facility]   Staff Present Diane Angelina Pih, MS, EP, [facility], Exercise Physiologist;Gregory Luther Parody, BS, EP, Exercise Physiologist;Darshan Solanki Wynetta Emery, RN, BSN   Supervising physician immediately available to respond to emergencies See telemetry face sheet for immediately available MD   Medication changes reported     No   Fall or balance concerns reported    No   Warm-up and Cool-down Performed as group-led instruction   Resistance Training Performed Yes   VAD Patient? No     Pain Assessment   Currently in Pain? No/denies   Pain Score 0-No pain   Multiple Pain Sites No      Capillary Blood Glucose: No results found for this or any previous visit (from the past 24 hour(s)).   Goals Met:  Independence with exercise equipment Exercise tolerated well No report of cardiac concerns or symptoms Strength training completed today  Goals Unmet:  Not Applicable  Comments: Check out 1030.   Dr. Kate Sable is Medical Director for [facility] Cardiac and Pulmonary Rehab.

## 2015-11-15 ENCOUNTER — Encounter (HOSPITAL_COMMUNITY): Payer: Medicare HMO

## 2015-11-18 ENCOUNTER — Encounter (HOSPITAL_COMMUNITY)
Admission: RE | Admit: 2015-11-18 | Discharge: 2015-11-18 | Disposition: A | Discharge status: Home / Self Care | Payer: Medicare HMO | Source: Ambulatory Visit | Attending: Cardiovascular Disease | Admitting: Cardiovascular Disease

## 2015-11-18 DIAGNOSIS — Z955 Presence of coronary angioplasty implant and graft: Secondary | ICD-10-CM

## 2015-11-18 DIAGNOSIS — I214 Non-ST elevation (NSTEMI) myocardial infarction: Secondary | ICD-10-CM

## 2015-11-18 NOTE — Progress Notes (Signed)
Daily Session Note  Patient Details  Name: [name] MRN: [id number] Date of Birth: 08-May-1945 Referring Provider:   Flowsheet Row CARDIAC REHAB PHASE II ORIENTATION from 11/05/2015 in [place]  Referring Provider  Dr. Burt Knack      Encounter Date: 11/18/2015  Check In:     Session Check In - 11/18/15 0930      Check-In   Location [facility]   Staff Present Diane Angelina Pih, MS, EP, [facility], Exercise Physiologist;Jaklyn Alen Luther Parody, BS, EP, Exercise Physiologist;Debra Wynetta Emery, RN, BSN   Supervising physician immediately available to respond to emergencies See telemetry face sheet for immediately available MD   Medication changes reported     No   Fall or balance concerns reported    No   Warm-up and Cool-down Performed as group-led instruction   Resistance Training Performed Yes   VAD Patient? No     Pain Assessment   Currently in Pain? No/denies   Pain Score 0-No pain   Multiple Pain Sites No      Capillary Blood Glucose: No results found for this or any previous visit (from the past 24 hour(s)).   Goals Met:  Independence with exercise equipment Exercise tolerated well No report of cardiac concerns or symptoms Strength training completed today  Goals Unmet:  Not Applicable  Comments: Check out 1030   Dr. Kate Sable is Medical Director for [place] and Pulmonary Rehab.

## 2015-11-20 ENCOUNTER — Encounter (HOSPITAL_COMMUNITY)
Admission: RE | Admit: 2015-11-20 | Discharge: 2015-11-20 | Disposition: A | Discharge status: Home / Self Care | Payer: Medicare HMO | Source: Ambulatory Visit | Attending: Cardiovascular Disease | Admitting: Cardiovascular Disease

## 2015-11-20 DIAGNOSIS — Z955 Presence of coronary angioplasty implant and graft: Secondary | ICD-10-CM

## 2015-11-20 DIAGNOSIS — I214 Non-ST elevation (NSTEMI) myocardial infarction: Secondary | ICD-10-CM

## 2015-11-20 NOTE — Progress Notes (Signed)
Daily Session Note  Patient Details  Name: Thomas Mosley MRN: [id number] Date of Birth: Jun 09, 1945 Referring Provider:   Flowsheet Row CARDIAC REHAB PHASE II ORIENTATION from 11/05/2015 in [place]  Referring Provider  Dr. Burt Knack      Encounter Date: 11/20/2015  Check In:     Session Check In - 11/20/15 0930      Check-In   Location [facility]   Staff Present Diane Angelina Pih, MS, EP, [facility], Exercise Physiologist;Debra Wynetta Emery, RN, BSN;Safiyya Stokes, BS, EP, Exercise Physiologist   Supervising physician immediately available to respond to emergencies See telemetry face sheet for immediately available MD   Medication changes reported     No   Fall or balance concerns reported    No   Warm-up and Cool-down Performed as group-led instruction   Resistance Training Performed Yes   VAD Patient? No     Pain Assessment   Currently in Pain? No/denies   Pain Score 0-No pain   Multiple Pain Sites No      Capillary Blood Glucose: No results found for this or any previous visit (from the past 24 hour(s)).      Exercise Prescription Changes - 11/19/15 1300      Exercise Review   Progression Yes     Response to Exercise   Blood Pressure (Admit) 100/62   Blood Pressure (Exercise) 158/72   Blood Pressure (Exit) 120/60   Heart Rate (Admit) 85 bpm   Heart Rate (Exercise) 102 bpm   Heart Rate (Exit) 94 bpm   Rating of Perceived Exertion (Exercise) 12   Duration Progress to 30 minutes of continuous aerobic without signs/symptoms of physical distress   Intensity Rest + 30     Progression   Progression Continue progressive overload as per policy without signs/symptoms or physical distress.     Resistance Training   Training Prescription Yes   Weight 3   Reps 10-12     NuStep   Level 3   Watts 21   Minutes 15   METs 3.45     Recumbant Elliptical   Level 3   RPM 44   Watts 53   Minutes 20   METs 2.8     Home Exercise Plan   Plans  to continue exercise at Home   Frequency Add 2 additional days to program exercise sessions.     Goals Met:  Independence with exercise equipment Exercise tolerated well No report of cardiac concerns or symptoms Strength training completed today  Goals Unmet:  Not Applicable  Comments: Check out 1030   Dr. Kate Sable is Medical Director for [place] and Pulmonary Rehab.

## 2015-11-22 ENCOUNTER — Encounter (HOSPITAL_COMMUNITY): Payer: Medicare HMO

## 2015-11-25 ENCOUNTER — Encounter (HOSPITAL_COMMUNITY)
Admission: RE | Admit: 2015-11-25 | Discharge: 2015-11-25 | Disposition: A | Discharge status: Home / Self Care | Payer: Medicare HMO | Source: Ambulatory Visit | Attending: Cardiovascular Disease | Admitting: Cardiovascular Disease

## 2015-11-25 DIAGNOSIS — I214 Non-ST elevation (NSTEMI) myocardial infarction: Secondary | ICD-10-CM

## 2015-11-25 DIAGNOSIS — Z955 Presence of coronary angioplasty implant and graft: Secondary | ICD-10-CM | POA: Diagnosis not present

## 2015-11-25 NOTE — Progress Notes (Signed)
Daily Session Note  Patient Details  Name: JACKSYN BEEKS MRN: [id number] Date of Birth: Jul 08, 1945 Referring Provider:   Flowsheet Row CARDIAC REHAB PHASE II ORIENTATION from 11/05/2015 in [place]  Referring Provider  Dr. Burt Knack      Encounter Date: 11/25/2015  Check In:     Session Check In - 11/25/15 0930      Check-In   Location [facility]   Staff Present Russella Dar, MS, EP, [facility], Exercise Physiologist;Francys Bolin Luther Parody, BS, EP, Exercise Physiologist   Supervising physician immediately available to respond to emergencies See telemetry face sheet for immediately available MD   Medication changes reported     No   Fall or balance concerns reported    No   Warm-up and Cool-down Performed as group-led instruction   Resistance Training Performed Yes   VAD Patient? No     Pain Assessment   Currently in Pain? No/denies   Pain Score 0-No pain   Multiple Pain Sites No      Capillary Blood Glucose: No results found for this or any previous visit (from the past 24 hour(s)).   Goals Met:  Independence with exercise equipment Exercise tolerated well No report of cardiac concerns or symptoms Strength training completed today  Goals Unmet:  Not Applicable  Comments: Check out 1015   Dr. Kate Sable is Medical Director for [place] and Pulmonary Rehab.

## 2015-11-27 ENCOUNTER — Encounter (HOSPITAL_COMMUNITY)
Admission: RE | Admit: 2015-11-27 | Discharge: 2015-11-27 | Disposition: A | Discharge status: Home / Self Care | Payer: Medicare HMO | Source: Ambulatory Visit | Attending: Cardiovascular Disease | Admitting: Cardiovascular Disease

## 2015-11-27 DIAGNOSIS — Z955 Presence of coronary angioplasty implant and graft: Secondary | ICD-10-CM

## 2015-11-27 DIAGNOSIS — I214 Non-ST elevation (NSTEMI) myocardial infarction: Secondary | ICD-10-CM

## 2015-11-27 NOTE — Progress Notes (Signed)
Daily Session Note  Patient Details  Name: Thomas Mosley MRN: [id number] Date of Birth: [birth year] Referring Provider:   Flowsheet Row CARDIAC REHAB PHASE II ORIENTATION from 11/05/2015 in [place]  Referring Provider  Dr. Burt Knack      Encounter Date: 11/27/2015  Check In:     Session Check In - 11/27/15 1027      Check-In   Location [facility]   Staff Present Sharice Harriss Angelina Pih, MS, EP, [facility], Exercise Physiologist;Debra Wynetta Emery, RN, BSN   Supervising physician immediately available to respond to emergencies See telemetry face sheet for immediately available MD   Medication changes reported     No   Fall or balance concerns reported    No   Warm-up and Cool-down Performed as group-led instruction   VAD Patient? No     Pain Assessment   Currently in Pain? No/denies   Pain Score 0-No pain   Multiple Pain Sites No      Capillary Blood Glucose: No results found for this or any previous visit (from the past 24 hour(s)).   Goals Met:  Independence with exercise equipment Exercise tolerated well No report of cardiac concerns or symptoms Strength training completed today  Goals Unmet:  Not Applicable  Comments: Check out: 1030   Dr. Kate Sable is Medical Director for [place] and Pulmonary Rehab.

## 2015-11-28 NOTE — Progress Notes (Signed)
Cardiac Individual Treatment Plan  Patient Details  Name: Thomas Mosley MRN: [id number] Date of Birth: 1945/12/28 Referring Provider:   Flowsheet Row CARDIAC REHAB PHASE II ORIENTATION from 11/05/2015 in [place]  Referring Provider  Dr. Burt Knack      Initial Encounter Date:  Flowsheet Row CARDIAC REHAB PHASE II ORIENTATION from 11/05/2015 in [place]  Date  11/05/15  Referring Provider  Dr. Burt Knack      Visit Diagnosis: NSTEMI (non-ST elevated myocardial infarction) [facility])  Stented coronary artery  Patient's Home Medications on Admission:  Current Outpatient Prescriptions:  .  aspirin EC 81 MG EC tablet, Take 1 tablet (81 mg total) by mouth daily., Disp: , Rfl:  .  atorvastatin (LIPITOR) 80 MG tablet, Take 1 tablet (80 mg total) by mouth daily., Disp: 90 tablet, Rfl: 3 .  clopidogrel (PLAVIX) 75 MG tablet, Take 1 tablet (75 mg total) by mouth daily., Disp: 30 tablet, Rfl: 11 .  losartan (COZAAR) 50 MG tablet, Take 1 tablet (50 mg total) by mouth daily., Disp: 30 tablet, Rfl: 11 .  metoprolol tartrate (LOPRESSOR) 50 MG tablet, Take 1 tablet (50 mg total) by mouth 2 (two) times daily., Disp: 60 tablet, Rfl: 11 .  nitroGLYCERIN (NITROSTAT) 0.4 MG SL tablet, Place 1 tablet (0.4 mg total) under the tongue every 5 (five) minutes x 3 doses as needed for chest pain., Disp: 25 tablet, Rfl: 3 .  pantoprazole (PROTONIX) 40 MG tablet, Take 40 mg by mouth daily., Disp: , Rfl: 12  Past Medical History: Past Medical History:  Diagnosis Date  . COPD (chronic obstructive pulmonary disease) (HCC)    not on home o2  . Hypercholesterolemia   . Hypertension     Tobacco Use: History  Smoking Status  . Former Smoker  . Packs/day: 0.25  . Years: 40.00  . Types: Cigarettes  Smokeless Tobacco  . Not on file    Comment: smokes 1-2 cigarettes per day     Labs: Recent Review Flowsheet Data    Labs for ITP Cardiac and Pulmonary Rehab Latest Ref  Rng & Units 10/01/2015 10/02/2015 10/03/2015   Cholestrol 0 - 200 mg/dL 169 122 131   LDLCALC 0 - 99 mg/dL 101(H) 71 75   HDL >40 mg/dL 53 43 42   Trlycerides <150 mg/dL 76 42 71   Hemoglobin A1c 4.8 - 5.6 % 4.9 - -      Capillary Blood Glucose: No results found for: GLUCAP   Exercise Target Goals:    Exercise Program Goal: Individual exercise prescription set with THRR, safety & activity barriers. Participant demonstrates ability to understand and report RPE using BORG scale, to self-measure pulse accurately, and to acknowledge the importance of the exercise prescription.  Exercise Prescription Goal: Starting with aerobic activity 30 plus minutes a day, 3 days per week for initial exercise prescription. Provide home exercise prescription and guidelines that participant acknowledges understanding prior to discharge.  Activity Barriers & Risk Stratification:     Activity Barriers & Cardiac Risk Stratification - 11/05/15 1706      Activity Barriers & Cardiac Risk Stratification   Activity Barriers None   Cardiac Risk Stratification High      6 Minute Walk:     6 Minute Walk    Row Name 11/05/15 1710         6 Minute Walk   Phase Initial     Distance 1300 feet     Walk Time 6 minutes     #  of Rest Breaks 0     MPH 2.46     RPE 13     Perceived Dyspnea  13     VO2 Peak 11.55     Symptoms No     Resting HR 59 bpm     Resting BP 132/70     Max Ex. HR 82 bpm     Max Ex. BP 150/82     2 Minute Post BP 144/80        Initial Exercise Prescription:     Initial Exercise Prescription - 11/05/15 1700      Date of Initial Exercise RX and Referring Provider   Date 11/05/15   Referring Provider Dr. Burt Knack     NuStep   Level 2   Watts 23   Minutes 15   METs 2.1     Recumbant Elliptical   Level 2   RPM 40   Watts 41   Minutes 20   METs 2.5     Prescription Details   Frequency (times per week) 3   Duration Progress to 30 minutes of continuous aerobic  without signs/symptoms of physical distress     Intensity   THRR REST +  30   THRR 40-80% of Max Heartrate [phone]   Ratings of Perceived Exertion 11-13   Perceived Dyspnea 0-4     Progression   Progression Continue to progress workloads to maintain intensity without signs/symptoms of physical distress.     Resistance Training   Training Prescription Yes   Weight 2   Reps 8-10      Perform Capillary Blood Glucose checks as needed.  Exercise Prescription Changes:      Exercise Prescription Changes    Row Name 11/19/15 1300             Exercise Review   Progression Yes         Response to Exercise   Blood Pressure (Admit) 100/62       Blood Pressure (Exercise) 158/72       Blood Pressure (Exit) 120/60       Heart Rate (Admit) 85 bpm       Heart Rate (Exercise) 102 bpm       Heart Rate (Exit) 94 bpm       Rating of Perceived Exertion (Exercise) 12       Duration Progress to 30 minutes of continuous aerobic without signs/symptoms of physical distress       Intensity Rest + 30         Progression   Progression Continue progressive overload as per policy without signs/symptoms or physical distress.         Resistance Training   Training Prescription Yes       Weight 3       Reps 10-12         NuStep   Level 3       Watts 21       Minutes 15       METs 3.45         Recumbant Elliptical   Level 3       RPM 44       Watts 53       Minutes 20       METs 2.8         Home Exercise Plan   Plans to continue exercise at Home       Frequency Add 2 additional days to program exercise sessions.  Exercise Comments:      Exercise Comments    Row Name 11/05/15 1720 11/19/15 1311         Exercise Comments This is patients first visit/orientation. We will continue to progress and monitor patient appropriately. Patient is progressing appropriately          Discharge Exercise Prescription (Final Exercise Prescription Changes):     Exercise  Prescription Changes - 11/19/15 1300      Exercise Review   Progression Yes     Response to Exercise   Blood Pressure (Admit) 100/62   Blood Pressure (Exercise) 158/72   Blood Pressure (Exit) 120/60   Heart Rate (Admit) 85 bpm   Heart Rate (Exercise) 102 bpm   Heart Rate (Exit) 94 bpm   Rating of Perceived Exertion (Exercise) 12   Duration Progress to 30 minutes of continuous aerobic without signs/symptoms of physical distress   Intensity Rest + 30     Progression   Progression Continue progressive overload as per policy without signs/symptoms or physical distress.     Resistance Training   Training Prescription Yes   Weight 3   Reps 10-12     NuStep   Level 3   Watts 21   Minutes 15   METs 3.45     Recumbant Elliptical   Level 3   RPM 44   Watts 53   Minutes 20   METs 2.8     Home Exercise Plan   Plans to continue exercise at Home   Frequency Add 2 additional days to program exercise sessions.      Nutrition:  Target Goals: Understanding of nutrition guidelines, daily intake of sodium 1500mg , cholesterol 200mg , calories 30% from fat and 7% or less from saturated fats, daily to have 5 or more servings of fruits and vegetables.  Biometrics:     Pre Biometrics - 11/05/15 1722      Pre Biometrics   Height 6\' 2"  (1.88 m)   Weight 160 lb 11.2 oz (72.9 kg)   Waist Circumference 35.5 inches   Hip Circumference 36.5 inches   Waist to Hip Ratio 0.97 %   BMI (Calculated) 20.7   Triceps Skinfold 9 mm   % Body Fat 20.6 %   Grip Strength 87 kg   Flexibility 11.3 in   Single Leg Stand 60 seconds       Nutrition Therapy Plan and Nutrition Goals:   Nutrition Discharge: Rate Your Plate Scores:     Nutrition Assessments - 11/05/15 1725      MEDFICTS Scores   Pre Score 48      Nutrition Goals Re-Evaluation:   Psychosocial: Target Goals: Acknowledge presence or absence of depression, maximize coping skills, provide positive support system. Participant  is able to verbalize types and ability to use techniques and skills needed for reducing stress and depression.  Initial Review & Psychosocial Screening:     Initial Psych Review & Screening - 11/05/15 1730      Initial Review   Current issues with --  None     Family Dynamics   Good Support System? Yes     Barriers   Psychosocial barriers to participate in program There are no identifiable barriers or psychosocial needs.     Screening Interventions   Interventions Encouraged to exercise      Quality of Life Scores:     Quality of Life - 11/05/15 1725      Quality of Life Scores   Health/Function Pre 22.7 %  Socioeconomic Pre 30 %   Psych/Spiritual Pre 30 %   Family Pre 30 %   GLOBAL Pre 26.68 %      PHQ-9: Recent Review Flowsheet Data    Depression screen [facility] 2/9 11/05/2015   Decreased Interest 0   Down, Depressed, Hopeless 0   PHQ - 2 Score 0   Altered sleeping 0   Tired, decreased energy 0   Change in appetite 0   Feeling bad or failure about yourself  0   Trouble concentrating 0   Moving slowly or fidgety/restless 0   Suicidal thoughts 0   PHQ-9 Score 0   Difficult doing work/chores Not difficult at all      Psychosocial Evaluation and Intervention:     Psychosocial Evaluation - 11/05/15 1730      Psychosocial Evaluation & Interventions   Interventions Encouraged to exercise with the program and follow exercise prescription   Comments Not depressed so no counseling needed   Continued Psychosocial Services Needed No      Psychosocial Re-Evaluation:     Psychosocial Re-Evaluation    [place] Name 11/28/15 [phone]             Psychosocial Re-Evaluation   Interventions Encouraged to attend Cardiac Rehabilitation for the exercise       Comments Patient overal QOL socre is 26.68. He is not depressed and does not need counseling.        Continued Psychosocial Services Needed No          Vocational Rehabilitation: Provide vocational rehab  assistance to qualifying candidates.   Vocational Rehab Evaluation & Intervention:     Vocational Rehab - 11/05/15 1708      Initial Vocational Rehab Evaluation & Intervention   Assessment shows need for Vocational Rehabilitation No      Education: Education Goals: Education classes will be provided on a weekly basis, covering required topics. Participant will state understanding/return demonstration of topics presented.  Learning Barriers/Preferences:     Learning Barriers/Preferences - 11/05/15 1707      Learning Barriers/Preferences   Learning Barriers None   Learning Preferences Pictoral;Written Material      Education Topics: Hypertension, Hypertension Reduction -Define heart disease and high blood pressure. Discus how high blood pressure affects the body and ways to reduce high blood pressure. Flowsheet Row CARDIAC REHAB PHASE II EXERCISE from 11/20/2015 in [place]  Date  11/13/15  Educator  DC  Instruction Review Code  2- meets goals/outcomes      Exercise and Your Heart -Discuss why it is important to exercise, the FITT principles of exercise, normal and abnormal responses to exercise, and how to exercise safely. Flowsheet Row CARDIAC REHAB PHASE II EXERCISE from 11/20/2015 in [place]  Date  11/20/15  Educator  DC  Instruction Review Code  2- meets goals/outcomes      Angina -Discuss definition of angina, causes of angina, treatment of angina, and how to decrease risk of having angina.   Cardiac Medications -Review what the following cardiac medications are used for, how they affect the body, and side effects that may occur when taking the medications.  Medications include Aspirin, Beta blockers, calcium channel blockers, ACE Inhibitors, angiotensin receptor blockers, diuretics, digoxin, and antihyperlipidemics.   Congestive Heart Failure -Discuss the definition of CHF, how to live with CHF, the signs and  symptoms of CHF, and how keep track of weight and sodium intake.   Heart Disease and Intimacy -Discus the effect sexual activity  has on the heart, how changes occur during intimacy as we age, and safety during sexual activity.   Smoking Cessation / COPD -Discuss different methods to quit smoking, the health benefits of quitting smoking, and the definition of COPD.   Nutrition I: Fats -Discuss the types of cholesterol, what cholesterol does to the heart, and how cholesterol levels can be controlled.   Nutrition II: Labels -Discuss the different components of food labels and how to read food label   Heart Parts and Heart Disease -Discuss the anatomy of the heart, the pathway of blood circulation through the heart, and these are affected by heart disease.   Stress I: Signs and Symptoms -Discuss the causes of stress, how stress may lead to anxiety and depression, and ways to limit stress.   Stress II: Relaxation -Discuss different types of relaxation techniques to limit stress.   Warning Signs of Stroke / TIA -Discuss definition of a stroke, what the signs and symptoms are of a stroke, and how to identify when someone is having stroke.   Knowledge Questionnaire Score:     Knowledge Questionnaire Score - 11/05/15 1707      Knowledge Questionnaire Score   Pre Score 23/24      Core Components/Risk Factors/Patient Goals at Admission:     Personal Goals and Risk Factors at Admission - 11/05/15 1726      Core Components/Risk Factors/Patient Goals on Admission    Weight Management Weight Gain   Increase Strength and Stamina Yes   Intervention Provide advice, education, support and counseling about physical activity/exercise needs.   Expected Outcomes Achievement of increased cardiorespiratory fitness and enhanced flexibility, muscular endurance and strength shown through measurements of functional capacity and personal statement of participant.   Personal Goal Other Yes    Personal Goal Gain 15-20lbs, Gain more strength   Intervention Talk to dietician about how to increase calories safely to gain weight, attend CR 2xweek and supplement 3 x week at home.    Expected Outcomes To meet personal goals.       Core Components/Risk Factors/Patient Goals Review:      Goals and Risk Factor Review    Row Name 11/28/15 0839             Core Components/Risk Factors/Patient Goals Review   Personal Goals Review Increase Strength and Stamina  Patient is looking to gain 15-20lbs. Encouraged him to make an appointment with our dietician to help him to safely increase his calorie intake.        Review Patient is gaining strength and stamina. He is losing weight not gaining.        Expected Outcomes To reach expected personal goals.           Core Components/Risk Factors/Patient Goals at Discharge (Final Review):      Goals and Risk Factor Review - 11/28/15 0839      Core Components/Risk Factors/Patient Goals Review   Personal Goals Review Increase Strength and Stamina  Patient is looking to gain 15-20lbs. Encouraged him to make an appointment with our dietician to help him to safely increase his calorie intake.    Review Patient is gaining strength and stamina. He is losing weight not gaining.    Expected Outcomes To reach expected personal goals.       ITP Comments:   Comments: 30 day review. Patient is doing well with program. Continue to monitor.

## 2015-11-28 NOTE — Progress Notes (Signed)
Cardiac Individual Treatment Plan  Patient Details  Name: Thomas Mosley MRN: [id number] Date of Birth: 12/12/1945 Referring Provider:   Flowsheet Row CARDIAC REHAB PHASE II ORIENTATION from 11/05/2015 in [place]  Referring Provider  Dr. Burt Knack      Initial Encounter Date:  Flowsheet Row CARDIAC REHAB PHASE II ORIENTATION from 11/05/2015 in [place]  Date  11/05/15  Referring Provider  Dr. Burt Knack      Visit Diagnosis: NSTEMI (non-ST elevated myocardial infarction) [facility])  Stented coronary artery  Patient's Home Medications on Admission:  Current Outpatient Prescriptions:  .  aspirin EC 81 MG EC tablet, Take 1 tablet (81 mg total) by mouth daily., Disp: , Rfl:  .  atorvastatin (LIPITOR) 80 MG tablet, Take 1 tablet (80 mg total) by mouth daily., Disp: 90 tablet, Rfl: 3 .  clopidogrel (PLAVIX) 75 MG tablet, Take 1 tablet (75 mg total) by mouth daily., Disp: 30 tablet, Rfl: 11 .  losartan (COZAAR) 50 MG tablet, Take 1 tablet (50 mg total) by mouth daily., Disp: 30 tablet, Rfl: 11 .  metoprolol tartrate (LOPRESSOR) 50 MG tablet, Take 1 tablet (50 mg total) by mouth 2 (two) times daily., Disp: 60 tablet, Rfl: 11 .  nitroGLYCERIN (NITROSTAT) 0.4 MG SL tablet, Place 1 tablet (0.4 mg total) under the tongue every 5 (five) minutes x 3 doses as needed for chest pain., Disp: 25 tablet, Rfl: 3 .  pantoprazole (PROTONIX) 40 MG tablet, Take 40 mg by mouth daily., Disp: , Rfl: 12  Past Medical History: Past Medical History:  Diagnosis Date  . COPD (chronic obstructive pulmonary disease) (HCC)    not on home o2  . Hypercholesterolemia   . Hypertension     Tobacco Use: History  Smoking Status  . Former Smoker  . Packs/day: 0.25  . Years: 40.00  . Types: Cigarettes  Smokeless Tobacco  . Not on file    Comment: smokes 1-2 cigarettes per day     Labs: Recent Review Flowsheet Data    Labs for ITP Cardiac and Pulmonary Rehab Latest Ref  Rng & Units 10/01/2015 10/02/2015 10/03/2015   Cholestrol 0 - 200 mg/dL 169 122 131   LDLCALC 0 - 99 mg/dL 101(H) 71 75   HDL >40 mg/dL 53 43 42   Trlycerides <150 mg/dL 76 42 71   Hemoglobin A1c 4.8 - 5.6 % 4.9 - -      Capillary Blood Glucose: No results found for: GLUCAP   Exercise Target Goals:    Exercise Program Goal: Individual exercise prescription set with THRR, safety & activity barriers. Participant demonstrates ability to understand and report RPE using BORG scale, to self-measure pulse accurately, and to acknowledge the importance of the exercise prescription.  Exercise Prescription Goal: Starting with aerobic activity 30 plus minutes a day, 3 days per week for initial exercise prescription. Provide home exercise prescription and guidelines that participant acknowledges understanding prior to discharge.  Activity Barriers & Risk Stratification:     Activity Barriers & Cardiac Risk Stratification - 11/05/15 1706      Activity Barriers & Cardiac Risk Stratification   Activity Barriers None   Cardiac Risk Stratification High      6 Minute Walk:     6 Minute Walk    Row Name 11/05/15 1710         6 Minute Walk   Phase Initial     Distance 1300 feet     Walk Time 6 minutes     #  of Rest Breaks 0     MPH 2.46     RPE 13     Perceived Dyspnea  13     VO2 Peak 11.55     Symptoms No     Resting HR 59 bpm     Resting BP 132/70     Max Ex. HR 82 bpm     Max Ex. BP 150/82     2 Minute Post BP 144/80        Initial Exercise Prescription:     Initial Exercise Prescription - 11/05/15 1700      Date of Initial Exercise RX and Referring Provider   Date 11/05/15   Referring Provider Dr. Burt Knack     NuStep   Level 2   Watts 23   Minutes 15   METs 2.1     Recumbant Elliptical   Level 2   RPM 40   Watts 41   Minutes 20   METs 2.5     Prescription Details   Frequency (times per week) 3   Duration Progress to 30 minutes of continuous aerobic  without signs/symptoms of physical distress     Intensity   THRR REST +  30   THRR 40-80% of Max Heartrate [phone]   Ratings of Perceived Exertion 11-13   Perceived Dyspnea 0-4     Progression   Progression Continue to progress workloads to maintain intensity without signs/symptoms of physical distress.     Resistance Training   Training Prescription Yes   Weight 2   Reps 8-10      Perform Capillary Blood Glucose checks as needed.  Exercise Prescription Changes:      Exercise Prescription Changes    Row Name 11/19/15 1300             Exercise Review   Progression Yes         Response to Exercise   Blood Pressure (Admit) 100/62       Blood Pressure (Exercise) 158/72       Blood Pressure (Exit) 120/60       Heart Rate (Admit) 85 bpm       Heart Rate (Exercise) 102 bpm       Heart Rate (Exit) 94 bpm       Rating of Perceived Exertion (Exercise) 12       Duration Progress to 30 minutes of continuous aerobic without signs/symptoms of physical distress       Intensity Rest + 30         Progression   Progression Continue progressive overload as per policy without signs/symptoms or physical distress.         Resistance Training   Training Prescription Yes       Weight 3       Reps 10-12         NuStep   Level 3       Watts 21       Minutes 15       METs 3.45         Recumbant Elliptical   Level 3       RPM 44       Watts 53       Minutes 20       METs 2.8         Home Exercise Plan   Plans to continue exercise at Home       Frequency Add 2 additional days to program exercise sessions.  Exercise Comments:      Exercise Comments    Row Name 11/05/15 1720 11/19/15 1311         Exercise Comments This is patients first visit/orientation. We will continue to progress and monitor patient appropriately. Patient is progressing appropriately          Discharge Exercise Prescription (Final Exercise Prescription Changes):     Exercise  Prescription Changes - 11/19/15 1300      Exercise Review   Progression Yes     Response to Exercise   Blood Pressure (Admit) 100/62   Blood Pressure (Exercise) 158/72   Blood Pressure (Exit) 120/60   Heart Rate (Admit) 85 bpm   Heart Rate (Exercise) 102 bpm   Heart Rate (Exit) 94 bpm   Rating of Perceived Exertion (Exercise) 12   Duration Progress to 30 minutes of continuous aerobic without signs/symptoms of physical distress   Intensity Rest + 30     Progression   Progression Continue progressive overload as per policy without signs/symptoms or physical distress.     Resistance Training   Training Prescription Yes   Weight 3   Reps 10-12     NuStep   Level 3   Watts 21   Minutes 15   METs 3.45     Recumbant Elliptical   Level 3   RPM 44   Watts 53   Minutes 20   METs 2.8     Home Exercise Plan   Plans to continue exercise at Home   Frequency Add 2 additional days to program exercise sessions.      Nutrition:  Target Goals: Understanding of nutrition guidelines, daily intake of sodium 1500mg , cholesterol 200mg , calories 30% from fat and 7% or less from saturated fats, daily to have 5 or more servings of fruits and vegetables.  Biometrics:     Pre Biometrics - 11/05/15 1722      Pre Biometrics   Height 6\' 2"  (1.88 m)   Weight 160 lb 11.2 oz (72.9 kg)   Waist Circumference 35.5 inches   Hip Circumference 36.5 inches   Waist to Hip Ratio 0.97 %   BMI (Calculated) 20.7   Triceps Skinfold 9 mm   % Body Fat 20.6 %   Grip Strength 87 kg   Flexibility 11.3 in   Single Leg Stand 60 seconds       Nutrition Therapy Plan and Nutrition Goals:   Nutrition Discharge: Rate Your Plate Scores:     Nutrition Assessments - 11/05/15 1725      MEDFICTS Scores   Pre Score 48      Nutrition Goals Re-Evaluation:   Psychosocial: Target Goals: Acknowledge presence or absence of depression, maximize coping skills, provide positive support system. Participant  is able to verbalize types and ability to use techniques and skills needed for reducing stress and depression.  Initial Review & Psychosocial Screening:     Initial Psych Review & Screening - 11/05/15 1730      Initial Review   Current issues with --  None     Family Dynamics   Good Support System? Yes     Barriers   Psychosocial barriers to participate in program There are no identifiable barriers or psychosocial needs.     Screening Interventions   Interventions Encouraged to exercise      Quality of Life Scores:     Quality of Life - 11/05/15 1725      Quality of Life Scores   Health/Function Pre 22.7 %  Socioeconomic Pre 30 %   Psych/Spiritual Pre 30 %   Family Pre 30 %   GLOBAL Pre 26.68 %      PHQ-9: Recent Review Flowsheet Data    Depression screen [facility] 2/9 11/05/2015   Decreased Interest 0   Down, Depressed, Hopeless 0   PHQ - 2 Score 0   Altered sleeping 0   Tired, decreased energy 0   Change in appetite 0   Feeling bad or failure about yourself  0   Trouble concentrating 0   Moving slowly or fidgety/restless 0   Suicidal thoughts 0   PHQ-9 Score 0   Difficult doing work/chores Not difficult at all      Psychosocial Evaluation and Intervention:     Psychosocial Evaluation - 11/05/15 1730      Psychosocial Evaluation & Interventions   Interventions Encouraged to exercise with the program and follow exercise prescription   Comments Not depressed so no counseling needed   Continued Psychosocial Services Needed No      Psychosocial Re-Evaluation:     Psychosocial Re-Evaluation    Row Name 11/28/15 0842 11/28/15 1401           Psychosocial Re-Evaluation   Interventions Encouraged to attend Cardiac Rehabilitation for the exercise Encouraged to attend Cardiac Rehabilitation for the exercise      Comments Patient overal QOL socre is 26.68. He is not depressed and does not need counseling.  Patient overal QOL socre is 26.68. He is not  depressed and does not need counseling.       Continued Psychosocial Services Needed No No         Vocational Rehabilitation: Provide vocational rehab assistance to qualifying candidates.   Vocational Rehab Evaluation & Intervention:     Vocational Rehab - 11/05/15 1708      Initial Vocational Rehab Evaluation & Intervention   Assessment shows need for Vocational Rehabilitation No      Education: Education Goals: Education classes will be provided on a weekly basis, covering required topics. Participant will state understanding/return demonstration of topics presented.  Learning Barriers/Preferences:     Learning Barriers/Preferences - 11/05/15 1707      Learning Barriers/Preferences   Learning Barriers None   Learning Preferences Pictoral;Written Material      Education Topics: Hypertension, Hypertension Reduction -Define heart disease and high blood pressure. Discus how high blood pressure affects the body and ways to reduce high blood pressure. Flowsheet Row CARDIAC REHAB PHASE II EXERCISE from 11/20/2015 in [place]  Date  11/13/15  Educator  DC  Instruction Review Code  2- meets goals/outcomes      Exercise and Your Heart -Discuss why it is important to exercise, the FITT principles of exercise, normal and abnormal responses to exercise, and how to exercise safely. Flowsheet Row CARDIAC REHAB PHASE II EXERCISE from 11/20/2015 in [place]  Date  11/20/15  Educator  DC  Instruction Review Code  2- meets goals/outcomes      Angina -Discuss definition of angina, causes of angina, treatment of angina, and how to decrease risk of having angina.   Cardiac Medications -Review what the following cardiac medications are used for, how they affect the body, and side effects that may occur when taking the medications.  Medications include Aspirin, Beta blockers, calcium channel blockers, ACE Inhibitors, angiotensin receptor  blockers, diuretics, digoxin, and antihyperlipidemics.   Congestive Heart Failure -Discuss the definition of CHF, how to live with CHF, the signs and symptoms  of CHF, and how keep track of weight and sodium intake.   Heart Disease and Intimacy -Discus the effect sexual activity has on the heart, how changes occur during intimacy as we age, and safety during sexual activity.   Smoking Cessation / COPD -Discuss different methods to quit smoking, the health benefits of quitting smoking, and the definition of COPD.   Nutrition I: Fats -Discuss the types of cholesterol, what cholesterol does to the heart, and how cholesterol levels can be controlled.   Nutrition II: Labels -Discuss the different components of food labels and how to read food label   Heart Parts and Heart Disease -Discuss the anatomy of the heart, the pathway of blood circulation through the heart, and these are affected by heart disease.   Stress I: Signs and Symptoms -Discuss the causes of stress, how stress may lead to anxiety and depression, and ways to limit stress.   Stress II: Relaxation -Discuss different types of relaxation techniques to limit stress.   Warning Signs of Stroke / TIA -Discuss definition of a stroke, what the signs and symptoms are of a stroke, and how to identify when someone is having stroke.   Knowledge Questionnaire Score:     Knowledge Questionnaire Score - 11/05/15 1707      Knowledge Questionnaire Score   Pre Score 23/24      Core Components/Risk Factors/Patient Goals at Admission:     Personal Goals and Risk Factors at Admission - 11/05/15 1726      Core Components/Risk Factors/Patient Goals on Admission    Weight Management Weight Gain   Increase Strength and Stamina Yes   Intervention Provide advice, education, support and counseling about physical activity/exercise needs.   Expected Outcomes Achievement of increased cardiorespiratory fitness and enhanced  flexibility, muscular endurance and strength shown through measurements of functional capacity and personal statement of participant.   Personal Goal Other Yes   Personal Goal Gain 15-20lbs, Gain more strength   Intervention Talk to dietician about how to increase calories safely to gain weight, attend CR 2xweek and supplement 3 x week at home.    Expected Outcomes To meet personal goals.       Core Components/Risk Factors/Patient Goals Review:      Goals and Risk Factor Review    Row Name 11/28/15 0839 11/28/15 1400           Core Components/Risk Factors/Patient Goals Review   Personal Goals Review Increase Strength and Stamina  Patient is looking to gain 15-20lbs. Encouraged him to make an appointment with our dietician to help him to safely increase his calorie intake.  Increase Strength and Stamina      Review Patient is gaining strength and stamina. He is losing weight not gaining.  Patient is gaining strength and stamina. He is losing weight not gaining.       Expected Outcomes To reach expected personal goals.  To reach expected personal goals.          Core Components/Risk Factors/Patient Goals at Discharge (Final Review):      Goals and Risk Factor Review - 11/28/15 1400      Core Components/Risk Factors/Patient Goals Review   Personal Goals Review Increase Strength and Stamina   Review Patient is gaining strength and stamina. He is losing weight not gaining.    Expected Outcomes To reach expected personal goals.       ITP Comments:   Comments: 30 day review. Patient is doing well in the program. Will continue  to monitor.

## 2015-11-29 ENCOUNTER — Encounter (HOSPITAL_COMMUNITY): Payer: Medicare HMO

## 2015-12-02 ENCOUNTER — Encounter (HOSPITAL_COMMUNITY)
Admission: RE | Admit: 2015-12-02 | Discharge: 2015-12-02 | Disposition: A | Discharge status: Home / Self Care | Payer: Medicare HMO | Source: Ambulatory Visit | Attending: Cardiovascular Disease | Admitting: Cardiovascular Disease

## 2015-12-02 VITALS — Ht 74.0 in | Wt 159.2 lb

## 2015-12-02 DIAGNOSIS — I214 Non-ST elevation (NSTEMI) myocardial infarction: Secondary | ICD-10-CM

## 2015-12-02 DIAGNOSIS — Z955 Presence of coronary angioplasty implant and graft: Secondary | ICD-10-CM | POA: Diagnosis not present

## 2015-12-02 NOTE — Progress Notes (Signed)
Daily Session Note  Patient Details  Name: Thomas Mosley MRN: [id number] Date of Birth: March 17, 1946 Referring Provider:   Flowsheet Row CARDIAC REHAB PHASE II ORIENTATION from 11/05/2015 in [place]  Referring Provider  Dr. Burt Knack      Encounter Date: 12/02/2015  Check In:     Session Check In - 12/02/15 0930      Check-In   Location [facility]   Staff Present Diane Angelina Pih, MS, EP, [facility], Exercise Physiologist;Debra Wynetta Emery, RN, BSN;Nicloe Frontera, BS, EP, Exercise Physiologist   Supervising physician immediately available to respond to emergencies See telemetry face sheet for immediately available MD   Medication changes reported     No   Fall or balance concerns reported    No   Warm-up and Cool-down Performed as group-led instruction   Resistance Training Performed Yes   VAD Patient? No     Pain Assessment   Currently in Pain? No/denies   Pain Score 0-No pain   Multiple Pain Sites No      Capillary Blood Glucose: No results found for this or any previous visit (from the past 24 hour(s)).   Goals Met:  Independence with exercise equipment Exercise tolerated well No report of cardiac concerns or symptoms Strength training completed today  Goals Unmet:  Not Applicable  Comments: Check out 1030   Dr. Kate Sable is Medical Director for [place] and Pulmonary Rehab.

## 2015-12-04 ENCOUNTER — Encounter (HOSPITAL_COMMUNITY)
Admission: RE | Admit: 2015-12-04 | Discharge: 2015-12-04 | Disposition: A | Discharge status: Home / Self Care | Payer: Medicare HMO | Source: Ambulatory Visit | Attending: Cardiovascular Disease | Admitting: Cardiovascular Disease

## 2015-12-04 DIAGNOSIS — Z955 Presence of coronary angioplasty implant and graft: Secondary | ICD-10-CM | POA: Diagnosis not present

## 2015-12-04 DIAGNOSIS — I214 Non-ST elevation (NSTEMI) myocardial infarction: Secondary | ICD-10-CM

## 2015-12-04 NOTE — Progress Notes (Signed)
Daily Session Note  Patient Details  Name: Thomas Mosley MRN: [id number] Date of Birth: January 09, 1946 Referring Provider:   Flowsheet Row CARDIAC REHAB PHASE II ORIENTATION from 11/05/2015 in [place]  Referring Provider  Dr. Burt Knack      Encounter Date: 12/04/2015  Check In:     Session Check In - 12/04/15 0930      Check-In   Location [facility]   Staff Present Diane Angelina Pih, MS, EP, [facility], Exercise Physiologist;Merilynn Haydu Luther Parody, BS, EP, Exercise Physiologist;Debra Wynetta Emery, RN, BSN   Supervising physician immediately available to respond to emergencies See telemetry face sheet for immediately available MD   Medication changes reported     No   Fall or balance concerns reported    No   Warm-up and Cool-down Performed as group-led instruction   Resistance Training Performed Yes   VAD Patient? No     Pain Assessment   Currently in Pain? No/denies   Pain Score 0-No pain   Multiple Pain Sites No      Capillary Blood Glucose: No results found for this or any previous visit (from the past 24 hour(s)).   Goals Met:  Independence with exercise equipment Exercise tolerated well No report of cardiac concerns or symptoms Strength training completed today  Goals Unmet:  Not Applicable  Comments: Check out 1030   Dr. Kate Sable is Medical Director for [place] and Pulmonary Rehab.

## 2015-12-05 ENCOUNTER — Encounter (HOSPITAL_COMMUNITY): Payer: Medicare HMO

## 2015-12-06 ENCOUNTER — Encounter (HOSPITAL_COMMUNITY): Payer: Medicare HMO

## 2015-12-09 ENCOUNTER — Encounter (HOSPITAL_COMMUNITY): Payer: Medicare HMO

## 2015-12-11 ENCOUNTER — Encounter (HOSPITAL_COMMUNITY): Payer: Medicare HMO

## 2015-12-12 NOTE — Progress Notes (Signed)
Discharge Summary  Patient Details  Name: Thomas Mosley MRN: [id number] Date of Birth: [birth year] Referring Provider:   Flowsheet Row CARDIAC REHAB PHASE II ORIENTATION from 11/05/2015 in [facility]  Referring Provider  Dr. Burt Knack       Number of Visits: 9  Reason for Discharge:  Early Exit:  Patient stopped after 9 sessions. He only wanted to do the program for 1 month when he started. He says he plans to exercise at home and join a gym.   Smoking History:  History  Smoking Status  . Former Smoker  . Packs/day: 0.25  . Years: 40.00  . Types: Cigarettes  Smokeless Tobacco  . Not on file    Comment: smokes 1-2 cigarettes per day     Diagnosis:  NSTEMI (non-ST elevated myocardial infarction) ([place])  ADL UCSD:   Initial Exercise Prescription:     Initial Exercise Prescription - 11/05/15 1700      Date of Initial Exercise RX and Referring Provider   Date 11/05/15   Referring Provider Dr. Burt Knack     NuStep   Level 2   Watts 23   Minutes 15   METs 2.1     Recumbant Elliptical   Level 2   RPM 40   Watts 41   Minutes 20   METs 2.5     Prescription Details   Frequency (times per week) 3   Duration Progress to 30 minutes of continuous aerobic without signs/symptoms of physical distress     Intensity   THRR REST +  30   THRR 40-80% of Max Heartrate [phone]   Ratings of Perceived Exertion 11-13   Perceived Dyspnea 0-4     Progression   Progression Continue to progress workloads to maintain intensity without signs/symptoms of physical distress.     Resistance Training   Training Prescription Yes   Weight 2   Reps 8-10      Discharge Exercise Prescription (Final Exercise Prescription Changes):     Exercise Prescription Changes - 11/19/15 1300      Exercise Review   Progression Yes     Response to Exercise   Blood Pressure (Admit) 100/62   Blood Pressure (Exercise) 158/72   Blood Pressure (Exit) 120/60   Heart Rate  (Admit) 85 bpm   Heart Rate (Exercise) 102 bpm   Heart Rate (Exit) 94 bpm   Rating of Perceived Exertion (Exercise) 12   Duration Progress to 30 minutes of continuous aerobic without signs/symptoms of physical distress   Intensity Rest + 30     Progression   Progression Continue progressive overload as per policy without signs/symptoms or physical distress.     Resistance Training   Training Prescription Yes   Weight 3   Reps 10-12     NuStep   Level 3   Watts 21   Minutes 15   METs 3.45     Recumbant Elliptical   Level 3   RPM 44   Watts 53   Minutes 20   METs 2.8     Home Exercise Plan   Plans to continue exercise at Home   Frequency Add 2 additional days to program exercise sessions.      Functional Capacity:     6 Minute Walk    Row Name 11/05/15 1710 12/04/15 1428       6 Minute Walk   Phase Initial Discharge    Distance 1300 feet 1500 feet    Distance % Change  -  15.38 %    Walk Time 6 minutes 6 minutes    # of Rest Breaks 0 0    MPH 2.46 2.46    METS  - 2.88    RPE 13 13    Perceived Dyspnea  13 13    VO2 Peak 11.55 13.69    Symptoms No No    Resting HR 59 bpm 71 bpm    Resting BP 132/70 154/70    Max Ex. HR 82 bpm 87 bpm    Max Ex. BP 150/82 168/84    2 Minute Post BP 144/80 150/78       Psychological, QOL, Others - Outcomes: PHQ 2/9: Depression screen PHQ 2/9 11/05/2015  Decreased Interest 0  Down, Depressed, Hopeless 0  PHQ - 2 Score 0  Altered sleeping 0  Tired, decreased energy 0  Change in appetite 0  Feeling bad or failure about yourself  0  Trouble concentrating 0  Moving slowly or fidgety/restless 0  Suicidal thoughts 0  PHQ-9 Score 0  Difficult doing work/chores Not difficult at all    Quality of Life:     Quality of Life - 12/04/15 1432      Quality of Life Scores   Health/Function Pre 22.7 %   Health/Function Post 24.43 %   Health/Function % Change 7.62 %   Socioeconomic Pre 30 %   Socioeconomic Post 25.71 %    Socioeconomic % Change  -14.3 %   Psych/Spiritual Pre 30 %   Psych/Spiritual Post 30 %   Psych/Spiritual % Change 0 %   Family Pre 30 %   Family Post 30 %   Family % Change 0 %   GLOBAL Pre 26.68 %   GLOBAL Post 26.73 %   GLOBAL % Change 0.19 %      Personal Goals: Goals established at orientation with interventions provided to work toward goal.     Personal Goals and Risk Factors at Admission - 11/05/15 1726      Core Components/Risk Factors/Patient Goals on Admission    Weight Management Weight Gain   Increase Strength and Stamina Yes   Intervention Provide advice, education, support and counseling about physical activity/exercise needs.   Expected Outcomes Achievement of increased cardiorespiratory fitness and enhanced flexibility, muscular endurance and strength shown through measurements of functional capacity and personal statement of participant.   Personal Goal Other Yes   Personal Goal Gain 15-20lbs, Gain more strength   Intervention Talk to dietician about how to increase calories safely to gain weight, attend CR 2xweek and supplement 3 x week at home.    Expected Outcomes To meet personal goals.        Personal Goals Discharge:     Goals and Risk Factor Review    Row Name 11/28/15 0839 11/28/15 1400           Core Components/Risk Factors/Patient Goals Review   Personal Goals Review Increase Strength and Stamina  Patient is looking to gain 15-20lbs. Encouraged him to make an appointment with our dietician to help him to safely increase his calorie intake.  Increase Strength and Stamina      Review Patient is gaining strength and stamina. He is losing weight not gaining.  Patient is gaining strength and stamina. He is losing weight not gaining.       Expected Outcomes To reach expected personal goals.  To reach expected personal goals.          Nutrition & Weight - Outcomes:  Pre Biometrics - 11/05/15 1722      Pre Biometrics   Height 6\' 2"  (1.88 m)    Weight 160 lb 11.2 oz (72.9 kg)   Waist Circumference 35.5 inches   Hip Circumference 36.5 inches   Waist to Hip Ratio 0.97 %   BMI (Calculated) 20.7   Triceps Skinfold 9 mm   % Body Fat 20.6 %   Grip Strength 87 kg   Flexibility 11.3 in   Single Leg Stand 60 seconds         Post Biometrics - 12/04/15 1430       Post  Biometrics   Height 6\' 2"  (1.88 m)   Weight 159 lb 2.8 oz (72.2 kg)   Waist Circumference 35.5 inches   Hip Circumference 36.5 inches   Waist to Hip Ratio 0.97 %   BMI (Calculated) 20.5   Triceps Skinfold 8 mm   % Body Fat 20 %   Grip Strength 107.7 kg   Flexibility 12.25 in   Single Leg Stand 60 seconds      Nutrition:   Nutrition Discharge:     Nutrition Assessments - 11/05/15 1725      MEDFICTS Scores   Pre Score 48      Education Questionnaire Score:     Knowledge Questionnaire Score - 11/05/15 1707      Knowledge Questionnaire Score   Pre Score 23/24      Goals reviewed with patient; copy given to patient.

## 2015-12-13 ENCOUNTER — Encounter (HOSPITAL_COMMUNITY): Payer: Medicare HMO

## 2015-12-16 ENCOUNTER — Encounter (HOSPITAL_COMMUNITY): Payer: Medicare HMO

## 2015-12-18 ENCOUNTER — Encounter (HOSPITAL_COMMUNITY): Payer: Medicare HMO

## 2015-12-20 ENCOUNTER — Encounter (HOSPITAL_COMMUNITY): Payer: Medicare HMO

## 2015-12-23 ENCOUNTER — Encounter (HOSPITAL_COMMUNITY): Payer: Medicare HMO

## 2015-12-25 ENCOUNTER — Encounter (HOSPITAL_COMMUNITY): Payer: Medicare HMO

## 2015-12-26 ENCOUNTER — Ambulatory Visit (HOSPITAL_COMMUNITY)
Admission: RE | Admit: 2015-12-26 | Discharge: 2015-12-26 | Disposition: A | Discharge status: Home / Self Care | Payer: Medicare HMO | Source: Ambulatory Visit | Attending: Pulmonary Disease | Admitting: Pulmonary Disease

## 2015-12-26 ENCOUNTER — Other Ambulatory Visit (HOSPITAL_COMMUNITY): Payer: Self-pay | Admitting: Pulmonary Disease

## 2015-12-26 DIAGNOSIS — R059 Cough, unspecified: Secondary | ICD-10-CM

## 2015-12-26 DIAGNOSIS — R918 Other nonspecific abnormal finding of lung field: Secondary | ICD-10-CM | POA: Diagnosis not present

## 2015-12-26 DIAGNOSIS — R05 Cough: Secondary | ICD-10-CM | POA: Insufficient documentation

## 2015-12-27 ENCOUNTER — Encounter (HOSPITAL_COMMUNITY): Payer: Self-pay | Admitting: *Deleted

## 2015-12-27 ENCOUNTER — Encounter (HOSPITAL_COMMUNITY): Payer: Medicare HMO

## 2015-12-30 ENCOUNTER — Encounter (HOSPITAL_COMMUNITY): Payer: Medicare HMO

## 2016-01-01 ENCOUNTER — Encounter (HOSPITAL_COMMUNITY): Payer: Medicare HMO

## 2016-01-03 ENCOUNTER — Encounter (HOSPITAL_COMMUNITY): Payer: Medicare HMO

## 2016-01-06 ENCOUNTER — Encounter (HOSPITAL_COMMUNITY): Payer: Medicare HMO

## 2016-01-08 ENCOUNTER — Encounter (HOSPITAL_COMMUNITY): Payer: Medicare HMO

## 2016-01-10 ENCOUNTER — Encounter (HOSPITAL_COMMUNITY): Payer: Medicare HMO

## 2016-01-13 ENCOUNTER — Encounter (HOSPITAL_COMMUNITY): Payer: Medicare HMO

## 2016-01-15 ENCOUNTER — Encounter (HOSPITAL_COMMUNITY): Payer: Medicare HMO

## 2016-01-17 ENCOUNTER — Encounter (HOSPITAL_COMMUNITY): Payer: Medicare HMO

## 2016-01-20 ENCOUNTER — Encounter (HOSPITAL_COMMUNITY): Payer: Medicare HMO

## 2016-01-22 ENCOUNTER — Encounter (HOSPITAL_COMMUNITY): Payer: Medicare HMO

## 2016-01-24 ENCOUNTER — Encounter (HOSPITAL_COMMUNITY): Payer: Medicare HMO

## 2016-01-27 ENCOUNTER — Encounter (HOSPITAL_COMMUNITY): Payer: Medicare HMO

## 2016-01-29 ENCOUNTER — Encounter (HOSPITAL_COMMUNITY): Payer: Medicare HMO

## 2016-01-31 ENCOUNTER — Encounter (HOSPITAL_COMMUNITY): Payer: Medicare HMO

## 2016-02-06 ENCOUNTER — Ambulatory Visit (INDEPENDENT_AMBULATORY_CARE_PROVIDER_SITE_OTHER): Payer: Medicare HMO | Admitting: Cardiovascular Disease

## 2016-02-06 ENCOUNTER — Encounter: Payer: Self-pay | Admitting: Cardiovascular Disease

## 2016-02-06 VITALS — BP 142/74 | HR 72 | Ht 74.0 in | Wt 158.8 lb

## 2016-02-06 DIAGNOSIS — I10 Essential (primary) hypertension: Secondary | ICD-10-CM

## 2016-02-06 DIAGNOSIS — I2510 Atherosclerotic heart disease of native coronary artery without angina pectoris: Secondary | ICD-10-CM

## 2016-02-06 NOTE — Patient Instructions (Signed)
Medication Instructions:  Your physician recommends that you continue on your current medications as directed. Please refer to the Current Medication list given to you today.  Labwork: No new orders.   Testing/Procedures: No new orders.   Follow-Up: Your physician wants you to follow-up in: June 2018 with Dr Burt Knack.  You will receive a reminder letter in the mail two months in advance. If you don't receive a letter, please call our office to schedule the follow-up appointment.   Any Other Special Instructions Will Be Listed Below (If Applicable).     If you need a refill on your cardiac medications before your next appointment, please call your pharmacy.

## 2016-02-06 NOTE — Progress Notes (Signed)
Cardiology Office Note Date:  02/06/2016   ID:  Thomas Mosley, DOB November 13, 1945, MRN [id number]  PCP:  Alonza Bogus, MD  Cardiologist:  Sherren Mocha, MD    Chief Complaint  Patient presents with  . Acute non Q wave MI    History of Present Illness: Thomas Mosley is a 70 y.o. male who presents for follow-up of CAD. He presented with non-ST elevation infarction in June 2017 and was found to have critical stenosis of the proximal right coronary artery with heavy calcification. He underwent rotational atherectomy and stenting without complications. He was found to have moderate disease of the LAD and chronic total occlusion of the circumflex with collaterals present. He's done well on his post hospital course was uncomplicated. He is here with his wife today. They are on their way to [place] to watch their granddaughter play softball.  The patient reports that he is doing well. He denies chest pain, shortness of breath, leg swelling, or heart palpitations.   Past Medical History:  Diagnosis Date  . COPD (chronic obstructive pulmonary disease) (HCC)    not on home o2  . Hypercholesterolemia   . Hypertension     Past Surgical History:  Procedure Laterality Date  . CARDIAC CATHETERIZATION N/A 10/02/2015   Procedure: Left Heart Cath and Coronary Angiography;  Surgeon: Sherren Mocha, MD;  Location: [place] [facility];  Service: Cardiovascular;  Laterality: N/A;  . CARDIAC CATHETERIZATION N/A 10/02/2015   Procedure: Intravascular Ultrasound/IVUS;  Surgeon: Sherren Mocha, MD;  Location: [place] [facility];  Service: Cardiovascular;  Laterality: N/A;  . CARDIAC CATHETERIZATION N/A 10/03/2015   Procedure: Coronary Stent Intervention Rotablator;  Surgeon: Sherren Mocha, MD;  Location: [place] [facility];  Service: Cardiovascular;  Laterality: N/A;  . CATARACT EXTRACTION W/PHACO  02/02/2011   Procedure: CATARACT EXTRACTION PHACO AND INTRAOCULAR LENS PLACEMENT (IOC);  Surgeon:  Tonny Branch;  Location: [facility];  Service: Ophthalmology;  Laterality: Left;  CDE 12.33  . COLONOSCOPY  02/13/2004   [id number] polyp at 30 cm resected/normal rectum. 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   . COLONOSCOPY N/A 11/23/2012   Procedure: COLONOSCOPY;  Surgeon: Daneil Dolin, MD;  [facility];  Service: Endoscopy;  Laterality: N/A;  12:15-rescheduled to 11:00am Darius Bump notified pt    Current Outpatient Prescriptions  Medication Sig Dispense Refill  . aspirin EC 81 MG EC tablet Take 1 tablet (81 mg total) by mouth daily.    Marland Kitchen atorvastatin (LIPITOR) 80 MG tablet Take 1 tablet (80 mg total) by mouth daily. 90 tablet 3  . clopidogrel (PLAVIX) 75 MG tablet Take 1 tablet (75 mg total) by mouth daily. 30 tablet 11  . losartan (COZAAR) 50 MG tablet Take 1 tablet (50 mg total) by mouth daily. 30 tablet 11  . metoprolol tartrate (LOPRESSOR) 50 MG tablet Take 1 tablet (50 mg total) by mouth 2 (two) times daily. 60 tablet 11  . nitroGLYCERIN (NITROSTAT) 0.4 MG SL tablet Place 1 tablet (0.4 mg total) under the tongue every 5 (five) minutes x 3 doses as needed for chest pain. 25 tablet 3  . pantoprazole (PROTONIX) 40 MG tablet Take 40 mg by mouth daily.  12   No current facility-administered medications for this visit.     Allergies:   Review of patient's allergies indicates no known allergies.   Social History:  The patient  reports that he has quit  smoking. His smoking use included Cigarettes. He has a 10.00 pack-year smoking history. He does not have any smokeless tobacco history on file. He reports that he drinks about 0.6 oz of alcohol per week . He reports that he does not use drugs.   Family History:  The patient's  family history includes Heart attack in his brother and mother; Heart attack (age of onset: 22) in his brother; Stroke in his father.    ROS:  Please see the history of  present illness.  All other systems are reviewed and negative.    PHYSICAL EXAM: VS:  BP (!) 142/74   Pulse 72   Ht 6\' 2"  (1.88 m)   Wt 158 lb 12.8 oz (72 kg)   BMI 20.39 kg/m  , BMI Body mass index is 20.39 kg/m. GEN: Well nourished, well developed, in no acute distress  HEENT: normal  Neck: no JVD, no masses. No carotid bruits Cardiac: RRR without murmur or gallop with distant heart sounds             Respiratory:  clear to auscultation bilaterally, normal work of breathing, prolonged expiration GI: soft, nontender, nondistended, + BS MS: no deformity or atrophy  Ext: no pretibial edema Skin: warm and dry, no rash Neuro:  Strength and sensation are intact Psych: euthymic mood, full affect  EKG:  EKG is not ordered today.  Recent Labs: 10/01/2015: TSH 4.076 10/02/2015: ALT 18 10/04/2015: BUN 12; Creatinine, Ser 0.78; Hemoglobin 12.9; Platelets 145; Potassium 4.0; Sodium 139   Lipid Panel     Component Value Date/Time   CHOL 131 10/03/2015 0430   TRIG 71 10/03/2015 0430   HDL 42 10/03/2015 0430   CHOLHDL 3.1 10/03/2015 0430   VLDL 14 10/03/2015 0430   LDLCALC 75 10/03/2015 0430      Wt Readings from Last 3 Encounters:  02/06/16 158 lb 12.8 oz (72 kg)  12/04/15 159 lb 2.8 oz (72.2 kg)  11/05/15 160 lb 11.2 oz (72.9 kg)     Cardiac Studies Reviewed: Cath 10-02-2015: Conclusion    There is moderate left ventricular systolic dysfunction.  Ost RCA to Prox RCA lesion, 90% stenosed.  1st RPLB lesion, 60% stenosed.  Prox LAD lesion, 50% stenosed.  Prox Cx lesion, 100% stenosed.   1. Severe complex stenosis of the proximal RCA with severe calcification extending back to the ostium of the vessel and angiographic appearance of possible thrombus 2. Moderate heavily calcified proximal LAD stenosis, unlikely to be hemodynamically significant by IVUS criteria (minimal lumen area 4.7 square mm) 3. Total occlusion of a small mid-circumflex, with the distal circumflex  filling late 4. Moderate segmental LV dysfunction with hypokinesis of the inferior wall and LVEF 45%  Recommendation: Will resume heparin and review revascularization options for the RCA. Consider CABG versus PCI.  Discussed with Dr Cyndia Bent who will review films. If PCI will require rotational atherectomy.   10-03-2015: Conclusion    1st RPLB lesion, 60% stenosed.  Prox LAD lesion, 50% stenosed.  Prox Cx lesion, 100% stenosed.  Ost RCA to Prox RCA lesion, 90% stenosed. Post intervention, there is a 0% residual stenosis.   Successful PCI of the proximal RCA using rotational atherectomy and stenting (drug-eluting platform).  Recommend: DAPT with ASA and plavix at least 12 months. Aggressive medical therapy for risk reduction. Anticipate DC tomorrow if no complications arise.    Echo: 10/03/2015 Study Conclusions - Left ventricle: The cavity size was normal. Wall thickness was  normal. Systolic function was normal.  The estimated ejection  fraction was in the range of 55% to 60%. Doppler parameters are  consistent with abnormal left ventricular relaxation (grade 1  diastolic dysfunction). - Aortic valve: There was mild regurgitation. - Mitral valve: There was mild regurgitation. - Atrial septum: No defect or patent foramen ovale was identified.  ASSESSMENT AND PLAN: 1.  Coronary artery disease, native vessel, without angina: The patient is stable on his current medical program which includes aspirin, clopidogrel, statin drug, and the beta blocker. I will see him back in June 2018 and will likely stop his clopidogrel at that time. He understands to call if any problems arise.  2. Essential hypertension: Blood pressure is controlled on his current medical therapy. He will continue on losartan and metoprolol.  3. Hyperlipidemia: The patient is treated with atorvastatin 80 mg. He's followed by his primary care physician.  4. Paroxysmal SVT: No symptoms at present and no documented  recurrence.  Current medicines are reviewed with the patient today.  The patient does not have concerns regarding medicines.  Labs/ tests ordered today include:  No orders of the defined types were placed in this encounter.  Disposition:   FU June 2018  Deatra James, MD  02/06/2016 4:17 PM    [facility] [place], [place], [place]  [address] Phone: [phone]; Fax: [phone]

## 2016-10-10 ENCOUNTER — Other Ambulatory Visit: Payer: Self-pay | Admitting: Physician Assistant

## 2016-10-11 NOTE — Progress Notes (Signed)
Cardiology Office Note Date:  10/12/2016   ID:  KAILIN LEU, DOB 1945-07-22, MRN [id number]  PCP:  Sinda Du, MD  Cardiologist:  Sherren Mocha, MD    Chief Complaint  Patient presents with  . Follow-up    CAD     History of Present Illness: Thomas Mosley is a 71 y.o. male who presents for follow-up of CAD. He presented in 2017 with a NSTEMI, found to have multivessel CAD With 50% LAD stenosis, chronic total occlusion of the left circumflex, and severe calcific stenosis of the proximal RCA treated with atherectomy and stenting. He was last seen in October 2017.  The patient is here with his wife today. He has been doing well. He hasn't been exercising as regularly as of frequent travel. He denies chest pain, chest pressure, and swelling, orthopnea, PND, or heart palpitations. He complains of easy bruising and wants to discontinue Plavix as soon as he can.  Past Medical History:  Diagnosis Date  . COPD (chronic obstructive pulmonary disease) (HCC)    not on home o2  . Hypercholesterolemia   . Hypertension     Past Surgical History:  Procedure Laterality Date  . CARDIAC CATHETERIZATION N/A 10/02/2015   Procedure: Left Heart Cath and Coronary Angiography;  Surgeon: Sherren Mocha, MD;  Location: [place] [facility];  Service: Cardiovascular;  Laterality: N/A;  . CARDIAC CATHETERIZATION N/A 10/02/2015   Procedure: Intravascular Ultrasound/IVUS;  Surgeon: Sherren Mocha, MD;  Location: [place] [facility];  Service: Cardiovascular;  Laterality: N/A;  . CARDIAC CATHETERIZATION N/A 10/03/2015   Procedure: Coronary Stent Intervention Rotablator;  Surgeon: Sherren Mocha, MD;  Location: [place] [facility];  Service: Cardiovascular;  Laterality: N/A;  . CATARACT EXTRACTION W/PHACO  02/02/2011   Procedure: CATARACT EXTRACTION PHACO AND INTRAOCULAR LENS PLACEMENT (IOC);  Surgeon: Tonny Branch;  [facility]: [facility];  Service: Ophthalmology;  Laterality: Left;  CDE 12.33  . COLONOSCOPY   02/13/2004   [id number] polyp at 30 cm resected/normal rectum. 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   . COLONOSCOPY N/A 11/23/2012   Procedure: COLONOSCOPY;  Surgeon: Daneil Dolin, MD;  [facility];  Service: Endoscopy;  Laterality: N/A;  12:15-rescheduled to 11:00am Darius Bump notified pt    Current Outpatient Prescriptions  Medication Sig Dispense Refill  . aspirin EC 81 MG EC tablet Take 1 tablet (81 mg total) by mouth daily.    Marland Kitchen atorvastatin (LIPITOR) 80 MG tablet TAKE 1 TABLET BY MOUTH EVERY DAY 90 tablet 0  . losartan (COZAAR) 50 MG tablet Take 1 tablet (50 mg total) by mouth daily. 30 tablet 11  . nitroGLYCERIN (NITROSTAT) 0.4 MG SL tablet Place 1 tablet (0.4 mg total) under the tongue every 5 (five) minutes x 3 doses as needed for chest pain. 25 tablet 3  . pantoprazole (PROTONIX) 40 MG tablet Take 40 mg by mouth daily.  12  . metoprolol succinate (TOPROL-XL) 100 MG 24 hr tablet Take 1 tablet (100 mg total) by mouth daily. Take with or immediately following a meal. 90 tablet 3   No current facility-administered medications for this visit.     Allergies:   Patient has no known allergies.   Social History:  The patient  reports that he has quit smoking. His smoking use included Cigarettes. He has a 10.00 pack-year smoking history. He has never used smokeless tobacco. He reports that he drinks about 0.6 oz of  alcohol per week . He reports that he does not use drugs.   Family History:  The patient's  family history includes Heart attack in his brother and mother; Heart attack (age of onset: 69) in his brother; Stroke in his father.    ROS:  Please see the history of present illness.  All other systems are reviewed and negative.    PHYSICAL EXAM: VS:  BP (!) 152/76   Pulse 76   Ht 6' 2.5" (1.892 m)   Wt 164 lb (74.4 kg)   BMI 20.77 kg/m  , BMI Body mass index is 20.77  kg/m. GEN: Well nourished, well developed, in no acute distress  HEENT: normal  Neck: no JVD, no masses. No carotid bruits Cardiac: RRR without murmur or gallop                Respiratory:  clear to auscultation bilaterally, normal work of breathing GI: soft, nontender, nondistended, + BS MS: no deformity or atrophy  Ext: no pretibial edema, pedal pulses 2+= bilaterally Skin: warm and dry, no rash Neuro:  Strength and sensation are intact Psych: euthymic mood, full affect  EKG:  EKG is ordered today. The ekg ordered today shows normal sinus rhythm 76 bpm, age-indeterminate septal infarct, cannot rule out age-indeterminate inferior infarct.  Recent Labs: No results found for requested labs within last 8760 hours.   Lipid Panel     Component Value Date/Time   CHOL 131 10/03/2015 0430   TRIG 71 10/03/2015 0430   HDL 42 10/03/2015 0430   CHOLHDL 3.1 10/03/2015 0430   VLDL 14 10/03/2015 0430   LDLCALC 75 10/03/2015 0430      Wt Readings from Last 3 Encounters:  10/12/16 164 lb (74.4 kg)  02/06/16 158 lb 12.8 oz (72 kg)  12/04/15 159 lb 2.8 oz (72.2 kg)     Cardiac Studies Reviewed: Echo:  10/03/2015 Study Conclusions - Left ventricle: The cavity size was normal. Wall thickness was  normal. Systolic function was normal. The estimated ejection  fraction was in the range of 55% to 60%. Doppler parameters are  consistent with abnormal left ventricular relaxation (grade 1  diastolic dysfunction). - Aortic valve: There was mild regurgitation. - Mitral valve: There was mild regurgitation. - Atrial septum: No defect or patent foramen ovale was identified.  ASSESSMENT AND PLAN: 1.  CAD, native vessel, without angina: The patient is doing well now one year out from PCI. He will continue aspirin 81 mg and can discontinue clopidogrel per guidelines. He will continue on his risk reduction program with atorvastatin, losartan, and metoprolol. I would like to see him back in one  year. I encouraged him to reinitiate efforts at an exercise program.  2. HTN: Suboptimal blood pressure control. He is forgetting his second dose of metoprolol. We'll change him to metoprolol succinate 100 mg daily. He will continue losartan 50 mg daily.  3. Hyperlipidemia: treated with atorvastatin 80 mg. Lipids followed by PCP.   Current medicines are reviewed with the patient today.  The patient does not have concerns regarding medicines.  Labs/ tests ordered today include:   Orders Placed This Encounter  Procedures  . EKG 12-Lead    Disposition:   FU one year  Signed, Sherren Mocha, MD  10/12/2016 1:45 PM    [facility] [place], [place], [place]  [address] Phone: [phone]; Fax: [phone]

## 2016-10-12 ENCOUNTER — Encounter: Payer: Self-pay | Admitting: Cardiovascular Disease

## 2016-10-12 ENCOUNTER — Ambulatory Visit (INDEPENDENT_AMBULATORY_CARE_PROVIDER_SITE_OTHER): Payer: Medicare HMO | Admitting: Cardiovascular Disease

## 2016-10-12 VITALS — BP 152/76 | HR 76 | Ht 74.5 in | Wt 164.0 lb

## 2016-10-12 DIAGNOSIS — I10 Essential (primary) hypertension: Secondary | ICD-10-CM

## 2016-10-12 DIAGNOSIS — I2510 Atherosclerotic heart disease of native coronary artery without angina pectoris: Secondary | ICD-10-CM

## 2016-10-12 MED ORDER — METOPROLOL SUCCINATE ER 100 MG PO TB24: 100.0000 mg | ORAL_TABLET | Freq: Every day | ORAL | 3 refills | Status: DC

## 2016-10-12 NOTE — Patient Instructions (Signed)
Medication Instructions:  Your physician has recommended you make the following change in your medication:  1. STOP Metoprolol Tartrate 2. START Metoprolol Succinate (Toprol XL) 100mg  take one tablet by mouth daily 3. STOP Plavix (clopidogrel)  Labwork: No new orders.   Testing/Procedures: No new orders.   Follow-Up: Your physician wants you to follow-up in: 1 YEAR with Dr Burt Knack. You will receive a reminder letter in the mail two months in advance. If you don't receive a letter, please call our office to schedule the follow-up appointment.   Any Other Special Instructions Will Be Listed Below (If Applicable).     If you need a refill on your cardiac medications before your next appointment, please call your pharmacy.

## 2016-10-26 ENCOUNTER — Other Ambulatory Visit: Payer: Self-pay | Admitting: Cardiovascular Disease

## 2017-01-07 ENCOUNTER — Other Ambulatory Visit: Payer: Self-pay | Admitting: Physician Assistant

## 2017-03-22 IMAGING — DX DG CHEST 2V
2 series · 2 of 2 positions shown · non-contrast
Comparison: 10/01/2015

CLINICAL DATA: Productive cough and sob x 1 week/htn/MI x couple of
months ago with stent placement/ex smoker/no fever or chest pain

EXAM:
CHEST  2 VIEW

[chest pa]
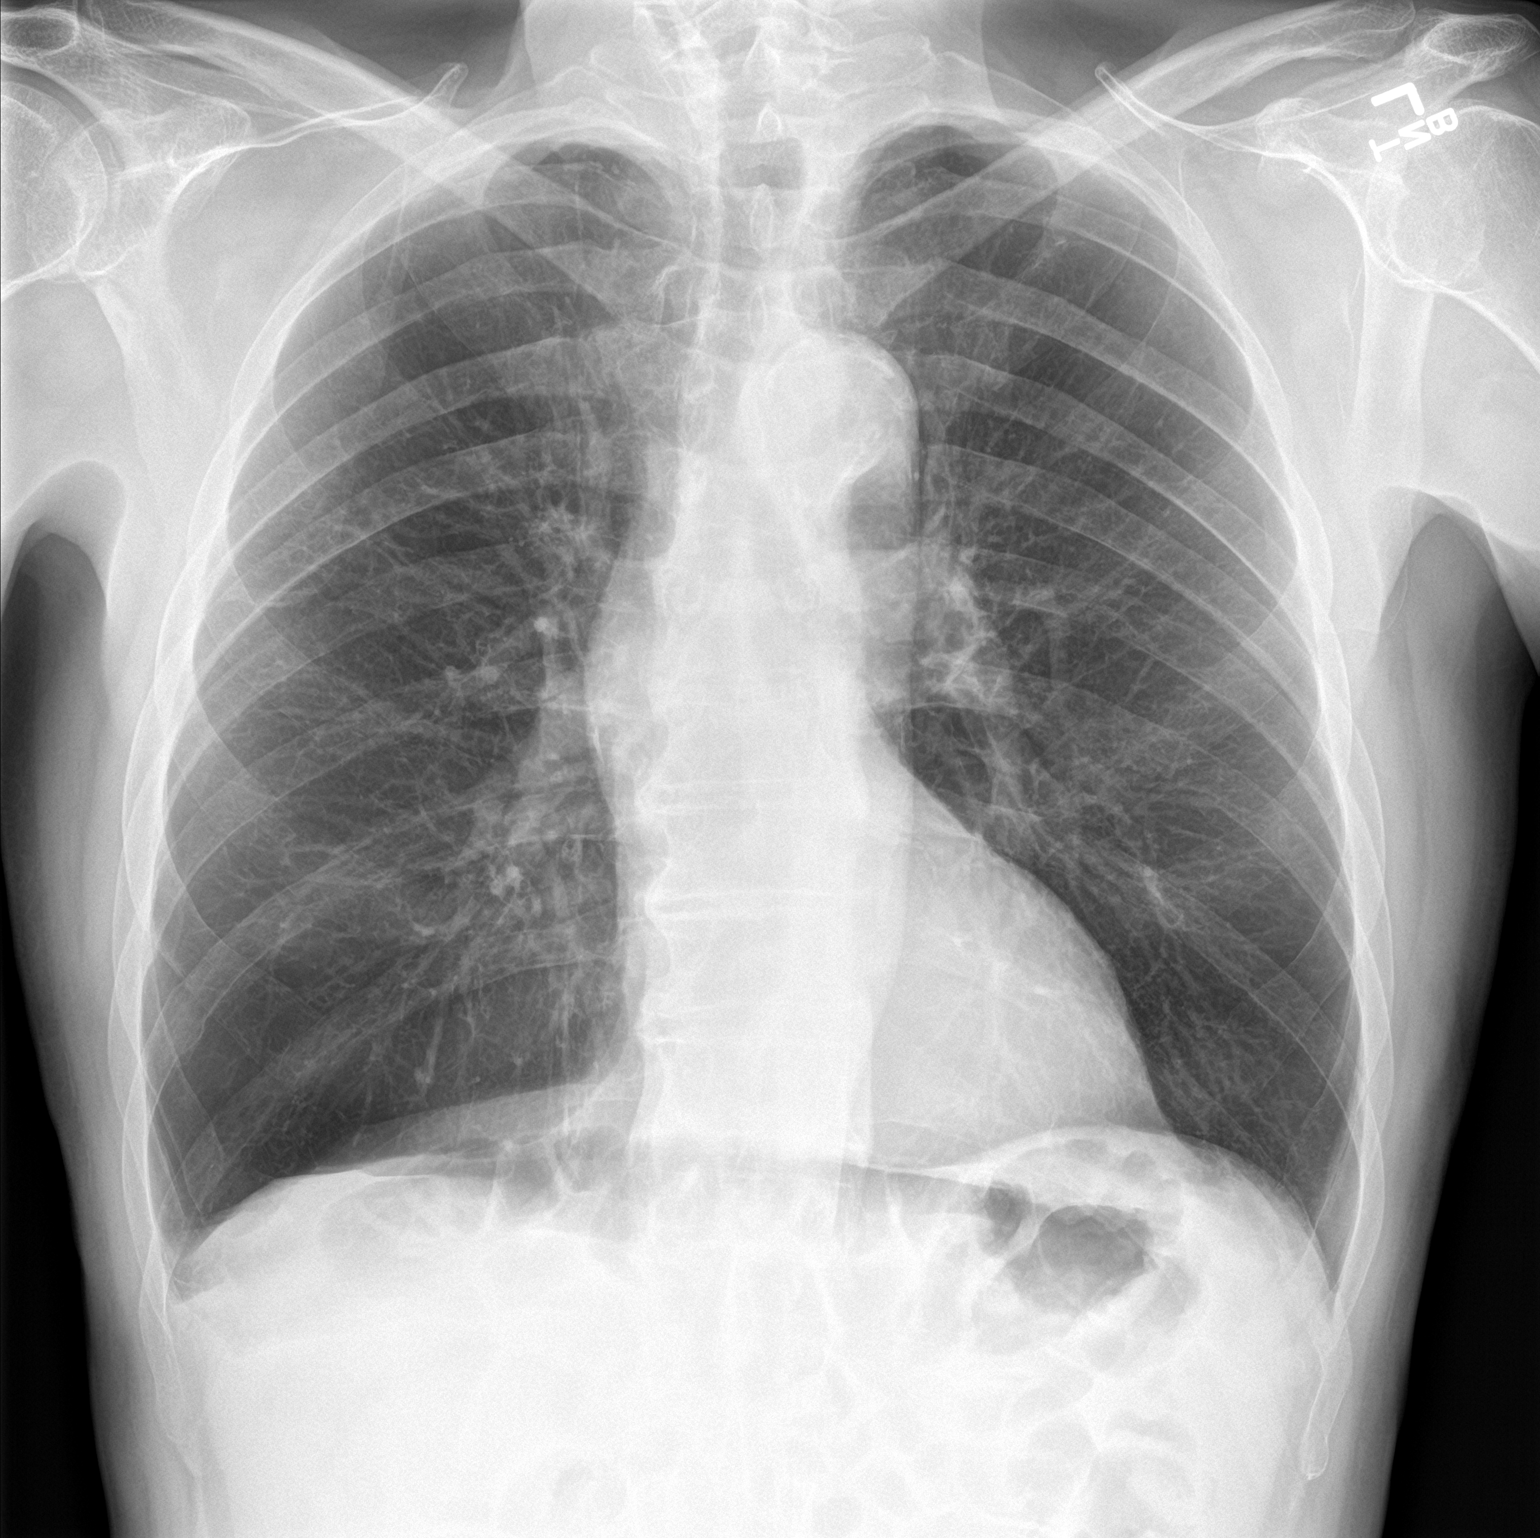

[chest lat]
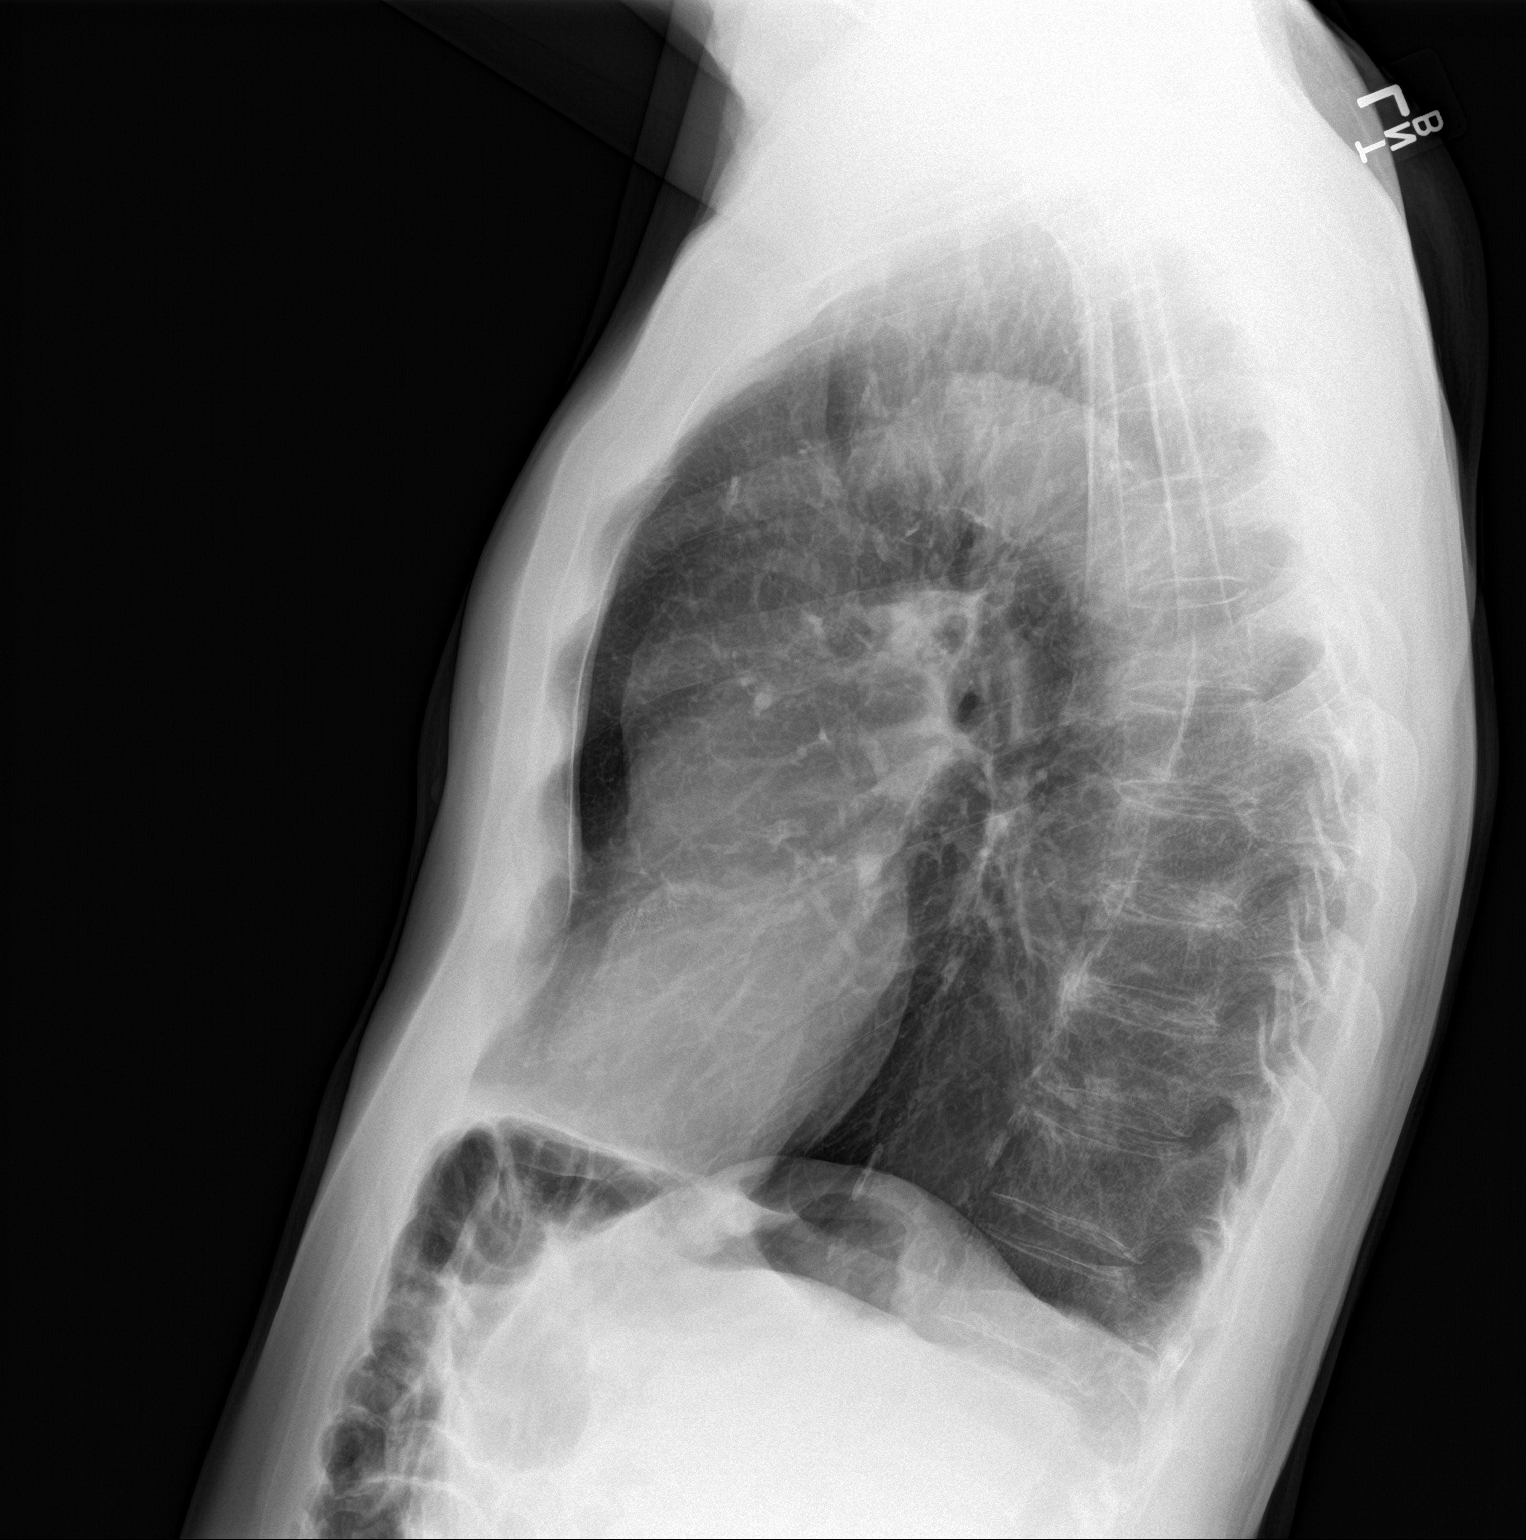

[2 of 2 positions shown; findings below may reference images not displayed]

FINDINGS: Cardiac silhouette is normal in size and configuration. No
mediastinal or hilar masses or evidence of adenopathy.

The lungs are hyperexpanded but clear. No pleural effusion or
pneumothorax.

Skeletal structures are intact.
IMPRESSION: 1. No acute cardiopulmonary disease.
2. Lung hyperexpansion suggests COPD.

## 2017-08-23 ENCOUNTER — Other Ambulatory Visit: Payer: Self-pay | Admitting: Cardiovascular Disease

## 2017-10-04 ENCOUNTER — Other Ambulatory Visit: Payer: Self-pay | Admitting: Cardiovascular Disease

## 2017-10-18 ENCOUNTER — Encounter: Payer: Self-pay | Admitting: Internal Medicine

## 2017-11-22 ENCOUNTER — Other Ambulatory Visit: Payer: Self-pay | Admitting: Cardiovascular Disease

## 2017-12-02 ENCOUNTER — Ambulatory Visit: Payer: Medicare HMO | Admitting: Cardiovascular Disease

## 2017-12-02 ENCOUNTER — Encounter: Payer: Self-pay | Admitting: Cardiovascular Disease

## 2017-12-02 VITALS — BP 150/82 | HR 60 | Ht 74.5 in | Wt 163.1 lb

## 2017-12-02 DIAGNOSIS — I2510 Atherosclerotic heart disease of native coronary artery without angina pectoris: Secondary | ICD-10-CM

## 2017-12-02 NOTE — Progress Notes (Signed)
Cardiology Office Note Date:  12/02/2017   ID:  [name], [name] [birth year], MRN [id number]  PCP:  Sinda Du, MD  Cardiologist:  Sherren Mocha, MD    Chief Complaint  Patient presents with  . Shortness of Breath     History of Present Illness: Thomas Mosley is a 72 y.o. male who presents for follow-up of coronary artery disease.  The patient initially presented in 2017 with a non-STEMI and was found to have multivessel coronary artery disease with 50% LAD stenosis, chronic total occlusion of the circumflex, and severe calcific stenosis of the proximal RCA.  He was treated with atherectomy and stenting of the right coronary artery.  Clopidogrel was discontinued 1 year ago.  The patient is here with his wife today.  He is been doing well.  He has no cardiac-related complaints.  He specifically denies chest pain or chest pressure, leg swelling, or heart palpitations.  He denies dizziness or syncope.  He does have chronic shortness of breath with activity that is related to COPD.  Reports no change in the past year.  Past Medical History:  Diagnosis Date  . COPD (chronic obstructive pulmonary disease) (HCC)    not on home o2  . Hypercholesterolemia   . Hypertension     Past Surgical History:  Procedure Laterality Date  . CARDIAC CATHETERIZATION N/A 10/02/2015   Procedure: Left Heart Cath and Coronary Angiography;  Surgeon: Sherren Mocha, MD;  Location: [place] [facility];  Service: Cardiovascular;  Laterality: N/A;  . CARDIAC CATHETERIZATION N/A 10/02/2015   Procedure: Intravascular Ultrasound/IVUS;  Surgeon: Sherren Mocha, MD;  Location: [place] [facility];  Service: Cardiovascular;  Laterality: N/A;  . CARDIAC CATHETERIZATION N/A 10/03/2015   Procedure: Coronary Stent Intervention Rotablator;  Surgeon: Sherren Mocha, MD;  Location: [place] [facility];  Service: Cardiovascular;  Laterality: N/A;  . CATARACT EXTRACTION W/PHACO  02/02/2011   Procedure: CATARACT EXTRACTION  PHACO AND INTRAOCULAR LENS PLACEMENT (IOC);  Surgeon: Tonny Branch;  Location: [facility];  Service: Ophthalmology;  Laterality: Left;  CDE 12.33  . COLONOSCOPY  02/13/2004   [id number] polyp at 30 cm resected/normal rectum. 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   . COLONOSCOPY N/A 11/23/2012   Procedure: COLONOSCOPY;  Surgeon: Daneil Dolin, MD;  [facility];  Service: Endoscopy;  Laterality: N/A;  12:15-rescheduled to 11:00am Darius Bump notified pt    Current Outpatient Medications  Medication Sig Dispense Refill  . aspirin EC 81 MG EC tablet Take 1 tablet (81 mg total) by mouth daily.    Marland Kitchen atorvastatin (LIPITOR) 80 MG tablet Take 80 mg by mouth daily.    Marland Kitchen losartan (COZAAR) 50 MG tablet Take 1 tablet (50 mg total) by mouth daily. Please keep upcoming appt in August for future refills. Thank you 90 tablet 0  . metoprolol succinate (TOPROL-XL) 100 MG 24 hr tablet Take 1 tablet (100 mg total) by mouth daily. Take with or immediately following a meal. 90 tablet 0  . nitroGLYCERIN (NITROSTAT) 0.4 MG SL tablet Place 1 tablet (0.4 mg total) under the tongue every 5 (five) minutes x 3 doses as needed for chest pain. 25 tablet 3  . pantoprazole (PROTONIX) 40 MG tablet Take 40 mg by mouth daily.  12   No current facility-administered medications for this visit.     Allergies:   Patient has no known allergies.   Social History:  The  patient  reports that he has been smoking cigarettes. He has a 10.00 pack-year smoking history. He has never used smokeless tobacco. He reports that he drinks about 1.0 standard drinks of alcohol per week. He reports that he does not use drugs.   Family History:  The patient's family history includes Heart attack in his brother and mother; Heart attack (age of onset: 83) in his brother; Stroke in his father.    ROS:  Please see the history of present illness. All other  systems are reviewed and negative.    PHYSICAL EXAM: VS:  BP (!) 150/82   Pulse 60   Ht 6' 2.5" (1.892 m)   Wt 163 lb 1.9 oz (74 kg)   SpO2 95%   BMI 20.66 kg/m  , BMI Body mass index is 20.66 kg/m. GEN: Well nourished, well developed, in no acute distress  HEENT: normal  Neck: no JVD, no masses. No carotid bruits Cardiac: RRR without murmur or gallop                Respiratory:  Prolonged expiration, normal work of breathing GI: soft, nontender, nondistended, + BS MS: no deformity or atrophy  Ext: no pretibial edema, pedal pulses 2+= bilaterally Skin: warm and dry, no rash Neuro:  Strength and sensation are intact Psych: euthymic mood, full affect  EKG:  EKG is ordered today. The ekg ordered today shows normal sinus rhythm 60 bpm, within normal limits.  Recent Labs: No results found for requested labs within last 8760 hours.   Lipid Panel     Component Value Date/Time   CHOL 131 10/03/2015 0430   TRIG 71 10/03/2015 0430   HDL 42 10/03/2015 0430   CHOLHDL 3.1 10/03/2015 0430   VLDL 14 10/03/2015 0430   LDLCALC 75 10/03/2015 0430      Wt Readings from Last 3 Encounters:  12/02/17 163 lb 1.9 oz (74 kg)  10/12/16 164 lb (74.4 kg)  02/06/16 158 lb 12.8 oz (72 kg)     ASSESSMENT AND PLAN: 1.  Coronary artery disease, native vessel, without angina: The patient is stable from a cardiac perspective with no complaints today.  He continues on aspirin, high intensity statin drug, and ARB, and a beta-blocker.  2.  Hyperlipidemia: His most recent labs are reviewed with a cholesterol of 125, HDL 47, LDL 63, and triglycerides 70.  3.  Hypertension: Treated with losartan and metoprolol succinate.  Current medicines are reviewed with the patient today.  The patient does not have concerns regarding medicines.  Labs/ tests ordered today include:  No orders of the defined types were placed in this encounter.   Disposition:   FU one year  Signed, Sherren Mocha, MD    12/02/2017 3:18 PM    [address], [place], [place]  [address] Phone: [phone]; Fax: [phone]

## 2017-12-02 NOTE — Patient Instructions (Signed)

## 2018-01-01 ENCOUNTER — Other Ambulatory Visit: Payer: Self-pay | Admitting: Cardiovascular Disease

## 2018-01-02 ENCOUNTER — Other Ambulatory Visit: Payer: Self-pay | Admitting: Cardiovascular Disease

## 2018-02-24 ENCOUNTER — Other Ambulatory Visit: Payer: Self-pay | Admitting: Cardiovascular Disease

## 2018-02-25 ENCOUNTER — Other Ambulatory Visit: Payer: Self-pay

## 2018-03-28 ENCOUNTER — Other Ambulatory Visit (HOSPITAL_COMMUNITY): Payer: Self-pay | Admitting: Pulmonary Disease

## 2018-03-28 ENCOUNTER — Ambulatory Visit (HOSPITAL_COMMUNITY)
Admission: RE | Admit: 2018-03-28 | Discharge: 2018-03-28 | Disposition: A | Discharge status: Home / Self Care | Payer: Medicare HMO | Source: Ambulatory Visit | Attending: Pulmonary Disease | Admitting: Pulmonary Disease

## 2018-03-28 DIAGNOSIS — J441 Chronic obstructive pulmonary disease with (acute) exacerbation: Secondary | ICD-10-CM

## 2018-04-04 LAB — COLOGUARD: Cologuard: NEGATIVE

## 2018-10-03 ENCOUNTER — Encounter: Payer: Self-pay | Admitting: *Deleted

## 2018-10-07 ENCOUNTER — Other Ambulatory Visit: Payer: Self-pay | Admitting: Internal Medicine

## 2018-10-07 ENCOUNTER — Other Ambulatory Visit: Payer: Self-pay

## 2018-10-07 DIAGNOSIS — Z20822 Contact with and (suspected) exposure to covid-19: Secondary | ICD-10-CM

## 2018-10-13 LAB — NOVEL CORONAVIRUS, NAA: SARS-CoV-2, NAA: NOT DETECTED

## 2018-11-16 ENCOUNTER — Telehealth: Payer: Self-pay | Admitting: Cardiovascular Disease

## 2018-11-16 MED ORDER — LOSARTAN POTASSIUM 50 MG PO TABS: ORAL_TABLET | ORAL | 0 refills | Status: DC

## 2018-11-16 NOTE — Telephone Encounter (Signed)
 *  STAT* If patient is at the pharmacy, call can be transferred to refill team.   1. Which medications need to be refilled? (please list name of each medication and dose if known) losartan (COZAAR) 50 MG tablet  2. Which pharmacy/location (including street and city if local pharmacy) [facility]  3. Do they need a 30 day or 90 day supply? [facility]

## 2018-11-16 NOTE — Telephone Encounter (Signed)
Pt's medication was sent to pt's pharmacy as requested. Confirmation received.  °

## 2018-11-17 ENCOUNTER — Other Ambulatory Visit: Payer: Self-pay

## 2018-11-17 MED ORDER — LOSARTAN POTASSIUM 50 MG PO TABS: ORAL_TABLET | ORAL | 0 refills | Status: DC

## 2018-11-17 MED ORDER — LOSARTAN POTASSIUM 50 MG PO TABS: 25.0000 mg | ORAL_TABLET | Freq: Two times a day (BID) | ORAL | 0 refills | Status: DC

## 2018-11-17 NOTE — Telephone Encounter (Signed)
[facility]

## 2018-11-17 NOTE — Telephone Encounter (Signed)
Pt's medication was resent to pt's requested pharmacy. Confirmation received.

## 2018-11-17 NOTE — Addendum Note (Signed)
Addended by: Derl Barrow on: 11/17/2018 09:44 AM   Modules accepted: Orders

## 2018-11-17 NOTE — Telephone Encounter (Signed)
[facility] Burt Knack to send in new Rx for Losartan 50 mg tablet, because this medication is unavailable and they wanted to know if Dr. Burt Knack could prescribe Losartan 25 mg tablet, taking 2 tablet daily. Please address

## 2018-11-17 NOTE — Telephone Encounter (Signed)
Refills was sent to wrong location. [id number] [place] [place].  Patient called he is out of medication.

## 2018-11-21 ENCOUNTER — Other Ambulatory Visit: Payer: Self-pay

## 2018-11-21 MED ORDER — LOSARTAN POTASSIUM 25 MG PO TABS: 50.0000 mg | ORAL_TABLET | Freq: Every day | ORAL | 3 refills | Status: DC

## 2018-11-21 NOTE — Telephone Encounter (Signed)
Losartan 25 mg (2 tablets) daily called in to pharmacy.

## 2018-12-06 ENCOUNTER — Ambulatory Visit: Payer: Medicare HMO | Admitting: Physician Assistant

## 2018-12-15 ENCOUNTER — Other Ambulatory Visit: Payer: Self-pay

## 2018-12-15 DIAGNOSIS — U071 COVID-19: Secondary | ICD-10-CM

## 2018-12-16 LAB — NOVEL CORONAVIRUS, NAA: SARS-CoV-2, NAA: NOT DETECTED

## 2018-12-19 NOTE — Progress Notes (Signed)
Cardiology Office Note:    Date:  12/20/2018   ID:  Thomas Mosley, DOB 1945-04-26, MRN [id number]  PCP:  Sinda Du, MD  Cardiologist:  Sherren Mocha, MD   Electrophysiologist:  None   Referring MD: Sinda Du, MD   Chief Complaint  Patient presents with  . Follow-up    CAD     History of Present Illness:    Thomas Mosley is a 73 y.o. male with :  Coronary artery disease   S/p NSTEMI in 2017 s/p atherectomy and DES to RCA  LCx chronically occluded   COPD  Hyperlipidemia  Hypertension   [name] was last seen by Dr.Cooper in 11/2017.  He returns for follow-up.  Since last seen, he has done well.  He has not had chest pain, significant shortness of breath, syncope, orthopnea or leg swelling.  He plays golf on a regular basis.  He also does a lot of traveling.  He just returned from [place] and got a COVID test when he got back.  He just found out this is negative.  Prior CV studies:   The following studies were reviewed today:  Cardiac catheterization 10/03/15 LAD prox 50; heavy conc calcification  LCx prox 100; L-L collats RCA ost 90; RPLB1 60 PCI:  Rotational atherectomy and 3.5 x 20 mm Synergy DES to RCA  Echocardiogram 10/03/15 EF 55-60, Gr 1 DD, mild AI, mild MR   Past Medical History:  Diagnosis Date  . COPD    not on home o2  . Coronary artery disease    NSTEMI 6/17 >> LHC: pLAD 50, pLCx 100 (L-L collats), oRCA 90, RPLB1 60 >> PCI:  atherectomy and DES to RCA // Echo 6/17: EF 55-60, Gr 1 DD, mild AI, mild MR  . Hypercholesterolemia   . Hypertension   . 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 SCC (squamous cell carcinoma) 02/20/2015   left inner nose- CX35FU   Surgical Hx: The patient  has a past surgical history that includes Cataract extraction w/PHACO (02/02/2011); Colonoscopy (02/13/2004); Colonoscopy (N/A, 11/23/2012); Cardiac catheterization (N/A, 10/02/2015); Cardiac catheterization (N/A, 10/02/2015); and  Cardiac catheterization (N/A, 10/03/2015).   Current Medications: Current Meds  Medication Sig  . aspirin EC 81 MG EC tablet Take 1 tablet (81 mg total) by mouth daily.  Marland Kitchen atorvastatin (LIPITOR) 80 MG tablet TAKE 1 TABLET BY MOUTH EVERY DAY  . losartan (COZAAR) 25 MG tablet Take 2 tablets (50 mg total) by mouth daily.  . metoprolol succinate (TOPROL-XL) 100 MG 24 hr tablet Take 1 tablet (100 mg total) by mouth daily. Take with or immediately following a meal.  . nitroGLYCERIN (NITROSTAT) 0.4 MG SL tablet Place 1 tablet (0.4 mg total) under the tongue every 5 (five) minutes x 3 doses as needed for chest pain.  . pantoprazole (PROTONIX) 40 MG tablet Take 40 mg by mouth daily.     Allergies:   Patient has no known allergies.   Social History   Tobacco Use  . Smoking status: Light Tobacco Smoker    Packs/day: 0.25    Years: 40.00    Pack years: 10.00    Types: Cigarettes  . Smokeless tobacco: Never Used  . Tobacco comment: smokes 1-2 cigarettes per day   Substance Use Topics  . Alcohol use: Yes    Alcohol/week: 1.0 standard drinks    Types: 1 Standard drinks or equivalent per week    Comment: glass of wine about  3 days a week   . Drug use: No     Family Hx: The patient's family history includes Heart attack in his brother and mother; Heart attack (age of onset: 46) in his brother; Stroke in his father. There is no history of Anesthesia problems, Hypotension, Malignant hyperthermia, Pseudochol deficiency, or Colon cancer.  ROS:   Please see the history of present illness.    ROS All other systems reviewed and are negative.   EKGs/Labs/Other Test Reviewed:    EKG:  EKG is  ordered today.  The ekg ordered today demonstrates normal sinus rhythm, heart rate 72, normal axis, septal Q waves, low voltage, QTC 435, no change from prior tracing  Recent Labs: No results found for requested labs within last 8760 hours.   Recent Lipid Panel Lab Results  Component Value Date/Time    CHOL 131 10/03/2015 04:30 AM   TRIG 71 10/03/2015 04:30 AM   HDL 42 10/03/2015 04:30 AM   CHOLHDL 3.1 10/03/2015 04:30 AM   LDLCALC 75 10/03/2015 04:30 AM     Physical Exam:    VS:  BP 140/70   Pulse 72   Ht 6' 2.5" (1.892 m)   Wt 164 lb 12.8 oz (74.8 kg)   SpO2 96%   BMI 20.88 kg/m     Wt Readings from Last 3 Encounters:  12/20/18 164 lb 12.8 oz (74.8 kg)  12/02/17 163 lb 1.9 oz (74 kg)  10/12/16 164 lb (74.4 kg)     Physical Exam  Constitutional: He is oriented to person, place, and time. He appears well-developed and well-nourished. No distress.  HENT:  Head: Normocephalic and atraumatic.  Neck: Neck supple. No JVD present. Carotid bruit is not present.  Cardiovascular: Normal rate, regular rhythm, S1 normal and S2 normal.  No murmur heard. Pulmonary/Chest: Breath sounds normal. He has no rales.  Abdominal: Soft. There is no hepatomegaly.  Musculoskeletal:        General: No edema.  Neurological: He is alert and oriented to person, place, and time.  Skin: Skin is warm and dry.    ASSESSMENT & PLAN:    1. Coronary artery disease involving native coronary artery of native heart without angina pectoris History of myocardial infarction in 2017 treated with drug-eluting stent to the RCA.  He has a chronically occluded LCx.  This is managed medically.  He is doing well without anginal symptoms.  Continue aspirin, atorvastatin, beta-blocker.  2. Essential hypertension Borderline control.  We discussed the goal of <130/80.  He notes his blood pressure is much better than it has been in the past.  Continue to monitor for now.  If his systolic pressure continues to run >130, consider increasing losartan to 75 mg daily.  3. Hyperlipidemia, unspecified hyperlipidemia type LDL in November 2019 was 71.  Continue atorvastatin 80 mg.  His primary care physician will obtain fasting lipids again September of this year.   Dispo:  Return in about 1 year (around 12/20/2019) for Routine  Follow Up, w/ Dr. Burt Knack, or Richardson Dopp, PA-C, (virtual or in-person).   Medication Adjustments/Labs and Tests Ordered: Current medicines are reviewed at length with the patient today.  Concerns regarding medicines are outlined above.  Tests Ordered: Orders Placed This Encounter  Procedures  . EKG 12-Lead   Medication Changes: No orders of the defined types were placed in this encounter.   Signed, Richardson Dopp, PA-C  12/20/2018 1:45 PM    [facility] [place],  [place]  [address] Phone: [phone]; Fax: [phone]

## 2018-12-20 ENCOUNTER — Ambulatory Visit (INDEPENDENT_AMBULATORY_CARE_PROVIDER_SITE_OTHER): Payer: Medicare HMO | Admitting: Physician Assistant

## 2018-12-20 ENCOUNTER — Other Ambulatory Visit: Payer: Self-pay

## 2018-12-20 ENCOUNTER — Encounter: Payer: Self-pay | Admitting: Physician Assistant

## 2018-12-20 VITALS — BP 140/70 | HR 72 | Ht 74.5 in | Wt 164.8 lb

## 2018-12-20 DIAGNOSIS — I2510 Atherosclerotic heart disease of native coronary artery without angina pectoris: Secondary | ICD-10-CM | POA: Diagnosis not present

## 2018-12-20 DIAGNOSIS — E785 Hyperlipidemia, unspecified: Secondary | ICD-10-CM | POA: Diagnosis not present

## 2018-12-20 DIAGNOSIS — I10 Essential (primary) hypertension: Secondary | ICD-10-CM

## 2018-12-20 NOTE — Patient Instructions (Signed)
Medication Instructions:  No changes.  Continue current medications.  If you need a refill on your cardiac medications before your next appointment, please call your pharmacy.   Lab work: None  If you have labs (blood work) drawn today and your tests are completely normal, you will receive your results only by: Marland Kitchen MyChart Message (if you have MyChart) OR . A paper copy in the mail If you have any lab test that is abnormal or we need to change your treatment, we will call you to review the results.  Testing/Procedures: None  Follow-Up: At [facility], you and your health needs are our priority.  As part of our continuing mission to provide you with exceptional heart care, we have created designated Provider Care Teams.  These Care Teams include your primary Cardiologist (physician) and Advanced Practice Providers (APPs -  Physician Assistants and Nurse Practitioners) who all work together to provide you with the care you need, when you need it. You will need a follow up appointment in:  12 months.  Please call our office 2 months in advance to schedule this appointment.  You may see Sherren Mocha, MD or Richardson Dopp, PA-C   Any Other Special Instructions Will Be Listed Below (If Applicable).

## 2018-12-28 ENCOUNTER — Other Ambulatory Visit: Payer: Self-pay | Admitting: Cardiovascular Disease

## 2019-03-03 LAB — COMPREHENSIVE METABOLIC PANEL
Albumin: 4.3 (ref 3.5–5.0)
Calcium: 9.3 (ref 8.7–10.7)
GFR calc Af Amer: 96
GFR calc non Af Amer: 83
Globulin: 2.8

## 2019-03-03 LAB — BASIC METABOLIC PANEL
BUN: 16 (ref 4–21)
CO2: 25 — AB (ref 13–22)
Chloride: 104 (ref 99–108)
Creatinine: 0.9 (ref 0.6–1.3)
Glucose: 105
Potassium: 4.6 (ref 3.4–5.3)
Sodium: 138 (ref 137–147)

## 2019-03-03 LAB — HEPATIC FUNCTION PANEL
ALT: 22 (ref 10–40)
AST: 26 (ref 14–40)
Alkaline Phosphatase: 92 (ref 25–125)
Bilirubin, Total: 0.9

## 2019-03-03 LAB — PSA: PSA: 0.8

## 2019-03-03 LAB — LIPID PANEL
Cholesterol: 130 (ref 0–200)
HDL: 47 (ref 35–70)
LDL Cholesterol: 68
LDl/HDL Ratio: 2.8
Triglycerides: 70 (ref 40–160)

## 2019-03-03 LAB — CBC AND DIFFERENTIAL
HCT: 43 (ref 41–53)
Hemoglobin: 14.9 (ref 13.5–17.5)
Neutrophils Absolute: 5863
Platelets: 208 (ref 150–399)
WBC: 8.2

## 2019-03-03 LAB — TSH: TSH: 4.12 (ref 0.41–5.90)

## 2019-03-03 LAB — CBC: RBC: 4.81 (ref 3.87–5.11)

## 2019-03-03 LAB — HEMOGLOBIN A1C: Hemoglobin A1C: 4.8

## 2019-05-10 ENCOUNTER — Other Ambulatory Visit: Payer: Self-pay | Admitting: Cardiovascular Disease

## 2019-05-25 ENCOUNTER — Telehealth: Payer: Self-pay | Admitting: Cardiovascular Disease

## 2019-05-25 MED ORDER — LOSARTAN POTASSIUM 50 MG PO TABS: 50.0000 mg | ORAL_TABLET | Freq: Every day | ORAL | 1 refills | Status: DC

## 2019-05-25 NOTE — Telephone Encounter (Signed)
New message   Pt c/o medication issue:  1. Name of Medication: losartan (COZAAR) 25 MG tablet  2. How are you currently taking this medication (dosage and times per day)? As written  3. Are you having a reaction (difficulty breathing--STAT)? No   4. What is your medication issue? Patient wife states that the patient needs a new [id number] [place], [place]

## 2019-06-08 ENCOUNTER — Encounter: Payer: Self-pay | Admitting: Family Medicine

## 2019-06-11 ENCOUNTER — Other Ambulatory Visit: Payer: Self-pay

## 2019-06-11 ENCOUNTER — Ambulatory Visit
Admission: EM | Admit: 2019-06-11 | Discharge: 2019-06-11 | Disposition: A | Discharge status: Home / Self Care | Payer: Medicare HMO

## 2019-06-11 DIAGNOSIS — Z760 Encounter for issue of repeat prescription: Secondary | ICD-10-CM | POA: Diagnosis not present

## 2019-06-11 MED ORDER — UMECLIDINIUM-VILANTEROL 62.5-25 MCG/INH IN AEPB: 1.0000 | INHALATION_SPRAY | Freq: Every day | RESPIRATORY_TRACT | 1 refills | Status: AC

## 2019-06-11 NOTE — ED Triage Notes (Signed)
Pt presents to UC w/ c/o medication refill for anoro ellipta. Hx of COPD. No exacerbated symptoms at this time.

## 2019-06-11 NOTE — Discharge Instructions (Addendum)
Take medication as prescribed Return for worsening of symptoms

## 2019-06-11 NOTE — ED Provider Notes (Signed)
[facility]    CSN: [id number] Arrival date & time: 06/11/19  1334      History   Chief Complaint Chief Complaint  Patient presents with  . Medication Refill    HPI Thomas Mosley is a 74 y.o. male.   Who presented to the urgent care with a complaint of medication refill.  Patient states he is taking Anoro Ellipta for COPD maintenance.  Reported medication was prescribed by Dr.Hawkins that has retired.  Currently does not have a PCP.  Denies chills, fever nausea, vomiting, diarrhea, chest tightness, chest pain, shortness of breath.     Past Medical History:  Diagnosis Date  . COPD    not on home o2  . Coronary artery disease    NSTEMI 6/17 >> LHC: pLAD 50, pLCx 100 (L-L collats), oRCA 90, RPLB1 60 >> PCI:  atherectomy and DES to RCA // Echo 6/17: EF 55-60, Gr 1 DD, mild AI, mild MR  . Hypercholesterolemia   . Hypertension   . 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 SCC (squamous cell carcinoma) 02/20/2015   left inner nose- CX35FU    Patient Active Problem List   Diagnosis Date Noted  . Essential hypertension 10/04/2015  . Tobacco abuse 10/04/2015  . Chest pain 10/02/2015  . Hyperglycemia 10/02/2015  . Acute non Q wave MI (myocardial infarction), initial episode of care ([place]) 10/02/2015  . NSTEMI (non-ST elevated myocardial infarction) ([place])   . Personal history of colonic polyps 11/01/2012    Past Surgical History:  Procedure Laterality Date  . CARDIAC CATHETERIZATION N/A 10/02/2015   Procedure: Left Heart Cath and Coronary Angiography;  Surgeon: Sherren Mocha, MD;  Location: [place] [facility];  Service: Cardiovascular;  Laterality: N/A;  . CARDIAC CATHETERIZATION N/A 10/02/2015   Procedure: Intravascular Ultrasound/IVUS;  Surgeon: Sherren Mocha, MD;  Location: [place] [facility];  Service: Cardiovascular;  Laterality: N/A;  . CARDIAC CATHETERIZATION N/A 10/03/2015   Procedure: Coronary Stent Intervention Rotablator;   Surgeon: Sherren Mocha, MD;  Location: [place] [facility];  Service: Cardiovascular;  Laterality: N/A;  . CATARACT EXTRACTION W/PHACO  02/02/2011   Procedure: CATARACT EXTRACTION PHACO AND INTRAOCULAR LENS PLACEMENT (IOC);  Surgeon: Tonny Branch;  Location: [facility];  Service: Ophthalmology;  Laterality: Left;  CDE 12.33  . COLONOSCOPY  02/13/2004   [id number] polyp at 30 cm resected/normal rectum. 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   . COLONOSCOPY N/A 11/23/2012   Procedure: COLONOSCOPY;  Surgeon: Daneil Dolin, MD;  [facility];  Service: Endoscopy;  Laterality: N/A;  12:15-rescheduled to 11:00am Darius Bump notified pt       Home Medications    Prior to Admission medications   Medication Sig Start Date End Date Taking? Authorizing Provider  aspirin EC 81 MG EC tablet Take 1 tablet (81 mg total) by mouth daily. 10/04/15   Cheryln Manly, NP  atorvastatin (LIPITOR) 80 MG tablet TAKE 1 TABLET BY MOUTH EVERY DAY 12/28/18   Sherren Mocha, MD  losartan (COZAAR) 50 MG tablet Take 1 tablet (50 mg total) by mouth daily. 05/25/19 05/19/20  Sherren Mocha, MD  metoprolol succinate (TOPROL-XL) 100 MG 24 hr tablet TAKE 1 TABLET (100 MG TOTAL) BY MOUTH DAILY. TAKE WITH OR IMMEDIATELY FOLLOWING A MEAL. 12/28/18   Sherren Mocha, MD  nitroGLYCERIN (NITROSTAT) 0.4 MG SL tablet Place 1 tablet (0.4 mg total)  under the tongue every 5 (five) minutes x 3 doses as needed for chest pain. 10/04/15   Cheryln Manly, NP  pantoprazole (PROTONIX) 40 MG tablet Take 40 mg by mouth daily. 09/30/15   [provider]  umeclidinium-vilanterol (ANORO ELLIPTA) 62.5-25 MCG/INH AEPB Inhale 1 puff into the lungs daily. 06/11/19   AvegnoDarrelyn Hillock, FNP    Family History Family History  Problem Relation Age of Onset  . Heart attack Mother   . Stroke Father   . Heart attack Brother   . Heart attack Brother 83   . Anesthesia problems Neg Hx   . Hypotension Neg Hx   . Malignant hyperthermia Neg Hx   . Pseudochol deficiency Neg Hx   . Colon cancer Neg Hx     Social History Social History   Tobacco Use  . Smoking status: Light Tobacco Smoker    Packs/day: 0.25    Years: 40.00    Pack years: 10.00    Types: Cigarettes  . Smokeless tobacco: Never Used  . Tobacco comment: smokes 1-2 cigarettes per day   Substance Use Topics  . Alcohol use: Yes    Alcohol/week: 1.0 standard drinks    Types: 1 Standard drinks or equivalent per week    Comment: glass of wine about 3 days a week   . Drug use: No     Allergies   Patient has no known allergies.   Review of Systems Review of Systems  Constitutional: Negative.   HENT: Negative.   Respiratory: Negative.   Cardiovascular: Negative.      Physical Exam Triage Vital Signs ED Triage Vitals  Enc Vitals Group     BP 06/11/19 1341 (!) 181/82     Pulse Rate 06/11/19 1341 70     Resp 06/11/19 1341 18     Temp 06/11/19 1341 97.8 F (36.6 C)     Temp Source 06/11/19 1341 Oral     SpO2 06/11/19 1341 97 %     Weight --      Height --      Head Circumference --      Peak Flow --      Pain Score 06/11/19 1346 0     Pain Loc --      Pain Edu? --      Excl. in [place]? --    No data found.  Updated Vital Signs BP (!) 181/82 (BP Location: Right Arm)   Pulse 70   Temp 97.8 F (36.6 C) (Oral)   Resp 18   SpO2 97%   Visual Acuity Right Eye Distance:   Left Eye Distance:   Bilateral Distance:    Right Eye Near:   Left Eye Near:    Bilateral Near:     Physical Exam Vitals and nursing note reviewed.  Constitutional:      General: He is not in acute distress.    Appearance: Normal appearance. He is normal weight. He is not ill-appearing or toxic-appearing.  Cardiovascular:     Rate and Rhythm: Normal rate and regular rhythm.     Pulses: Normal pulses.     Heart sounds: No murmur. No gallop.   Pulmonary:     Effort: Pulmonary  effort is normal. No respiratory distress.     Breath sounds: Normal breath sounds. No stridor. No wheezing, rhonchi or rales.  Chest:     Chest wall: No tenderness.  Neurological:     Mental Status: He is alert and oriented to person,  place, and time.      UC Treatments / Results  Labs (all labs ordered are listed, but only abnormal results are displayed) Labs Reviewed - No data to display  EKG   Radiology No results found.  Procedures Procedures (including critical care time)  Medications Ordered in UC Medications - No data to display  Initial Impression / Assessment and Plan / UC Course  I have reviewed the triage vital signs and the nursing notes.  Pertinent labs & imaging results that were available during my care of the patient were reviewed by me and considered in my medical decision making (see chart for details).   Patient is stable for discharge. Medication was filled with 1 refill Advised patient to establish care with a PCP Return for worsening of symptoms  Final Clinical Impressions(s) / UC Diagnoses   Final diagnoses:  Medication refill     Discharge Instructions     Take medication as prescribed Return for worsening of symptoms    ED Prescriptions    Medication Sig Dispense Auth. Provider   umeclidinium-vilanterol (ANORO ELLIPTA) 62.5-25 MCG/INH AEPB Inhale 1 puff into the lungs daily. 1 each Mailen Newborn, Darrelyn Hillock, FNP     PDMP not reviewed this encounter.   Emerson Monte, [place] 06/11/19 1418

## 2019-08-15 ENCOUNTER — Ambulatory Visit: Payer: Medicare HMO | Admitting: Physician Assistant

## 2019-09-18 ENCOUNTER — Ambulatory Visit: Payer: Medicare HMO | Admitting: Family Medicine

## 2019-11-14 ENCOUNTER — Other Ambulatory Visit: Payer: Self-pay | Admitting: Cardiovascular Disease

## 2019-12-20 ENCOUNTER — Ambulatory Visit: Payer: Medicare HMO | Admitting: Physician Assistant

## 2019-12-23 ENCOUNTER — Other Ambulatory Visit: Payer: Self-pay | Admitting: Cardiovascular Disease

## 2020-01-15 NOTE — Progress Notes (Signed)
Cardiology Office Note:    Date:  01/16/2020   ID:  Thomas Mosley, DOB Dec 10, 1945, MRN [id number]  PCP:  Celene Squibb, MD  [facility] Cardiologist:  Sherren Mocha, MD  [place] Electrophysiologist:  None   Referring MD: Sinda Du, MD   Chief Complaint:  Follow-up (CAD)    Patient Profile:    Thomas Mosley is a 74 y.o. male with:   Coronary artery disease  ? S/p NSTEMI in 2017 s/p atherectomy and DES to RCA ? LCx chronically occluded   COPD  Hyperlipidemia  Hypertension  Prior CV studies: Cardiac catheterization 2015/10/09 LAD prox 50; heavy conc calcification  LCx prox 100; L-L collats RCA ost 90; RPLB1 60 PCI:  Rotational atherectomy and 3.5 x 20 mm Synergy DES to RCA  Echocardiogram 2015-10-09 EF 55-60, Gr 1 DD, mild AI, mild MR   History of Present Illness:    Thomas Mosley was last seen in 12/2018.  He returns for routine follow up for his coronary artery disease.  He is here today with his wife.  He has chronic shortness of breath related to COPD.  This is unchanged.  He has not had chest discomfort, syncope, leg swelling.  He did quit smoking about 30 days ago.  He is to see Dr. Luan Pulling and is now seeing Dr. Nevada Crane.  He has a physical exam scheduled in November.    Past Medical History:  Diagnosis Date  . COPD    not on home o2  . Coronary artery disease    NSTEMI 6/17 >> LHC: pLAD 50, pLCx 100 (L-L collats), oRCA 90, RPLB1 60 >> PCI:  atherectomy and DES to RCA // Echo 6/17: EF 55-60, Gr 1 DD, mild AI, mild MR  . Hypercholesterolemia   . Hypertension   . 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 atorvastatin (LIPITOR) 80 MG tablet TAKE 1 TABLET BY MOUTH EVERY DAY  . losartan (COZAAR) 100 MG tablet Take 100 mg by mouth daily.  . metoprolol succinate (TOPROL-XL) 100 MG 24 hr tablet TAKE 1 TABLET  (100 MG TOTAL) BY MOUTH DAILY. TAKE WITH OR IMMEDIATELY FOLLOWING A MEAL.  . nitroGLYCERIN (NITROSTAT) 0.4 MG SL tablet Place 1 tablet (0.4 mg total) under the tongue every 5 (five) minutes x 3 doses as needed for chest pain.  . pantoprazole (PROTONIX) 40 MG tablet Take 40 mg by mouth daily.  Marland Kitchen umeclidinium-vilanterol (ANORO ELLIPTA) 62.5-25 MCG/INH AEPB Inhale 1 puff into the lungs daily.     Allergies:   Patient has no known allergies.   Social History   Tobacco Use  . Smoking status: Light Tobacco Smoker    Packs/day: 0.25    Years: 40.00    Pack years: 10.00    Types: Cigarettes  . Smokeless tobacco: Never Used  . Tobacco comment: smokes 1-2 cigarettes per day   Substance Use Topics  . Alcohol use: Yes    Alcohol/week: 1.0 standard drink    Types: 1 Standard drinks or equivalent per week    Comment: glass of wine about 3 days a week   . Drug use: No     Family Hx: The patient's family history includes Heart attack in his brother and mother; Heart attack (age of onset: 28) in his brother; Stroke in his father. There is  no history of Anesthesia problems, Hypotension, Malignant hyperthermia, Pseudochol deficiency, or Colon cancer.  ROS  See HPI  EKGs/Labs/Other Test Reviewed:    EKG:  EKG is   ordered today.  The ekg ordered today demonstrates sinus bradycardia, HR 52, normal axis, low voltage, nonspecific ST-T wave changes, QTC 401, similar to prior tracings  Recent Labs: 03/03/2019: ALT 22; BUN 16; Creatinine 0.9; Hemoglobin 14.9; Platelets 208; Potassium 4.6; Sodium 138; TSH 4.12   Recent Lipid Panel Lab Results  Component Value Date/Time   CHOL 130 03/03/2019 12:00 AM   TRIG 70 03/03/2019 12:00 AM   HDL 47 03/03/2019 12:00 AM   CHOLHDL 3.1 10/03/2015 04:30 AM   LDLCALC 68 03/03/2019 12:00 AM    Physical Exam:    VS:  BP 140/80   Pulse (!) 52   Ht 6' 2.5" (1.892 m)   Wt 154 lb (69.9 kg)   SpO2 100%   BMI 19.51 kg/m     Wt Readings from Last 3 Encounters:   01/16/20 154 lb (69.9 kg)  12/20/18 164 lb 12.8 oz (74.8 kg)  12/02/17 163 lb 1.9 oz (74 kg)     Constitutional:      Appearance: Healthy appearance. Not in distress.  Neck:     Vascular: JVD normal.  Pulmonary:     Breath sounds: No wheezing. No rales.  Cardiovascular:     Normal rate. Regular rhythm. Normal S1. Normal S2.     Murmurs: There is no murmur.  Edema:    Peripheral edema absent.  Abdominal:     Palpations: Abdomen is soft.  Skin:    General: Skin is warm and dry.  Neurological:     General: No focal deficit present.     Mental Status: Alert and oriented to person, place and time.     Cranial Nerves: Cranial nerves are intact.      ASSESSMENT & PLAN:    1. Coronary artery disease involving native coronary artery of native heart without angina pectoris History of myocardial infarction in 2017 treated with drug-eluting stent to the RCA.  He has a chronically occluded LCx.  He is doing well without anginal symptoms.  Continue atorvastatin, metoprolol succinate.  He has not been taking aspirin for quite some time.  I have asked him to resume aspirin 81 mg daily for secondary prevention.  Follow-up in 1 year.  2. Essential hypertension Blood pressure above target.  Continue current dose of losartan, metoprolol succinate.  Add HCTZ 12.5 mg daily.  Obtain BMET in 2 weeks.  If he tolerates this well, we can change his losartan to losartan/HCTZ for simplicity.  3. Hyperlipidemia, unspecified hyperlipidemia type LDL optimal on most recent lab work.  Continue current Rx.  Continue current dose of atorvastatin.  4. Chronic obstructive pulmonary disease, unspecified COPD type ([place]) Overall stable.  This is managed by primary care.  He recently quit smoking.  I congratulated him on this.   Dispo:  Return in about 1 year (around 01/15/2021) for Routine Follow Up, w/ Dr. Burt Knack, or Richardson Dopp, PA-C, in person.   Medication Adjustments/Labs and Tests Ordered: Current  medicines are reviewed at length with the patient today.  Concerns regarding medicines are outlined above.  Tests Ordered: Orders Placed This Encounter  Procedures  . Basic metabolic panel  . EKG 12-Lead   Medication Changes: Meds ordered this encounter  Medications  . aspirin 81 MG EC tablet    Sig: Take 1 tablet (81 mg total) by mouth  daily.    Dispense:  30 tablet  . hydrochlorothiazide (HYDRODIURIL) 25 MG tablet    Sig: Take 0.5 tablets (12.5 mg total) by mouth daily.    Dispense:  45 tablet    Refill:  3    Signed, Richardson Dopp, PA-C  01/16/2020 11:07 AM    [address], [place], [place]  [address] Phone: [phone]; Fax: [phone]

## 2020-01-16 ENCOUNTER — Other Ambulatory Visit: Payer: Self-pay

## 2020-01-16 ENCOUNTER — Encounter: Payer: Self-pay | Admitting: Physician Assistant

## 2020-01-16 ENCOUNTER — Ambulatory Visit (INDEPENDENT_AMBULATORY_CARE_PROVIDER_SITE_OTHER): Payer: Medicare HMO | Admitting: Physician Assistant

## 2020-01-16 VITALS — BP 140/80 | HR 52 | Ht 74.5 in | Wt 154.0 lb

## 2020-01-16 DIAGNOSIS — I2510 Atherosclerotic heart disease of native coronary artery without angina pectoris: Secondary | ICD-10-CM

## 2020-01-16 DIAGNOSIS — J449 Chronic obstructive pulmonary disease, unspecified: Secondary | ICD-10-CM

## 2020-01-16 DIAGNOSIS — E785 Hyperlipidemia, unspecified: Secondary | ICD-10-CM

## 2020-01-16 DIAGNOSIS — I10 Essential (primary) hypertension: Secondary | ICD-10-CM | POA: Diagnosis not present

## 2020-01-16 MED ORDER — HYDROCHLOROTHIAZIDE 25 MG PO TABS
12.5000 mg | ORAL_TABLET | Freq: Every day | ORAL | 3 refills | Status: DC
Start: 2020-01-16 — End: 2021-01-20

## 2020-01-16 MED ORDER — ASPIRIN 81 MG PO TBEC: 81.0000 mg | DELAYED_RELEASE_TABLET | Freq: Every day | ORAL | Status: DC

## 2020-01-16 NOTE — Patient Instructions (Addendum)
Medication Instructions:  Your physician has recommended you make the following change in your medication:   1) Start Hydrochlorothiazide 25 mg, 0.5 tablet by mouth once a day 2) Restart Aspirin 81 mg, 1 tablet by mouth once a day  *If you need a refill on your cardiac medications before your next appointment, please call your pharmacy*  Lab Work: Your physician recommends that you return for lab work in 2 weeks on 01/30/20 **The lab is open from 7:30AM-4:30PM** You may come anytime between these hours  Testing/Procedures: None ordered today  Follow-Up: At [facility], you and your health needs are our priority.  As part of our continuing mission to provide you with exceptional heart care, we have created designated Provider Care Teams.  These Care Teams include your primary Cardiologist (physician) and Advanced Practice Providers (APPs -  Physician Assistants and Nurse Practitioners) who all work together to provide you with the care you need, when you need it.  Your next appointment:   12 month(s)  The format for your next appointment:   In Person  Provider:   You may see Sherren Mocha, MD or Richardson Dopp, PA-C

## 2020-01-22 ENCOUNTER — Other Ambulatory Visit: Payer: Self-pay | Admitting: Cardiovascular Disease

## 2020-01-30 ENCOUNTER — Other Ambulatory Visit: Payer: Medicare HMO | Admitting: *Deleted

## 2020-01-30 ENCOUNTER — Other Ambulatory Visit: Payer: Self-pay

## 2020-01-30 DIAGNOSIS — E785 Hyperlipidemia, unspecified: Secondary | ICD-10-CM

## 2020-01-30 DIAGNOSIS — I10 Essential (primary) hypertension: Secondary | ICD-10-CM

## 2020-01-30 DIAGNOSIS — I2510 Atherosclerotic heart disease of native coronary artery without angina pectoris: Secondary | ICD-10-CM

## 2020-01-30 LAB — BASIC METABOLIC PANEL
BUN/Creatinine Ratio: 20 (ref 10–24)
BUN: 24 mg/dL (ref 8–27)
CO2: 27 mmol/L (ref 20–29)
Calcium: 9.6 mg/dL (ref 8.6–10.2)
Chloride: 97 mmol/L (ref 96–106)
Creatinine, Ser: 1.19 mg/dL (ref 0.76–1.27)
GFR calc Af Amer: 69 mL/min/{1.73_m2} (ref >59)
GFR calc non Af Amer: 59 mL/min/{1.73_m2} — ABNORMAL LOW (ref >59)
Glucose: 97 mg/dL (ref 65–99)
Potassium: 4.1 mmol/L (ref 3.5–5.2)
Sodium: 138 mmol/L (ref 134–144)

## 2020-05-14 ENCOUNTER — Encounter: Payer: Self-pay | Admitting: Physician Assistant

## 2020-05-14 ENCOUNTER — Ambulatory Visit: Payer: Medicare HMO | Admitting: Physician Assistant

## 2020-05-14 ENCOUNTER — Other Ambulatory Visit: Payer: Self-pay

## 2020-05-14 DIAGNOSIS — Z85828 Personal history of other malignant neoplasm of skin: Secondary | ICD-10-CM

## 2020-05-14 DIAGNOSIS — L820 Inflamed seborrheic keratosis: Secondary | ICD-10-CM

## 2020-05-14 DIAGNOSIS — D0439 Carcinoma in situ of skin of other parts of face: Secondary | ICD-10-CM

## 2020-05-14 DIAGNOSIS — Z1283 Encounter for screening for malignant neoplasm of skin: Secondary | ICD-10-CM | POA: Diagnosis not present

## 2020-05-14 DIAGNOSIS — C4492 Squamous cell carcinoma of skin, unspecified: Secondary | ICD-10-CM

## 2020-05-14 DIAGNOSIS — D485 Neoplasm of uncertain behavior of skin: Secondary | ICD-10-CM

## 2020-05-14 HISTORY — DX: Squamous cell carcinoma of skin, unspecified: C44.92

## 2020-05-14 NOTE — Patient Instructions (Signed)

## 2020-05-21 ENCOUNTER — Telehealth: Payer: Self-pay | Admitting: *Deleted

## 2020-05-21 ENCOUNTER — Encounter: Payer: Self-pay | Admitting: *Deleted

## 2020-05-21 ENCOUNTER — Encounter: Payer: Self-pay | Admitting: Physician Assistant

## 2020-05-21 NOTE — Progress Notes (Signed)
   Follow-Up Visit   Subjective  Thomas Mosley is a 75 y.o. male who presents for the following: Skin Problem (RIGHT SIDEBURN PREV TREATMENT PER PATIENT RIGHT OUTER EYE ISK?).   The following portions of the chart were reviewed this encounter and updated as appropriate:  Tobacco  Allergies  Meds  Problems  Med Hx  Surg Hx  Fam Hx      Objective  Well appearing patient in no apparent distress; mood and affect are within normal limits.  A focused examination was performed including neck up, arms. Relevant physical exam findings are noted in the Assessment and Plan.  Objective  neck up: Multiple white scar- clear  Objective  Right Parotid Area: Erythematous stuck-on, waxy papule or plaque.   Right Zygomatic Area  Objective  Right Zygomatic Area:  Dark crust on a pink base. 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 Right Zygomatic Area  Skin / nail biopsy - Right Zygomatic Area Type of biopsy: tangential   Informed consent: discussed and consent obtained   Timeout: patient name, date of birth, surgical site, and procedure verified   Procedure prep:  Patient was prepped and draped in usual sterile fashion (Non sterile) Prep type:  Chlorhexidine Anesthesia: the lesion was anesthetized in a standard fashion   Anesthetic:  1% lidocaine w/ epinephrine 1-100,000 local  infiltration Instrument used: flexible razor blade   Outcome: patient tolerated procedure well   Post-procedure details: wound care instructions given    Specimen 1 - Surgical pathology Differential Diagnosis: r/o sk Check Margins: No  Encounter for screening for malignant neoplasm of skin neck up, arms  Yearly skin exam    I, Nickolis Diel, PA-C, have reviewed all documentation's for this visit.  The documentation on 05/21/20 for the exam, diagnosis, procedures and orders are all accurate and complete.

## 2020-05-21 NOTE — Telephone Encounter (Signed)
Left message for patient to return our phone call.

## 2020-05-21 NOTE — Telephone Encounter (Signed)
-----   Message from Warren Danes, [place] sent at 05/21/2020  9:28 AM EST ----- 30 min

## 2020-05-24 ENCOUNTER — Other Ambulatory Visit (HOSPITAL_COMMUNITY): Payer: Self-pay | Admitting: Internal Medicine

## 2020-05-24 DIAGNOSIS — Q676 Pectus excavatum: Secondary | ICD-10-CM

## 2020-05-27 ENCOUNTER — Telehealth: Payer: Self-pay

## 2020-05-27 ENCOUNTER — Other Ambulatory Visit (HOSPITAL_COMMUNITY): Payer: Self-pay | Admitting: Radiology

## 2020-05-27 DIAGNOSIS — Q676 Pectus excavatum: Secondary | ICD-10-CM

## 2020-05-27 NOTE — Telephone Encounter (Signed)
-----   Message from Warren Danes, [place] sent at 05/21/2020  9:28 AM EST ----- 30 min

## 2020-05-27 NOTE — Telephone Encounter (Signed)
Phone call to patient with his pathology results. Voicemail left for patient to give the office a call back.  ?

## 2020-05-30 NOTE — Telephone Encounter (Signed)
PATH TO PATIENT MAY SURGERY MADE

## 2020-05-30 NOTE — Telephone Encounter (Signed)
-----   Message from Warren Danes, [place] sent at 05/21/2020  9:28 AM EST ----- 30 min

## 2020-05-31 ENCOUNTER — Other Ambulatory Visit: Payer: Self-pay

## 2020-05-31 ENCOUNTER — Other Ambulatory Visit (HOSPITAL_COMMUNITY)
Admission: RE | Admit: 2020-05-31 | Discharge: 2020-05-31 | Disposition: A | Discharge status: Home / Self Care | Payer: Medicare HMO | Source: Ambulatory Visit | Attending: Internal Medicine | Admitting: Internal Medicine

## 2020-05-31 ENCOUNTER — Ambulatory Visit (HOSPITAL_COMMUNITY)
Admission: RE | Admit: 2020-05-31 | Discharge: 2020-05-31 | Disposition: A | Discharge status: Home / Self Care | Payer: Medicare HMO | Source: Ambulatory Visit | Attending: Internal Medicine | Admitting: Internal Medicine

## 2020-05-31 DIAGNOSIS — I2510 Atherosclerotic heart disease of native coronary artery without angina pectoris: Secondary | ICD-10-CM | POA: Insufficient documentation

## 2020-05-31 DIAGNOSIS — I10 Essential (primary) hypertension: Secondary | ICD-10-CM | POA: Diagnosis not present

## 2020-05-31 DIAGNOSIS — F17200 Nicotine dependence, unspecified, uncomplicated: Secondary | ICD-10-CM | POA: Insufficient documentation

## 2020-05-31 DIAGNOSIS — Z20822 Contact with and (suspected) exposure to covid-19: Secondary | ICD-10-CM | POA: Insufficient documentation

## 2020-05-31 DIAGNOSIS — R0602 Shortness of breath: Secondary | ICD-10-CM | POA: Diagnosis not present

## 2020-05-31 DIAGNOSIS — J449 Chronic obstructive pulmonary disease, unspecified: Secondary | ICD-10-CM | POA: Insufficient documentation

## 2020-05-31 DIAGNOSIS — Q676 Pectus excavatum: Secondary | ICD-10-CM

## 2020-05-31 DIAGNOSIS — R059 Cough, unspecified: Secondary | ICD-10-CM | POA: Insufficient documentation

## 2020-05-31 LAB — SARS CORONAVIRUS 2 (TAT 6-24 HRS): SARS Coronavirus 2: NEGATIVE

## 2020-06-03 ENCOUNTER — Other Ambulatory Visit: Payer: Self-pay

## 2020-06-03 ENCOUNTER — Ambulatory Visit (HOSPITAL_COMMUNITY)
Admission: RE | Admit: 2020-06-03 | Discharge: 2020-06-03 | Disposition: A | Discharge status: Home / Self Care | Payer: Medicare HMO | Source: Ambulatory Visit | Attending: Internal Medicine | Admitting: Internal Medicine

## 2020-06-03 DIAGNOSIS — J449 Chronic obstructive pulmonary disease, unspecified: Secondary | ICD-10-CM | POA: Insufficient documentation

## 2020-06-03 DIAGNOSIS — Q676 Pectus excavatum: Secondary | ICD-10-CM | POA: Insufficient documentation

## 2020-06-03 DIAGNOSIS — F17210 Nicotine dependence, cigarettes, uncomplicated: Secondary | ICD-10-CM | POA: Diagnosis not present

## 2020-06-03 LAB — PULMONARY FUNCTION TEST
DL/VA % pred: 38 %
DL/VA: 1.5 ml/min/mmHg/L
DLCO unc % pred: 26 %
DLCO unc: 7.36 ml/min/mmHg
FEF 25-75 Post: 0.33 L/sec
FEF 25-75 Pre: 0.25 L/sec
FEF2575-%Change-Post: 34 %
FEF2575-%Pred-Post: 13 %
FEF2575-%Pred-Pre: 9 %
FEV1-%Change-Post: 11 %
FEV1-%Pred-Post: 28 %
FEV1-%Pred-Pre: 25 %
FEV1-Post: 1.02 L
FEV1-Pre: 0.91 L
FEV1FVC-%Change-Post: 1 %
FEV1FVC-%Pred-Pre: 35 %
FEV6-%Change-Post: 12 %
FEV6-%Pred-Post: 57 %
FEV6-%Pred-Pre: 50 %
FEV6-Post: 2.63 L
FEV6-Pre: 2.34 L
FEV6FVC-%Change-Post: 2 %
FEV6FVC-%Pred-Post: 72 %
FEV6FVC-%Pred-Pre: 70 %
FVC-%Change-Post: 9 %
FVC-%Pred-Post: 79 %
FVC-%Pred-Pre: 72 %
FVC-Post: 3.87 L
FVC-Pre: 3.53 L
Post FEV1/FVC ratio: 26 %
Post FEV6/FVC ratio: 68 %
Pre FEV1/FVC ratio: 26 %
Pre FEV6/FVC Ratio: 66 %
RV % pred: 198 %
RV: 5.55 L
TLC % pred: 118 %
TLC: 9.31 L

## 2020-06-03 MED ORDER — ALBUTEROL SULFATE (2.5 MG/3ML) 0.083% IN NEBU
2.5000 mg | INHALATION_SOLUTION | Freq: Once | RESPIRATORY_TRACT | Status: AC
  Administered 2020-06-03: 2.5 mg via RESPIRATORY_TRACT

## 2020-06-11 ENCOUNTER — Other Ambulatory Visit: Payer: Self-pay

## 2020-06-11 ENCOUNTER — Encounter: Payer: Self-pay | Admitting: Emergency Medicine

## 2020-06-11 ENCOUNTER — Emergency Department (HOSPITAL_COMMUNITY): Payer: Medicare HMO

## 2020-06-11 ENCOUNTER — Observation Stay (HOSPITAL_COMMUNITY)
Admission: EM | Admit: 2020-06-11 | Discharge: 2020-06-12 | Disposition: A | Discharge status: Home / Self Care | Payer: Medicare HMO | Attending: Internal Medicine | Admitting: Internal Medicine

## 2020-06-11 ENCOUNTER — Ambulatory Visit: Admission: EM | Admit: 2020-06-11 | Discharge: 2020-06-11 | Payer: Medicare HMO

## 2020-06-11 ENCOUNTER — Encounter (HOSPITAL_COMMUNITY): Payer: Self-pay | Admitting: Emergency Medicine

## 2020-06-11 DIAGNOSIS — I743 Embolism and thrombosis of arteries of the lower extremities: Secondary | ICD-10-CM | POA: Diagnosis not present

## 2020-06-11 DIAGNOSIS — I739 Peripheral vascular disease, unspecified: Secondary | ICD-10-CM

## 2020-06-11 DIAGNOSIS — I998 Other disorder of circulatory system: Secondary | ICD-10-CM

## 2020-06-11 DIAGNOSIS — Z86010 Personal history of colonic polyps: Secondary | ICD-10-CM | POA: Insufficient documentation

## 2020-06-11 DIAGNOSIS — I639 Cerebral infarction, unspecified: Secondary | ICD-10-CM

## 2020-06-11 DIAGNOSIS — W2209XA Striking against other stationary object, initial encounter: Secondary | ICD-10-CM | POA: Diagnosis not present

## 2020-06-11 DIAGNOSIS — J449 Chronic obstructive pulmonary disease, unspecified: Secondary | ICD-10-CM | POA: Insufficient documentation

## 2020-06-11 DIAGNOSIS — E785 Hyperlipidemia, unspecified: Secondary | ICD-10-CM | POA: Diagnosis not present

## 2020-06-11 DIAGNOSIS — Z7982 Long term (current) use of aspirin: Secondary | ICD-10-CM | POA: Insufficient documentation

## 2020-06-11 DIAGNOSIS — F17200 Nicotine dependence, unspecified, uncomplicated: Secondary | ICD-10-CM | POA: Diagnosis present

## 2020-06-11 DIAGNOSIS — T796XXA Traumatic ischemia of muscle, initial encounter: Secondary | ICD-10-CM | POA: Diagnosis not present

## 2020-06-11 DIAGNOSIS — Z85820 Personal history of malignant melanoma of skin: Secondary | ICD-10-CM | POA: Diagnosis not present

## 2020-06-11 DIAGNOSIS — F17210 Nicotine dependence, cigarettes, uncomplicated: Secondary | ICD-10-CM | POA: Diagnosis not present

## 2020-06-11 DIAGNOSIS — E7800 Pure hypercholesterolemia, unspecified: Secondary | ICD-10-CM | POA: Diagnosis present

## 2020-06-11 DIAGNOSIS — I10 Essential (primary) hypertension: Secondary | ICD-10-CM | POA: Insufficient documentation

## 2020-06-11 DIAGNOSIS — I2510 Atherosclerotic heart disease of native coronary artery without angina pectoris: Secondary | ICD-10-CM | POA: Diagnosis not present

## 2020-06-11 DIAGNOSIS — I70229 Atherosclerosis of native arteries of extremities with rest pain, unspecified extremity: Secondary | ICD-10-CM | POA: Diagnosis not present

## 2020-06-11 DIAGNOSIS — Z79899 Other long term (current) drug therapy: Secondary | ICD-10-CM | POA: Diagnosis not present

## 2020-06-11 DIAGNOSIS — Z20822 Contact with and (suspected) exposure to covid-19: Secondary | ICD-10-CM | POA: Insufficient documentation

## 2020-06-11 DIAGNOSIS — S8991XA Unspecified injury of right lower leg, initial encounter: Secondary | ICD-10-CM | POA: Diagnosis present

## 2020-06-11 HISTORY — DX: Nicotine dependence, unspecified, uncomplicated: F17.200

## 2020-06-11 HISTORY — DX: Cerebral infarction, unspecified: I63.9

## 2020-06-11 LAB — RESP PANEL BY RT-PCR (FLU A&B, COVID) ARPGX2
Influenza A by PCR: NEGATIVE
Influenza B by PCR: NEGATIVE
SARS Coronavirus 2 by RT PCR: NEGATIVE

## 2020-06-11 LAB — CBC WITH DIFFERENTIAL/PLATELET
Abs Immature Granulocytes: 0.04 10*3/uL (ref 0.00–0.07)
Basophils Absolute: 0.1 10*3/uL (ref 0.0–0.1)
Basophils Relative: 1 %
Eosinophils Absolute: 0.1 10*3/uL (ref 0.0–0.5)
Eosinophils Relative: 1 %
HCT: 41.9 % (ref 39.0–52.0)
Hemoglobin: 13.8 g/dL (ref 13.0–17.0)
Immature Granulocytes: 0 %
Lymphocytes Relative: 7 %
Lymphs Abs: 0.7 10*3/uL (ref 0.7–4.0)
MCH: 30.5 pg (ref 26.0–34.0)
MCHC: 32.9 g/dL (ref 30.0–36.0)
MCV: 92.5 fL (ref 80.0–100.0)
Monocytes Absolute: 1.8 10*3/uL — ABNORMAL HIGH (ref 0.1–1.0)
Monocytes Relative: 18 %
Neutro Abs: 7.4 10*3/uL (ref 1.7–7.7)
Neutrophils Relative %: 73 %
Platelets: 171 10*3/uL (ref 150–400)
RBC: 4.53 MIL/uL (ref 4.22–5.81)
RDW: 13.1 % (ref 11.5–15.5)
WBC: 10 10*3/uL (ref 4.0–10.5)
nRBC: 0 % (ref 0.0–0.2)

## 2020-06-11 LAB — COMPREHENSIVE METABOLIC PANEL
ALT: 15 U/L (ref 0–44)
AST: 19 U/L (ref 15–41)
Albumin: 3.5 g/dL (ref 3.5–5.0)
Alkaline Phosphatase: 92 U/L (ref 38–126)
Anion gap: 10 (ref 5–15)
BUN: 23 mg/dL (ref 8–23)
CO2: 27 mmol/L (ref 22–32)
Calcium: 8.6 mg/dL — ABNORMAL LOW (ref 8.9–10.3)
Chloride: 100 mmol/L (ref 98–111)
Creatinine, Ser: 0.96 mg/dL (ref 0.61–1.24)
GFR, Estimated: >60 mL/min (ref >60)
Glucose, Bld: 63 mg/dL — ABNORMAL LOW (ref 70–99)
Potassium: 3.7 mmol/L (ref 3.5–5.1)
Sodium: 137 mmol/L (ref 135–145)
Total Bilirubin: 0.7 mg/dL (ref 0.3–1.2)
Total Protein: 7.5 g/dL (ref 6.5–8.1)

## 2020-06-11 MED ORDER — STROKE: EARLY STAGES OF RECOVERY BOOK
Freq: Once | Status: DC
  Filled 2020-06-11: qty 1

## 2020-06-11 MED ORDER — ATORVASTATIN CALCIUM 40 MG PO TABS
80.0000 mg | ORAL_TABLET | Freq: Every day | ORAL | Status: DC
  Administered 2020-06-12: 80 mg via ORAL
  Filled 2020-06-11: qty 2

## 2020-06-11 MED ORDER — NICOTINE 14 MG/24HR TD PT24
14.0000 mg | MEDICATED_PATCH | Freq: Once | TRANSDERMAL | Status: AC
  Administered 2020-06-11: 14 mg via TRANSDERMAL
  Filled 2020-06-11: qty 1

## 2020-06-11 MED ORDER — PANTOPRAZOLE SODIUM 40 MG PO TBEC
40.0000 mg | DELAYED_RELEASE_TABLET | Freq: Every day | ORAL | Status: DC
  Administered 2020-06-12: 40 mg via ORAL
  Filled 2020-06-11: qty 1

## 2020-06-11 MED ORDER — ASPIRIN EC 81 MG PO TBEC
81.0000 mg | DELAYED_RELEASE_TABLET | Freq: Every day | ORAL | Status: DC
  Administered 2020-06-12: 81 mg via ORAL
  Filled 2020-06-11 (×2): qty 1

## 2020-06-11 MED ORDER — ACETAMINOPHEN 650 MG RE SUPP: 650.0000 mg | RECTAL | Status: DC | PRN

## 2020-06-11 MED ORDER — ACETAMINOPHEN 325 MG PO TABS: 650.0000 mg | ORAL_TABLET | ORAL | Status: DC | PRN

## 2020-06-11 MED ORDER — SENNOSIDES-DOCUSATE SODIUM 8.6-50 MG PO TABS: 1.0000 | ORAL_TABLET | Freq: Every evening | ORAL | Status: DC | PRN

## 2020-06-11 MED ORDER — IOHEXOL 350 MG/ML SOLN
100.0000 mL | Freq: Once | INTRAVENOUS | Status: AC | PRN
  Administered 2020-06-11: 100 mL via INTRAVENOUS

## 2020-06-11 MED ORDER — UMECLIDINIUM-VILANTEROL 62.5-25 MCG/INH IN AEPB
1.0000 | INHALATION_SPRAY | Freq: Every day | RESPIRATORY_TRACT | Status: DC
  Administered 2020-06-12: 1 via RESPIRATORY_TRACT
  Filled 2020-06-11: qty 14

## 2020-06-11 MED ORDER — ENOXAPARIN SODIUM 40 MG/0.4ML ~~LOC~~ SOLN
40.0000 mg | SUBCUTANEOUS | Status: DC
  Administered 2020-06-12: 40 mg via SUBCUTANEOUS
  Filled 2020-06-11: qty 0.4

## 2020-06-11 MED ORDER — SODIUM CHLORIDE 0.9 % IV SOLN: INTRAVENOUS | Status: DC

## 2020-06-11 MED ORDER — ACETAMINOPHEN 160 MG/5ML PO SOLN: 650.0000 mg | ORAL | Status: DC | PRN

## 2020-06-11 MED ORDER — LORAZEPAM 2 MG/ML IJ SOLN
1.0000 mg | Freq: Once | INTRAMUSCULAR | Status: AC
  Administered 2020-06-11: 1 mg via INTRAVENOUS
  Filled 2020-06-11: qty 1

## 2020-06-11 MED ORDER — NICOTINE 21 MG/24HR TD PT24
21.0000 mg | MEDICATED_PATCH | Freq: Every day | TRANSDERMAL | Status: DC
  Administered 2020-06-12: 21 mg via TRANSDERMAL
  Filled 2020-06-11: qty 1

## 2020-06-11 NOTE — ED Notes (Signed)
Pt to MRI at this time.

## 2020-06-11 NOTE — ED Notes (Signed)
Pt to xray at this time.

## 2020-06-11 NOTE — ED Triage Notes (Signed)
Pt sent over from urgent care for inability to find pulse in right leg and toes are blue. Pt states he hit a propane tank last Thursday and has been unable to walk on it since.

## 2020-06-11 NOTE — Progress Notes (Signed)
Pt arrived to floor via carelink. Pt alert and oriented. Vss. Pt plan of care reviewed with pt. No further questions at this time. Vascular paged to make aware of pt's arrival.

## 2020-06-11 NOTE — ED Notes (Addendum)
Pt taking cardiac monitor leads off, stating he is leaving. States he needs a cigarette. Explained to patient it is best if he stays here and try and get pt a nicotine patch. Pt agrees. MD made aware. Nicotine patch given per orders.

## 2020-06-11 NOTE — ED Provider Notes (Signed)
[facility] EMERGENCY DEPARTMENT Provider Note   CSN: [id number] Arrival date & time: 06/11/20  1157     History Chief Complaint  Patient presents with  . Leg Injury    Thomas Mosley is a 75 y.o. male.  Patient states that he hit his leg on a container and has been having some weakness in that leg since last Thursday.  He was seen at the urgent care today and they could not get a dorsalis pedis pulse and his foot was cold so they sent him over here  The history is provided by the patient and medical records. No language interpreter was used.  Leg Pain Location:  Leg Leg location:  R leg Pain details:    Quality:  Aching   Radiates to:  Does not radiate   Severity:  Mild   Onset quality:  Sudden   Timing:  Constant   Progression:  Worsening Chronicity:  New Dislocation: no   Associated symptoms: no back pain and no fatigue        Past Medical History:  Diagnosis Date  . COPD    not on home o2  . Coronary artery disease    NSTEMI 6/17 >> LHC: pLAD 50, pLCx 100 (L-L collats), oRCA 90, RPLB1 60 >> PCI:  atherectomy and DES to RCA // Echo 6/17: EF 55-60, Gr 1 DD, mild AI, mild MR  . Hypercholesterolemia   . Hypertension   . 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 Squamous cell carcinoma of skin 05/14/2020   in situ- right zygomatic area    Patient Active Problem List   Diagnosis Date Noted  . Essential hypertension 10/04/2015  . Tobacco abuse 10/04/2015  . Chest pain 10/02/2015  . Hyperglycemia 10/02/2015  . Acute non Q wave MI (myocardial infarction), initial episode of care ([place]) 10/02/2015  . NSTEMI (non-ST elevated myocardial infarction) ([place])   . Personal history of colonic polyps 11/01/2012    Past Surgical History:  Procedure Laterality Date  . CARDIAC CATHETERIZATION N/A 10/02/2015   Procedure: Left Heart Cath and Coronary Angiography;  Surgeon: Sherren Mocha, MD;  Location: [place] CV LAB;  Service: Cardiovascular;  Laterality: N/A;  . CARDIAC CATHETERIZATION N/A 10/02/2015   Procedure: Intravascular Ultrasound/IVUS;  Surgeon: Sherren Mocha, MD;  Location: [place] CV LAB;  Service: Cardiovascular;  Laterality: N/A;  . CARDIAC CATHETERIZATION N/A 10/03/2015   Procedure: Coronary Stent Intervention Rotablator;  Surgeon: Sherren Mocha, MD;  Location: [place] CV LAB;  Service: Cardiovascular;  Laterality: N/A;  . CATARACT EXTRACTION W/PHACO  02/02/2011   Procedure: CATARACT EXTRACTION PHACO AND INTRAOCULAR LENS PLACEMENT (IOC);  Surgeon: Tonny Branch;  Location: [facility] ORS;  Service: Ophthalmology;  Laterality: Left;  CDE 12.33  . COLONOSCOPY  02/13/2004   DHR:CBULAGTXMIWO polyp at 30 cm resected/normal rectum. Path received from [facility] stating "microscopic focus of intramucosal carcinoma in situ arising in tubular adenoma, margin clear.   . COLONOSCOPY N/A 11/23/2012   Procedure: COLONOSCOPY;  Surgeon: Daneil Dolin, MD;  Location: [facility] ENDO SUITE;  Service: Endoscopy;  Laterality: N/A;  12:15-rescheduled to 11:00am Darius Bump notified pt       Family History  Problem Relation Age of Onset  . Heart attack Mother   . Stroke Father   . Heart attack Brother   . Heart attack Brother 48  . Anesthesia problems Neg Hx   .  Hypotension Neg Hx   . Malignant hyperthermia Neg Hx   . Pseudochol deficiency Neg Hx   . Colon cancer Neg Hx     Social History   Tobacco Use  . Smoking status: Light Tobacco Smoker    Packs/day: 0.25    Years: 40.00    Pack years: 10.00    Types: Cigarettes  . Smokeless tobacco: Never Used  . Tobacco comment: smokes 1-2 cigarettes per day   Vaping Use  . Vaping Use: Never used  Substance Use Topics  . Alcohol use: Yes    Alcohol/week: 1.0 standard drink    Types: 1 Standard drinks or equivalent per week    Comment: glass of wine about 3 days a week   . Drug use: No    Home Medications Prior to Admission  medications   Medication Sig Start Date End Date Taking? Authorizing Provider  aspirin 81 MG EC tablet Take 1 tablet (81 mg total) by mouth daily. 01/16/20   Richardson Dopp T, PA-C  atorvastatin (LIPITOR) 80 MG tablet TAKE 1 TABLET BY MOUTH EVERY DAY 01/22/20   Sherren Mocha, MD  hydrochlorothiazide (HYDRODIURIL) 25 MG tablet Take 0.5 tablets (12.5 mg total) by mouth daily. 01/16/20   Richardson Dopp T, PA-C  losartan (COZAAR) 100 MG tablet Take 100 mg by mouth daily. 11/26/19   [provider]  metoprolol succinate (TOPROL-XL) 100 MG 24 hr tablet TAKE 1 TABLET (100 MG TOTAL) BY MOUTH DAILY. TAKE WITH OR IMMEDIATELY FOLLOWING A MEAL. 11/15/19   Sherren Mocha, MD  nitroGLYCERIN (NITROSTAT) 0.4 MG SL tablet Place 1 tablet (0.4 mg total) under the tongue every 5 (five) minutes x 3 doses as needed for chest pain. 10/04/15   Cheryln Manly, NP  pantoprazole (PROTONIX) 40 MG tablet Take 40 mg by mouth daily. 09/30/15   [provider]  umeclidinium-vilanterol (ANORO ELLIPTA) 62.5-25 MCG/INH AEPB Inhale 1 puff into the lungs daily. 06/11/19   Emerson Monte, FNP    Allergies    Patient has no known allergies.  Review of Systems   Review of Systems  Constitutional: Negative for appetite change and fatigue.  HENT: Negative for congestion, ear discharge and sinus pressure.   Eyes: Negative for discharge.  Respiratory: Negative for cough.   Cardiovascular: Negative for chest pain.  Gastrointestinal: Negative for abdominal pain and diarrhea.  Genitourinary: Negative for frequency and hematuria.  Musculoskeletal: Negative for back pain.       Weakness in right leg  Skin: Negative for rash.  Neurological: Negative for seizures and headaches.  Psychiatric/Behavioral: Negative for hallucinations.    Physical Exam Updated Vital Signs BP (!) 141/77 (BP Location: Right Arm)   Pulse 92   Temp 97.7 F (36.5 C) (Oral)   Resp (!) 22   Ht 6' 2.5" (1.892 m)   Wt 69.9 kg   SpO2 99%    BMI 19.52 kg/m   Physical Exam Vitals and nursing note reviewed.  Constitutional:      Appearance: He is well-developed.  HENT:     Head: Normocephalic.     Nose: Nose normal.  Eyes:     General: No scleral icterus.    Extraocular Movements: EOM normal.     Conjunctiva/sclera: Conjunctivae normal.  Neck:     Thyroid: No thyromegaly.  Cardiovascular:     Rate and Rhythm: Normal rate and regular rhythm.     Heart sounds: No murmur heard. No friction rub. No gallop.   Pulmonary:  Breath sounds: No stridor. No wheezing or rales.  Chest:     Chest wall: No tenderness.  Abdominal:     General: There is no distension.     Tenderness: There is no abdominal tenderness. There is no rebound.  Musculoskeletal:        General: No edema. Normal range of motion.     Cervical back: Neck supple.     Comments: Patient has mild weakness to right leg.  Right foot mildly cooler than left.  No dorsalis pedis felt on both feet but posterior tibial was dopplered.  Lymphadenopathy:     Cervical: No cervical adenopathy.  Skin:    Findings: No erythema or rash.  Neurological:     Mental Status: He is alert and oriented to person, place, and time.     Motor: No abnormal muscle tone.     Coordination: Coordination normal.  Psychiatric:        Mood and Affect: Mood and affect normal.        Behavior: Behavior normal.     ED Results / Procedures / Treatments   Labs (all labs ordered are listed, but only abnormal results are displayed) Labs Reviewed  CBC WITH DIFFERENTIAL/PLATELET - Abnormal; Notable for the following components:      Result Value   Monocytes Absolute 1.8 (*)    All other components within normal limits  COMPREHENSIVE METABOLIC PANEL - Abnormal; Notable for the following components:   Glucose, Bld 63 (*)    Calcium 8.6 (*)    All other components within normal limits    EKG None  Radiology CT Head Wo Contrast  Result Date: 06/11/2020 CLINICAL DATA:  Possible  stroke EXAM: CT HEAD WITHOUT CONTRAST TECHNIQUE: Contiguous axial images were obtained from the base of the skull through the vertex without intravenous contrast. Sagittal and coronal MPR images reconstructed from axial data set. COMPARISON:  01/15/2013 FINDINGS: Brain: Generalized atrophy. Normal ventricular morphology. No midline shift or mass effect. Small vessel chronic ischemic changes of deep cerebral white matter. Low-attenuation focus at posterior aspect of LEFT basal ganglia and LEFT external capsule consistent with infarct, favor subacute to old. No intracranial hemorrhage, mass lesion, or definite evidence of acute infarction. No extra-axial fluid collections. Vascular: No hyperdense vessels. Atherosclerotic calcifications of internal carotid arteries at skull base Skull: Intact Sinuses/Orbits: Clear Other: N/A IMPRESSION: Atrophy with small vessel chronic ischemic changes of deep cerebral white matter. Low-attenuation focus at posterior aspect of LEFT basal ganglia and LEFT external capsule consistent with infarct, favor subacute to old though new since 2014. No definite acute intracranial abnormalities. Electronically Signed   By: Lavonia Dana M.D.   On: 06/11/2020 14:31    Procedures Procedures   Medications Ordered in ED Medications  iohexol (OMNIPAQUE) 350 MG/ML injection 100 mL (100 mLs Intravenous Contrast Given 06/11/20 1342)    ED Course  I have reviewed the triage vital signs and the nursing notes.  Pertinent labs & imaging results that were available during my care of the patient were reviewed by me and considered in my medical decision making (see chart for details).    MDM Rules/Calculators/A&P                         CT angio shows some acute appearing occlusion of the right popliteal.  Dr. Rogene Houston to contact vascular surgery and arrange disposition  Final Clinical Impression(s) / ED Diagnoses Final diagnoses:  None    Rx /  DC Orders ED Discharge Orders    None        Milton Ferguson, MD 06/13/20 1008

## 2020-06-11 NOTE — ED Triage Notes (Signed)
Hit right leg with propane tank on last Thursday and has not been able to walk since then.  No bruising noted to leg.

## 2020-06-11 NOTE — ED Notes (Signed)
Unable to feel pulse in right foot.  Patient and wife are going to the ER at [facility].  Katie at [facility] ED notified.

## 2020-06-11 NOTE — Consult Note (Signed)
Vascular and Vein Specialist    Patient name: Thomas Mosley MRN: [id number] DOB: 02-17-1946 Sex: male    HPI: Thomas Mosley is a 75 y.o. male transferred from [facility] to [place] for evaluation of lower extremity arterial insufficiency.  Some of his history is provided by the patient but this is mainly supplemented by telephone conversation with his wife.  He is in frail health.  His wife reports that he does very little walking.  He has severe COPD.  History of coronary artery disease. She reports that over the last several days he has had some difficulty with clumsiness in his right foot.  She reports that he said to her 2 nights ago "maybe have had a stroke".  They presented to urgent care this morning for evaluation of this.  He was noted to have some cyanotic discoloration in his right foot greater than his left.  He was then transferred to [facility] emergency department.  She reports that she does not typically see his feet.  She does report that on physical exam a year ago the provider commented on the cyanotic changes in his feet at that time.  He does not have any claudication symptoms but she reports that he does very little walking.  No history of tissue loss on his lower extremities.  No prior history of stroke. Past Medical History:  Diagnosis Date  . COPD    not on home o2  . Coronary artery disease    NSTEMI 6/17 >> LHC: pLAD 50, pLCx 100 (L-L collats), oRCA 90, RPLB1 60 >> PCI:  atherectomy and DES to RCA // Echo 6/17: EF 55-60, Gr 1 DD, mild AI, mild MR  . Hypercholesterolemia   . Hypertension   . 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 Tobacco dependence     Family History  Problem Relation Age of Onset  . Heart attack Mother   .  Stroke Father   . Heart attack Brother   . Heart attack Brother 59  . Anesthesia problems Neg Hx   . Hypotension Neg Hx   . Malignant hyperthermia Neg Hx   . Pseudochol deficiency Neg Hx   . Colon cancer Neg Hx     SOCIAL HISTORY: Social History   Tobacco Use  . Smoking status: Current Every Day Smoker    Packs/day: 0.75    Years: 40.00    Pack years: 30.00    Types: Cigarettes  . Smokeless tobacco: Never Used  Substance Use Topics  . Alcohol use: Yes    Alcohol/week: 1.0 standard drink    Types: 1 Standard drinks or equivalent per week    Comment: glass of wine about 3 days a week     No Known Allergies  Current Facility-Administered Medications  Medication Dose Route Frequency Provider Last Rate Last Admin  .  stroke: mapping our Tarita Deshmukh stages of recovery book   Does not apply Once Karmen Bongo, MD      . 0.9 %  sodium chloride infusion   Intravenous Continuous Karmen Bongo, MD 50 mL/hr at 06/11/20 1929 Rate Change at 06/11/20 1929  . acetaminophen (TYLENOL) tablet 650 mg  650 mg Oral Q4H PRN Karmen Bongo, MD       Or  .  acetaminophen (TYLENOL) 160 MG/5ML solution 650 mg  650 mg Per Tube Q4H PRN Karmen Bongo, MD       Or  . acetaminophen (TYLENOL) suppository 650 mg  650 mg Rectal Q4H PRN Karmen Bongo, MD      . aspirin EC tablet 81 mg  81 mg Oral Daily Karmen Bongo, MD      . Derrill Memo ON 06/12/2020] atorvastatin (LIPITOR) tablet 80 mg  80 mg Oral Daily Karmen Bongo, MD      . Derrill Memo ON 06/12/2020] enoxaparin (LOVENOX) injection 40 mg  40 mg Subcutaneous Q24H Karmen Bongo, MD      . nicotine (NICODERM CQ - dosed in mg/24 hours) patch 14 mg  14 mg Transdermal Once Fredia Sorrow, MD   14 mg at 06/11/20 1637  . [START ON 06/12/2020] nicotine (NICODERM CQ - dosed in mg/24 hours) patch 21 mg  21 mg Transdermal Daily Karmen Bongo, MD      . Derrill Memo ON 06/12/2020] pantoprazole (PROTONIX) EC tablet 40 mg  40 mg Oral Daily Karmen Bongo, MD      .  senna-docusate (Senokot-S) tablet 1 tablet  1 tablet Oral QHS PRN Karmen Bongo, MD      . Derrill Memo ON 06/12/2020] umeclidinium-vilanterol (ANORO ELLIPTA) 62.5-25 MCG/INH 1 puff  1 puff Inhalation Daily Karmen Bongo, MD        REVIEW OF SYSTEMS:  Reviewed and as history and physical with nothing to add PHYSICAL EXAM: Vitals:   06/11/20 1715 06/11/20 1900 06/11/20 1930 06/11/20 2102  BP:  134/78 122/78 140/87  Pulse:  91 92 92  Resp:  20 19 19   Temp:    97.7 F (36.5 C)  TempSrc:    Oral  SpO2: 99% 96% 94% 98%  Weight:   69 kg 69 kg  Height:   6\' 2"  (1.88 m) 6\' 2"  (1.88 m)    GENERAL: The patient is a well-nourished male, in no acute distress. The vital signs are documented above. CARDIOVASCULAR: 2+ radial pulses bilaterally.  2+ femoral pulses bilaterally.  He has an easily palpable 2-3+ popliteal pulse on the right.  He does not have a left popliteal pulse.  He has absent distal pulses bilaterally.  He has good Doppler flow in the dorsalis pedis and posterior tibial bilaterally.  His feet are warm and not cyanotic and equal bilaterally.  He has no tenderness in his calves bilaterally. PULMONARY: There is good air exchange  MUSCULOSKELETAL: There are no major deformities or cyanosis. NEUROLOGIC: No focal weakness or paresthesias are detected. SKIN: There are no ulcers or rashes noted. PSYCHIATRIC: The patient has a normal affect.  DATA:  CT angiogram from [facility] was reviewed.  This reveals occlusion of his right popliteal artery with poor opacification of tibial vessels.  On the left leg he has complete occlusion of his superficial femoral artery at its origin in the groin with reconstitution of the popliteal artery behind the knee with good runoff through the tibial vessels.  Noncontrast CT of the head revealed stable left basal ganglia and left external capsule infarct  MRI revealed 2.3 cm acute to Juel Ripley subacute ischemic nonhemorrhagic infarct involving the posterior  left lentiform nucleus/corona radiata.       MEDICAL ISSUES: No evidence of acute critical limb ischemia.  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  He has a new difficulty of clumsiness in his right leg which is what was the presenting symptom.  Has a new  left brain stroke.  No need for emergent revascularization tonight with possible right popliteal pulse and good Doppler flow into his right foot.  I discussed this at length with the patient and his wife by telephone.  Recommend neurology evaluation in the morning.  Suspect most likely etiology for his new clumsiness is his acute stroke and not any acute ischemia in his right foot.  Explained to the patient and his wife that surgical treatment would involve exploration of the below-knee popliteal artery if this was felt to be acute and symptomatic.  No need to keep the patient n.p.o. and I do not feel there is any indication for anticoagulation currently    Rosetta Posner, MD FACS [facility] [place] Office Tel [phone]  Note: Portions of this report may have been transcribed using voice recognition software.  Every effort has been made to ensure accuracy; however, inadvertent computerized transcription errors may still be present.

## 2020-06-11 NOTE — ED Provider Notes (Signed)
Discussed with Dr. Donnetta Hutching vascular surgery.  Recommending internal medicine hospitalist admission to [facility] and they will consult vascular wise.  Wants him n.p.o.  For right leg ischemia.  She also states that the superficial femoral on the left side may not be all that chronic.  I will go ahead and do the rapid Covid testing on him.  Patient doesn't have any symptoms.  Has had both vaccines but did not have the booster.  We'll go ahead and get MRI brain since CT head is raising some question about chronic versus subacute infarct.  We'll contact hospitalist here to have him admitted to the hospitalist service at [facility].   Fredia Sorrow, MD 06/11/20 1616

## 2020-06-11 NOTE — ED Notes (Signed)
[facility] aware of bed assignment and will send a truck.

## 2020-06-11 NOTE — ED Triage Notes (Signed)
States his leg does not hurt. States he cannot walk on that leg., states he can hardly move his toes on that foot.  Slight blue tint to toes.  Foot cool to touch.

## 2020-06-11 NOTE — H&P (Addendum)
History and Physical    Thomas Mosley PNT:[id number] DOB: [birth year] DOA: 06/11/2020  PCP: Celene Squibb, MD Consultants:  Burt Knack - cardiology Patient coming from:  Home - lives with wife; NOK: Hansel Feinstein Jarrel Knoke,  [phone], [phone]   Chief Complaint: R leg injury  HPI: Thomas Mosley is a 75 y.o. male with medical history significant of HTN; HLD; CAD; and COPD with persistent tobacco dependence presenting with critical limb ischemia of the R leg after injury.  He hasn't been able to walk since last Thursday.  He was changing his propane tank and it hit against his leg.  The next day his was so sore he couldn't walk.  He refused to be seen.  Then he started to complain that his foot was numb.  His toes were purple and his wife took him to UC.  She couldn't find a pulse and they sent him to the ER.  He does not have known dementia.  No concerns for CVA - no dysphagia, dysarthria, N/W/T other than as above.  He is currently very unhappy that we won't allow him to smoke.    ED Course:  Acute occlusion of R popliteal area.  Dr. Donnetta Hutching asks for transfer to [facility].  Also with head CT, has underlying dementia, concern for ?CVA.  MRI here pending.  NPO.  COVID pending.  Review of Systems: As per HPI; otherwise review of systems reviewed and negative.   Ambulatory Status:  Ambulates without assistance, has needed a cane for the last few days  COVID Vaccine Status:  Complete  Past Medical History:  Diagnosis Date  . COPD    not on home o2  . Coronary artery disease    NSTEMI 6/17 >> LHC: pLAD 50, pLCx 100 (L-L collats), oRCA 90, RPLB1 60 >> PCI:  atherectomy and DES to RCA // Echo 6/17: EF 55-60, Gr 1 DD, mild AI, mild MR  . Hypercholesterolemia   . Hypertension   . 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 Tobacco  dependence     Past Surgical History:  Procedure Laterality Date  . CARDIAC CATHETERIZATION N/A 10/02/2015   Procedure: Left Heart Cath and Coronary Angiography;  Surgeon: Sherren Mocha, MD;  Location: [place] [facility];  Service: Cardiovascular;  Laterality: N/A;  . CARDIAC CATHETERIZATION N/A 10/02/2015   Procedure: Intravascular Ultrasound/IVUS;  Surgeon: Sherren Mocha, MD;  Location: [place] [facility];  Service: Cardiovascular;  Laterality: N/A;  . CARDIAC CATHETERIZATION N/A 10/03/2015   Procedure: Coronary Stent Intervention Rotablator;  Surgeon: Sherren Mocha, MD;  Location: [place] [facility];  Service: Cardiovascular;  Laterality: N/A;  . CATARACT EXTRACTION W/PHACO  02/02/2011   Procedure: CATARACT EXTRACTION PHACO AND INTRAOCULAR LENS PLACEMENT (IOC);  Surgeon: Tonny Branch;  Location: [facility];  Service: Ophthalmology;  Laterality: Left;  CDE 12.33  . COLONOSCOPY  02/13/2004   [id number] polyp at 30 cm resected/normal rectum. Path received from [facility] stating "microscopic focus of intramucosal carcinoma in situ arising in tubular adenoma, margin clear.   . COLONOSCOPY N/A 11/23/2012   Procedure: COLONOSCOPY;  Surgeon: Daneil Dolin, MD;  Location: [facility];  Service: Endoscopy;  Laterality: N/A;  12:15-rescheduled to 11:00am Darius Bump notified pt    Social History   Socioeconomic History  . Marital  status: Married    Spouse name: Not on file  . Number of children: Not on file  . Years of education: Not on file  . Highest education level: Not on file  Occupational History  . Not on file  Tobacco Use  . Smoking status: Current Every Day Smoker    Packs/day: 0.75    Years: 40.00    Pack years: 30.00    Types: Cigarettes  . Smokeless tobacco: Never Used  Vaping Use  . Vaping Use: Never used  Substance and Sexual Activity  . Alcohol use: Yes    Alcohol/week: 1.0 standard drink    Types: 1 Standard drinks or equivalent per week    Comment: glass of wine about 3  days a week   . Drug use: No  . Sexual activity: Yes  Other Topics Concern  . Not on file  Social History Narrative  . Not on file   Social Determinants of Health   Financial Resource Strain: Not on file  Food Insecurity: Not on file  Transportation Needs: Not on file  Physical Activity: Not on file  Stress: Not on file  Social Connections: Not on file  Intimate Partner Violence: Not on file    No Known Allergies  Family History  Problem Relation Age of Onset  . Heart attack Mother   . Stroke Father   . Heart attack Brother   . Heart attack Brother 34  . Anesthesia problems Neg Hx   . Hypotension Neg Hx   . Malignant hyperthermia Neg Hx   . Pseudochol deficiency Neg Hx   . Colon cancer Neg Hx     Prior to Admission medications   Medication Sig Start Date End Date Taking? Authorizing Provider  aspirin 81 MG EC tablet Take 1 tablet (81 mg total) by mouth daily. 01/16/20   Richardson Dopp T, PA-C  atorvastatin (LIPITOR) 80 MG tablet TAKE 1 TABLET BY MOUTH EVERY DAY 01/22/20   Sherren Mocha, MD  hydrochlorothiazide (HYDRODIURIL) 25 MG tablet Take 0.5 tablets (12.5 mg total) by mouth daily. 01/16/20   Richardson Dopp T, PA-C  losartan (COZAAR) 100 MG tablet Take 100 mg by mouth daily. 11/26/19   [provider]  metoprolol succinate (TOPROL-XL) 100 MG 24 hr tablet TAKE 1 TABLET (100 MG TOTAL) BY MOUTH DAILY. TAKE WITH OR IMMEDIATELY FOLLOWING A MEAL. 11/15/19   Sherren Mocha, MD  nitroGLYCERIN (NITROSTAT) 0.4 MG SL tablet Place 1 tablet (0.4 mg total) under the tongue every 5 (five) minutes x 3 doses as needed for chest pain. 10/04/15   Cheryln Manly, NP  pantoprazole (PROTONIX) 40 MG tablet Take 40 mg by mouth daily. 09/30/15   [provider]  umeclidinium-vilanterol (ANORO ELLIPTA) 62.5-25 MCG/INH AEPB Inhale 1 puff into the lungs daily. 06/11/19   Emerson Monte, FNP    Physical Exam: Vitals:   06/11/20 1600 06/11/20 1630 06/11/20 1700 06/11/20 1715   BP: (!) 146/89 (!) 151/91 (!) 140/95   Pulse: 93 (!) 101 (!) 101   Resp: (!) 23 20 (!) 21   Temp:      TempSrc:      SpO2: 100% 97%  99%  Weight:      Height:         . General:  Appears calm and comfortable and is in NAD . Eyes:   EOMI, normal lids, iris . ENT:   Hard of hearing, grossly normal lips & tongue, mmm . Neck:  no LAD, masses or  thyromegaly . Cardiovascular:  RRR, no m/r/g. No LE edema.  Marland Kitchen Respiratory:   CTA bilaterally with no wheezes/rales/rhonchi.  Mildly increased respiratory effort. . Abdomen:  soft, NT, ND . Skin:  no rash or induration seen on limited exam; both feet are currently cool to the touch with the R foot mildly TTP, neither is particularly blue  . Musculoskeletal:  grossly normal tone BUE/BLE, good ROM, no bony abnormality . Lower extremity:  No LE edema.  Limited foot exam with no ulcerations but R foot with 2 plantar calluses.  Nonpalpable distal pulses but dopplerable TP pulses on both feet. Marland Kitchen Psychiatric:  blunted mood and affect, speech dysarthric although possibly baseline since wife does not appear to notice a change . Neurologic:  CN 2-12 grossly intact, moves all extremities in coordinated fashion    Radiological Exams on Admission: Independently reviewed - see discussion in A/P where applicable  DG Chest 2 View  Result Date: 06/11/2020 CLINICAL DATA:  Status post trauma to the right leg. EXAM: CHEST - 2 VIEW COMPARISON:  May 31, 2020 FINDINGS: The heart size and mediastinal contours are within normal limits. There is marked severity calcification of the aortic arch. Both lungs are clear. The visualized skeletal structures are unremarkable. IMPRESSION: No active cardiopulmonary disease. Electronically Signed   By: Virgina Norfolk M.D.   On: 06/11/2020 17:14   CT Head Wo Contrast  Result Date: 06/11/2020 CLINICAL DATA:  Possible stroke EXAM: CT HEAD WITHOUT CONTRAST TECHNIQUE: Contiguous axial images were obtained from the base of the  skull through the vertex without intravenous contrast. Sagittal and coronal MPR images reconstructed from axial data set. COMPARISON:  01/15/2013 FINDINGS: Brain: Generalized atrophy. Normal ventricular morphology. No midline shift or mass effect. Small vessel chronic ischemic changes of deep cerebral white matter. Low-attenuation focus at posterior aspect of LEFT basal ganglia and LEFT external capsule consistent with infarct, favor subacute to old. No intracranial hemorrhage, mass lesion, or definite evidence of acute infarction. No extra-axial fluid collections. Vascular: No hyperdense vessels. Atherosclerotic calcifications of internal carotid arteries at skull base Skull: Intact Sinuses/Orbits: Clear Other: N/A IMPRESSION: Atrophy with small vessel chronic ischemic changes of deep cerebral white matter. Low-attenuation focus at posterior aspect of LEFT basal ganglia and LEFT external capsule consistent with infarct, favor subacute to old though new since 2014. No definite acute intracranial abnormalities. Electronically Signed   By: Lavonia Dana M.D.   On: 06/11/2020 14:31   MR ANGIO HEAD WO CONTRAST  Result Date: 06/11/2020 CLINICAL DATA:  Initial evaluation for acute stroke. EXAM: MRI HEAD WITHOUT CONTRAST MRA HEAD WITHOUT CONTRAST TECHNIQUE: Multiplanar, multiecho pulse sequences of the brain and surrounding structures were obtained without intravenous contrast. Angiographic images of the head were obtained using MRA technique without contrast. COMPARISON:  Prior CT from earlier the same day. FINDINGS: MRI HEAD FINDINGS Brain: Generalized age-related cerebral atrophy. Patchy and confluent T2/FLAIR hyperintensity within the periventricular and deep white matter both cerebral hemispheres, most consistent with chronic small vessel ischemic disease, mild to moderate in nature. Few scattered superimposed remote lacunar infarcts noted about the bilateral basal ganglia. Patchy involvement of the pons noted.  Approximate 2.3 cm focus of curvilinear restricted diffusion in extending from the posterior left lentiform nucleus towards the periventricular white matter of the posterior left corona radiata, consistent with an acute to early subacute ischemic infarct. No associated hemorrhage or mass effect. No other diffusion abnormality to suggest acute or subacute ischemia. Gray-white matter differentiation otherwise maintained. No encephalomalacia to suggest  chronic cortical infarction elsewhere within the brain. No other evidence for acute or chronic intracranial hemorrhage. No mass lesion, midline shift or mass effect. No hydrocephalus or extra-axial fluid collection. Pituitary gland suprasellar region within normal limits. Midline structures intact. Vascular: Major intracranial vascular flow voids are maintained. Skull and upper cervical spine: Craniocervical junction within normal limits. Bone marrow signal intensity within normal limits. No scalp soft tissue abnormality. Sinuses/Orbits: Patient status post bilateral ocular lens replacement. Globes and orbital soft tissues demonstrate no acute finding. Moderate chronic mucosal thickening noted throughout the paranasal sinuses. Few scattered superimposed retention cyst present within the maxillary sinuses bilaterally. Left-to-right nasal septal deviation with associated concha bullosa noted. Mastoid air cells are largely clear. Other: None. MRA HEAD FINDINGS ANTERIOR CIRCULATION: Examination degraded by motion artifact. Visualized distal cervical segments of the internal carotid arteries are patent with symmetric antegrade flow. Petrous segments widely patent. Multifocal atheromatous irregularity seen throughout the cavernous/supraclinoid ICAs without high-grade stenosis. 3 mm focal outpouching extending posteriorly from the proximal cavernous left ICA suspicious for a small aneurysm (series 10, image 93). An additional irregular and somewhat globular wide necked aneurysm  measuring approximately 9 x 6 mm seen extending superiorly from the cavernous left ICA as well (series 10, image 109). A1 segments patent bilaterally. Normal anterior communicating artery complex. ACAs patent to their distal aspects without appreciable stenosis. Markedly hypoplastic right ACA noted. No appreciable M1 stenosis or occlusion. Grossly normal MCA bifurcations. Distal MCA branches grossly perfused and symmetric. POSTERIOR CIRCULATION: Both V4 segments patent to the vertebrobasilar junction without stenosis. Left vertebral artery slightly dominant. Neither PICA origin well visualized. Basilar patent to its distal aspect without stenosis. Superior cerebellar arteries grossly patent at their origins. Both PCAs primarily supplied via the basilar and are well perfused to their distal aspects. IMPRESSION: MRI HEAD IMPRESSION: 1. 2.3 cm acute to early subacute ischemic nonhemorrhagic infarct involving the posterior left lentiform nucleus/corona radiata. 2. Underlying age-related cerebral atrophy with mild-to-moderate chronic microvascular ischemic disease. MRA HEAD IMPRESSION: 1. Technically limited exam due to motion artifact. 2. Negative intracranial MRA for large vessel occlusion. No hemodynamically significant or correctable stenosis identified. 3. 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 4. Additional 3 mm proximal cavernous left ICA aneurysm as above. Electronically Signed   By: Jeannine Boga M.D.   On: 06/11/2020 18:22   MR Brain Wo Contrast (neuro protocol)  Result Date: 06/11/2020 CLINICAL DATA:  Initial evaluation for acute stroke. EXAM: MRI HEAD WITHOUT CONTRAST MRA HEAD WITHOUT CONTRAST TECHNIQUE: Multiplanar, multiecho pulse sequences of the brain and surrounding structures were obtained without intravenous contrast. Angiographic images of the head were obtained using MRA technique without contrast. COMPARISON:  Prior CT from earlier the same day. FINDINGS: MRI HEAD FINDINGS Brain:  Generalized age-related cerebral atrophy. Patchy and confluent T2/FLAIR hyperintensity within the periventricular and deep white matter both cerebral hemispheres, most consistent with chronic small vessel ischemic disease, mild to moderate in nature. Few scattered superimposed remote lacunar infarcts noted about the bilateral basal ganglia. Patchy involvement of the pons noted. Approximate 2.3 cm focus of curvilinear restricted diffusion in extending from the posterior left lentiform nucleus towards the periventricular white matter of the posterior left corona radiata, consistent with an acute to early subacute ischemic infarct. No associated hemorrhage or mass effect. No other diffusion abnormality to suggest acute or subacute ischemia. Gray-white matter differentiation otherwise maintained. No encephalomalacia to suggest chronic cortical infarction elsewhere within the brain. No other evidence for acute or chronic intracranial hemorrhage.  No mass lesion, midline shift or mass effect. No hydrocephalus or extra-axial fluid collection. Pituitary gland suprasellar region within normal limits. Midline structures intact. Vascular: Major intracranial vascular flow voids are maintained. Skull and upper cervical spine: Craniocervical junction within normal limits. Bone marrow signal intensity within normal limits. No scalp soft tissue abnormality. Sinuses/Orbits: Patient status post bilateral ocular lens replacement. Globes and orbital soft tissues demonstrate no acute finding. Moderate chronic mucosal thickening noted throughout the paranasal sinuses. Few scattered superimposed retention cyst present within the maxillary sinuses bilaterally. Left-to-right nasal septal deviation with associated concha bullosa noted. Mastoid air cells are largely clear. Other: None. MRA HEAD FINDINGS ANTERIOR CIRCULATION: Examination degraded by motion artifact. Visualized distal cervical segments of the internal carotid arteries are patent  with symmetric antegrade flow. Petrous segments widely patent. Multifocal atheromatous irregularity seen throughout the cavernous/supraclinoid ICAs without high-grade stenosis. 3 mm focal outpouching extending posteriorly from the proximal cavernous left ICA suspicious for a small aneurysm (series 10, image 93). An additional irregular and somewhat globular wide necked aneurysm measuring approximately 9 x 6 mm seen extending superiorly from the cavernous left ICA as well (series 10, image 109). A1 segments patent bilaterally. Normal anterior communicating artery complex. ACAs patent to their distal aspects without appreciable stenosis. Markedly hypoplastic right ACA noted. No appreciable M1 stenosis or occlusion. Grossly normal MCA bifurcations. Distal MCA branches grossly perfused and symmetric. POSTERIOR CIRCULATION: Both V4 segments patent to the vertebrobasilar junction without stenosis. Left vertebral artery slightly dominant. Neither PICA origin well visualized. Basilar patent to its distal aspect without stenosis. Superior cerebellar arteries grossly patent at their origins. Both PCAs primarily supplied via the basilar and are well perfused to their distal aspects. IMPRESSION: MRI HEAD IMPRESSION: 1. 2.3 cm acute to early subacute ischemic nonhemorrhagic infarct involving the posterior left lentiform nucleus/corona radiata. 2. Underlying age-related cerebral atrophy with mild-to-moderate chronic microvascular ischemic disease. MRA HEAD IMPRESSION: 1. Technically limited exam due to motion artifact. 2. Negative intracranial MRA for large vessel occlusion. No hemodynamically significant or correctable stenosis identified. 3. 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 4. Additional 3 mm proximal cavernous left ICA aneurysm as above. Electronically Signed   By: Jeannine Boga M.D.   On: 06/11/2020 18:22   CT ANGIO AO+BIFEM W & OR WO CONTRAST  Result Date: 06/11/2020 CLINICAL DATA:  75 year old male  with pulseless right lower extremity and discoloration of toes EXAM: CT ANGIOGRAPHY OF ABDOMINAL AORTA WITH ILIOFEMORAL RUNOFF TECHNIQUE: Multidetector CT imaging of the abdomen, pelvis and lower extremities was performed using the standard protocol during bolus administration of intravenous contrast. Multiplanar CT image reconstructions and MIPs were obtained to evaluate the vascular anatomy. CONTRAST:  126mL OMNIPAQUE IOHEXOL 350 MG/ML SOLN COMPARISON:  None. FINDINGS: VASCULAR Aorta: Normal caliber and patent throughout. Near circumferential atherosclerotic calcifications without significant stenosis. About the distal thoracic aorta, just proximal to the diaphragm is a penetrating atheromatous ulcer with a patent neck measuring approximately 1.6 cm. The ulceration projects laterally to the right approximately 3.5 cm. Celiac: Mild ostial stenosis secondary to atherosclerotic plaque. Otherwise patent. SMA: Mild ostial stenosis secondary to atherosclerotic plaque. Otherwise patent. Renals: At least mild ostial stenosis of the bilateral single renal arteries which are otherwise patent without evidence of aneurysm, dissection, vasculitis, or fibromuscular dysplasia. IMA: Patent without evidence of aneurysm, dissection, vasculitis or significant stenosis. RIGHT Lower Extremity Inflow: Patent common iliac artery with circumferential atherosclerotic calcifications. Patent internal iliac artery with focal fusiform aneurysmal change just proximal to the division  or bifurcation measuring up to 1.5 cm. The external iliac artery is patent with circumferential coarse atherosclerotic calcifications. Outflow: The common femoral artery and profundus are patent. The superficial femoral artery is patent proximally with coarse near circumferential atherosclerotic calcifications throughout, resulting in at least mild multifocal stenoses. There is gradual tapering off at the level of the popliteal artery which is occluded. Runoff:  Completely occluded. No evidence of inline flow to the right foot. LEFT Lower Extremity Inflow: Patent common iliac artery with coarse circumferential atherosclerotic calcifications. At least mild ostial stenosis of the internal iliac ostium with partially thrombosed proximal internal aneurysm measuring up to approximately 1.9 cm. The external iliac artery is patent with coarse circumferential atherosclerotic calcifications. Outflow: The common femoral artery is patent with coarse atherosclerotic calcifications. The profundal is patent. There is chronic total occlusion of the proximal superficial femoral artery with distal reconstitution at Hunter's canal. At least moderate multifocal stenoses of the P1 segment of the popliteal artery secondary to coarse atherosclerotic plaque. Runoff: There is a high branching anterior tibial artery origin which arises from the distal P1 segment of the popliteal artery. The anterior tibial artery is patent proximally but tapers distally. The tibioperoneal trunk is patent proximally is coarse atherosclerotic plaques distally, extending into the proximal peroneal and posterior tibial arteries which appear patent but diminutive. The peroneal artery at the distal interosseous coarse. Inline flow to the foot is primarily by the posterior tibial artery. Veins: No obvious venous abnormality within the limitations of this arterial phase study. Review of the MIP images confirms the above findings. NON-VASCULAR Lower chest: Right basilar subsegmental atelectasis. Moderate centrilobular emphysema. The heart is normal in size. No pericardial effusion. Hepatobiliary: Liver is normal in size, contour, and attenuation. There are multifocal subcentimeter hypodensities, too small to characterize by CT, though likely representing simple hepatic cysts. The gallbladder is present unremarkable. No intra or extrahepatic biliary ductal dilation. Pancreas: Unremarkable. No pancreatic ductal dilatation or  surrounding inflammatory changes. Spleen: Normal in size without focal abnormality. Adrenals/Urinary Tract: Adrenal glands are unremarkable. Right upper pole partially exophytic simple renal cyst measuring up to 1.5 cm. Kidneys are otherwise normal, without renal calculi, focal lesion, or hydronephrosis. Bladder is unremarkable. Stomach/Bowel: Stomach is within normal limits. Appendix appears normal. No evidence of bowel wall thickening, distention, or inflammatory changes. Lymphatic: No abdominopelvic lymphadenopathy. Reproductive: Prostate is unremarkable. Other: No abdominal wall hernia or abnormality. No abdominopelvic ascites. Musculoskeletal: No acute or significant osseous findings. IMPRESSION: VASCULAR 1. Acute appearing occlusion of the right popliteal artery extending into the runoff vessels, likely secondary to ruptured atherosclerotic plaque. No evidence of inline flow to the right foot. 2. 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 3. 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 4. 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 NON-VASCULAR No acute abdominopelvic abnormality. Recommend emergent Vascular Surgery consultation. Ruthann Cancer, MD [facility] Electronically Signed   By: Ruthann Cancer MD   On: 06/11/2020 15:21    EKG: Independently reviewed.  NSR with rate 96; no evidence of acute ischemia   Labs on Admission: I have personally reviewed the available labs and imaging studies at the time of the admission.  Pertinent labs:   Glucose 63 Normal CBC   Assessment/Plan Principal Problem:  Critical lower limb ischemia (HCC) Active Problems:   Essential hypertension   Tobacco  dependence   Hypercholesterolemia   Coronary artery disease   COPD   Acute CVA (cerebrovascular accident) ([place])   Critical limb ischemia -Patient with propane tank to lower leg injury on the R late last week and difficulty ambulating since  -Seen at [facility] today with R foot blue and concern for pulseless, sent to ER for further evaluation -At the time of my evaluation, both feet are cool and pale but neither is frankly blue; R foot is mildly uncomfortable for the patient -CTA of the LE with acute appearing occlusion of the R popliteal artery into the runoff vessels, concerning for ruptured atherosclerotic plaque; no evidence of inline flow to the R foot.   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 Donnetta Hutching, who requests transport to [facility] ASAP -A bed on 4E has been assigned and so ER:ER transport does not appear to be needed at this time -[facility] is aware of the urgent need for transfer -Patient will remain NPO pending possible need for surgery tonight -No heparin given acute CVA (see below) -Will give ASA 81 mg now  CVA -Incidental finding of possible CVA on head CT -Wife denies h/o dementia, speech/swallow dysfunction, and attributes gait dysfunction to his incident as above -On exam, the patient is quite hard of hearing but his speech is also dysarthric (to me, maybe not to his wife?) -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 -Risk stratification with FLP, A1c; will also check TSH and UDS -Neurology consult -PT/OT/ST/Nutrition Consults  HTN -Allow permissive HTN for now -Treat BP only if >220/120, and then with goal of 15% reduction   HLD -Check FLP -Continue Lipitor 80 mg daily   CAD -NSTEMI in 2017 with DES placement -Denies current chest pain   COPD with ongoing tobacco dependence -Not on home O2 -Continue Anoro -Reports only smoking a few cigs/day but patient is very upset about not being able to smoke currently and this appears to indicate a heavier dependence -Encourage cessation.   -Patch ordered, 21 mg/day    Note: This patient has been tested and is pending for the novel coronavirus COVID-19. He has been fully vaccinated against COVID-19.    DVT prophylaxis:  Lovenox  Code Status: Full - confirmed with patient/family Family Communication: Wife present throughout evaluation Disposition Plan:  The patient is from: home  Anticipated d/c is to: be determined  Anticipated d/c date will depend on clinical response to treatment, likely several days from now.    Patient is currently: acutely ill, transferring  to [facility] Consults called: Neurology; PT/OT/ST/Nutrition Admission status: Admit - It is my clinical opinion that admission to INPATIENT is reasonable and necessary because of the expectation that this patient will require hospital care that crosses at least 2 midnights to treat this condition based on the medical complexity of the problems presented.  Given the aforementioned  information, the predictability of an adverse outcome is felt to be significant.    Karmen Bongo MD [facility]   How to contact the [facility] Attending or Consulting provider [place] or covering provider during after hours [place], for this patient?  1. Check the care team in [facility] and look for a) attending/consulting TR[facility] provider listed and b) the [facility] team listed 2. Log into [email or web address] and use [place]'s universal password to access. If you do not have the password, please contact the hospital operator. 3. Locate the [facility] provider you are looking for under [facility] and page to a number that you can be directly reached. 4. If you still have difficulty reaching the provider, please page the [facility] (Director on Call) for the Hospitalists listed on amion for assistance.   06/11/2020, 7:07 PM

## 2020-06-12 ENCOUNTER — Inpatient Hospital Stay (HOSPITAL_BASED_OUTPATIENT_CLINIC_OR_DEPARTMENT_OTHER): Payer: Medicare HMO

## 2020-06-12 DIAGNOSIS — I6389 Other cerebral infarction: Secondary | ICD-10-CM | POA: Diagnosis not present

## 2020-06-12 DIAGNOSIS — I639 Cerebral infarction, unspecified: Secondary | ICD-10-CM | POA: Diagnosis not present

## 2020-06-12 DIAGNOSIS — J449 Chronic obstructive pulmonary disease, unspecified: Secondary | ICD-10-CM | POA: Diagnosis not present

## 2020-06-12 DIAGNOSIS — I70229 Atherosclerosis of native arteries of extremities with rest pain, unspecified extremity: Secondary | ICD-10-CM | POA: Diagnosis not present

## 2020-06-12 DIAGNOSIS — I739 Peripheral vascular disease, unspecified: Secondary | ICD-10-CM | POA: Diagnosis not present

## 2020-06-12 LAB — RAPID URINE DRUG SCREEN, HOSP PERFORMED
Amphetamines: NOT DETECTED
Barbiturates: NOT DETECTED
Benzodiazepines: POSITIVE — AB
Cocaine: NOT DETECTED
Opiates: NOT DETECTED
Tetrahydrocannabinol: NOT DETECTED

## 2020-06-12 LAB — LIPID PANEL
Cholesterol: 101 mg/dL (ref 0–200)
HDL: 34 mg/dL — ABNORMAL LOW (ref >40)
LDL Cholesterol: 58 mg/dL (ref 0–99)
Total CHOL/HDL Ratio: 3 RATIO
Triglycerides: 47 mg/dL (ref <150)
VLDL: 9 mg/dL (ref 0–40)

## 2020-06-12 LAB — ECHOCARDIOGRAM COMPLETE
AR max vel: 2.15 cm2
AV Area VTI: 2.34 cm2
AV Area mean vel: 2.13 cm2
AV Mean grad: 9 mmHg
AV Peak grad: 14.9 mmHg
Ao pk vel: 1.93 m/s
Height: 74 in
P 1/2 time: 459 msec
S' Lateral: 3 cm
Weight: 2433.88 oz

## 2020-06-12 LAB — HEMOGLOBIN A1C
Hgb A1c MFr Bld: 5.4 % (ref 4.8–5.6)
Mean Plasma Glucose: 108.28 mg/dL

## 2020-06-12 LAB — TSH: TSH: 2.842 u[IU]/mL (ref 0.350–4.500)

## 2020-06-12 MED ORDER — CLOPIDOGREL BISULFATE 75 MG PO TABS: 75.0000 mg | ORAL_TABLET | Freq: Every day | ORAL | 0 refills | Status: DC

## 2020-06-12 NOTE — Progress Notes (Signed)
TOC CM received call for a possible Code 44. [place], [place] [facility] TOC CM [phone]

## 2020-06-12 NOTE — Care Management CC44 (Signed)
Condition Code 44 Documentation Completed  Patient Details  Name: Thomas Mosley MRN: [id number] Date of Birth: 1945/07/03   Condition Code 44 given:  Yes Patient signature on Condition Code 44 notice:  Yes Documentation of 2 MD's agreement:  Yes Code 44 added to claim:  Yes    Erenest Rasher, RN 06/12/2020, 6:43 PM

## 2020-06-12 NOTE — Progress Notes (Signed)
Medicare Outpatient Observation Notice  Patient name:  Thomas Mosley Patient number:  [id number]                                                                                                                                                                       You're a hospital outpatient receiving observation services. You are not an inpatient because:   you did not meet Medicare guidelines for Inpatient. Medicare requires 2 midnight hospital stay.  Patient status is changed from inpatient to observation (outpatient)status as indicated under the Medicare Condition Code-44 Regulations, Chapter 100-04 of the Medicare Claims Processing Manual 50.3. Date of Status Change 06/12/2020 6:38 PM                                                                                                                                                                       Being an outpatient may affect what you pay in a hospital:   When you're a hospital outpatient, your observation stay is covered under Medicare Part B.   For Part B services, you generally pay:  ? A copayment for each outpatient hospital service you get. Part B copayments may vary by type of service.  ? 20% of the Medicare-approved amount for most doctor services, after the Part B deductible.  Observation services may affect coverage and payment of your care after you leave the hospital:     If you need skilled nursing facility (SNF) care after you leave the hospital, Medicare Part A will only cover SNF care if you've had a 3-day minimum, medically necessary, inpatient hospital stay for a related illness or injury. An inpatient hospital stay begins the day the hospital admits you as an inpatient based on a doctor's order and doesn't include the day you're discharged.   If you have Medicaid, a Medicare Advantage plan or other health plan, Medicaid or the plan may have different rules for SNF coverage after you leave the  hospital. Check with Medicaid  or your plan.  NOTE: Medicare Part A generally doesn't cover outpatient hospital services, like an observation stay. However, Part A will generally cover medically necessary inpatient services if the hospital admits you as an inpatient based on a doctor's order. In most cases, you'll pay a one-time deductible for all of your inpatient hospital services for the first 60 days you're in a hospital.                                                                                                                                                                      If you have any questions about your observation services, ask the hospital staff member giving you this notice or the doctor providing your hospital care. You can also ask to speak with someone from the hospital's utilization or discharge planning department.  You can also call 1-800-MEDICARE (1-548-146-4781).  TT[employer] users should call [phone].  Form CMS 32440-[id number]  Expiration 04/12/2021 [employer] APPROVAL [id number]       Your costs for medications:    Generally, prescription and over-the-counter drugs, including "self-administered drugs," you get in a hospital outpatient setting (like an emergency department) aren't covered by Part B. "Self- administered drugs" are drugs you'd normally take on your own. For safety reasons, many hospitals don't allow you to take medications brought from home. If you have a Medicare prescription drug plan (Part D), your plan may help you pay for these drugs. You'll likely need to pay out-of- pocket for these drugs and submit a claim to your drug plan for a refund. Contact your drug plan for more information.                                                                                                                                                                       If you're enrolled in a Medicare Advantage plan (like an HMO or PPO) or other Medicare health plan  (Part C), your costs and coverage may be different. Check with your  plan to find out about coverage for outpatient observation services.   If you're a Qualified Medicare Beneficiary through your state Medicaid program, you can't be billed for Part A or Part B deductibles, coinsurance, and copayments.                                                                                                                                                                      Additional Information (Optional):  TOC CM delivered Code 44 via phone to patient's wife, Mechel Schutter. Explained Code 76 and wife verbalized understanding. Will have RN provide wife with copy.                                                                                                                                                                          Please sign below to show you received and understand this notice.                                          Date: 06/12/20 / Time:6:37 PM  CMS does not discriminate in its programs and activities. To request this publication in alternative format, please call: 1-800-MEDICARE or email:[email or web address].  Form CMS [id number]  Expiration 04/12/2021 [employer] APPROVAL [id number]      Patient                 Connected Tablet              Trace   Slow   Corrupt  Edit Data   Change Template

## 2020-06-12 NOTE — Discharge Summary (Signed)
Physician Discharge Summary  Thomas Mosley GQQ:[id number] DOB: [birth year] DOA: 06/11/2020  PCP: Celene Squibb, MD  Admit date: 06/11/2020 Discharge date: 06/12/2020  Admitted From: Home Disposition:  Home  Recommendations for Outpatient Follow-up:  1. Follow up with pcp in 1 week 2. Follow up neurology in 2-4 weeks 3. Take aspirin & pelvix for 3 weeks and then aspirin alone. 4. Resume antihypertensive tomorrow 5. Follow up with Vascular surgery outpatient  Home Health: none  Equipment/Devices:none Discharge Condition:stable CODE STATUS: full code Diet recommendation: low sodium diet  Brief/Interim Summary: Patient is 75 year old male with past medical history of hypertension, hyperlipidemia, coronary artery disease, COPD with current tobacco use presents to emergency department with right leg pain.  ED course: CT aorta with bifemoral shows acute occlusion of right popliteal area.  Vascular surgery consulted, CT head concerning for acute CVA-neurology consulted.  Patient admitted for further evaluation and management.  Acute ischemic stroke: -Reviewed CT head and MRI brain-shows 2.3 cm acute to early subacute infarct of posterior left lentiform nucleus/corona radiata. -MRA shows: 75mm left ICA aneurysm, no LVO -Consulted neurology-recommend aspirin & Plavix for 3 weeks & then aspirin alone-okay to DC from neurology stand point-appreciate help. -Continued aspirin and statin.  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  No evidence of acute critical limb ischemia.  Patient has palpable dorsalis pedis and posterior tibial pulses on the right. -Vascular surgery consulted-recommend no need for urgent revascularization or anticoagulation at this time.  Recommend a neurology consult as his symptoms are likely related  to CVA-appreciate vascular surgery's recommendation. -Continued aspirin and statin  Hypertension: -Allow permissive hypertension.  Hold home BP meds-losartan, metoprolol, HCTZ at this time.  -Resumed home meds-recommend to start tomorrow.  Hyperlipidemia:  -Continued statin  Coronary artery disease in 2017 with DES placement -Patient denied ACS symptoms.  Continued aspirin, statin  COPD with current tobacco abuse: -Does not use oxygen at home. -Continued Anoro. -nicotine patch  GERD: Continued PPI  Discharge Diagnoses:  Acute ischemic stroke Right popliteal artery and left SFA occlusion Hypertension Hyperlipidemia CAD COPD with current tobacco abuse GERD  Discharge Instructions  Discharge Instructions    Diet - low sodium heart healthy   Complete by: As directed    Discharge instructions   Complete by: As directed    Follow up with pcp in 1 week Follow up neurology in 2-4 weeks Take aspirin & pelvix for 3 weeks and then aspirin alone.   Increase activity slowly   Complete by: As directed      Allergies as of 06/12/2020   No Known Allergies     Medication List    TAKE these medications   albuterol 108 (90 Base) MCG/ACT inhaler Commonly known as: VENTOLIN HFA Inhale 2 puffs into the lungs every 6 (six) hours as needed.   aspirin 81 MG EC tablet Take 1 tablet (81 mg total) by mouth daily.   atorvastatin 80 MG tablet Commonly known as: LIPITOR TAKE 1 TABLET BY MOUTH EVERY DAY   clopidogrel 75 MG tablet Commonly known as: Plavix Take 1 tablet (75 mg total) by mouth daily for 21 days.   hydrochlorothiazide 25 MG tablet Commonly known as: HYDRODIURIL Take 0.5 tablets (12.5 mg total) by mouth daily.   loratadine 10 MG tablet Commonly known as: CLARITIN Take 10 mg by mouth daily.  losartan 100 MG tablet Commonly known as: COZAAR Take 100 mg by mouth daily.   metoprolol succinate 100 MG 24 hr tablet Commonly known as: TOPROL-XL TAKE 1 TABLET  (100 MG TOTAL) BY MOUTH DAILY. TAKE WITH OR IMMEDIATELY FOLLOWING A MEAL.   nitroGLYCERIN 0.4 MG SL tablet Commonly known as: NITROSTAT Place 1 tablet (0.4 mg total) under the tongue every 5 (five) minutes x 3 doses as needed for chest pain.   pantoprazole 40 MG tablet Commonly known as: PROTONIX Take 40 mg by mouth daily.   umeclidinium-vilanterol 62.5-25 MCG/INH Aepb Commonly known as: ANORO ELLIPTA Inhale 1 puff into the lungs daily.       No Known Allergies  Consultations:  Neurology  Vascular surgery   Procedures/Studies: DG Chest 2 View  Result Date: 06/11/2020 CLINICAL DATA:  Status post trauma to the right leg. EXAM: CHEST - 2 VIEW COMPARISON:  May 31, 2020 FINDINGS: The heart size and mediastinal contours are within normal limits. There is marked severity calcification of the aortic arch. Both lungs are clear. The visualized skeletal structures are unremarkable. IMPRESSION: No active cardiopulmonary disease. Electronically Signed   By: Virgina Norfolk M.D.   On: 06/11/2020 17:14   DG Chest 2 View  Result Date: 05/31/2020 CLINICAL DATA:  Pectus excavatum, smoker, cough, shortness of breath, COPD, coronary artery disease, hypertension, smoker EXAM: CHEST - 2 VIEW COMPARISON:  03/28/2018 FINDINGS: Normal heart size, mediastinal contours, and pulmonary vascularity. Atherosclerotic calcification aorta. Emphysematous and minimal bronchitic changes consistent with COPD. No acute infiltrate, pleural effusion, or pneumothorax. Coronary stent noted. No acute osseous findings. IMPRESSION: COPD changes without acute abnormalities. Aortic Atherosclerosis (ICD10-I70.0) Emphysema (ICD10-J43.9). Electronically Signed   By: Lavonia Dana M.D.   On: 05/31/2020 18:45   CT Head Wo Contrast  Result Date: 06/11/2020 CLINICAL DATA:  Possible stroke EXAM: CT HEAD WITHOUT CONTRAST TECHNIQUE: Contiguous axial images were obtained from the base of the skull through the vertex without  intravenous contrast. Sagittal and coronal MPR images reconstructed from axial data set. COMPARISON:  01/15/2013 FINDINGS: Brain: Generalized atrophy. Normal ventricular morphology. No midline shift or mass effect. Small vessel chronic ischemic changes of deep cerebral white matter. Low-attenuation focus at posterior aspect of LEFT basal ganglia and LEFT external capsule consistent with infarct, favor subacute to old. No intracranial hemorrhage, mass lesion, or definite evidence of acute infarction. No extra-axial fluid collections. Vascular: No hyperdense vessels. Atherosclerotic calcifications of internal carotid arteries at skull base Skull: Intact Sinuses/Orbits: Clear Other: N/A IMPRESSION: Atrophy with small vessel chronic ischemic changes of deep cerebral white matter. Low-attenuation focus at posterior aspect of LEFT basal ganglia and LEFT external capsule consistent with infarct, favor subacute to old though new since 2014. No definite acute intracranial abnormalities. Electronically Signed   By: Lavonia Dana M.D.   On: 06/11/2020 14:31   MR ANGIO HEAD WO CONTRAST  Result Date: 06/11/2020 CLINICAL DATA:  Initial evaluation for acute stroke. EXAM: MRI HEAD WITHOUT CONTRAST MRA HEAD WITHOUT CONTRAST TECHNIQUE: Multiplanar, multiecho pulse sequences of the brain and surrounding structures were obtained without intravenous contrast. Angiographic images of the head were obtained using MRA technique without contrast. COMPARISON:  Prior CT from earlier the same day. FINDINGS: MRI HEAD FINDINGS Brain: Generalized age-related cerebral atrophy. Patchy and confluent T2/FLAIR hyperintensity within the periventricular and deep white matter both cerebral hemispheres, most consistent with chronic small vessel ischemic disease, mild to moderate in nature. Few scattered superimposed remote lacunar infarcts noted about the bilateral basal ganglia. Patchy involvement  of the pons noted. Approximate 2.3 cm focus of  curvilinear restricted diffusion in extending from the posterior left lentiform nucleus towards the periventricular white matter of the posterior left corona radiata, consistent with an acute to early subacute ischemic infarct. No associated hemorrhage or mass effect. No other diffusion abnormality to suggest acute or subacute ischemia. Gray-white matter differentiation otherwise maintained. No encephalomalacia to suggest chronic cortical infarction elsewhere within the brain. No other evidence for acute or chronic intracranial hemorrhage. No mass lesion, midline shift or mass effect. No hydrocephalus or extra-axial fluid collection. Pituitary gland suprasellar region within normal limits. Midline structures intact. Vascular: Major intracranial vascular flow voids are maintained. Skull and upper cervical spine: Craniocervical junction within normal limits. Bone marrow signal intensity within normal limits. No scalp soft tissue abnormality. Sinuses/Orbits: Patient status post bilateral ocular lens replacement. Globes and orbital soft tissues demonstrate no acute finding. Moderate chronic mucosal thickening noted throughout the paranasal sinuses. Few scattered superimposed retention cyst present within the maxillary sinuses bilaterally. Left-to-right nasal septal deviation with associated concha bullosa noted. Mastoid air cells are largely clear. Other: None. MRA HEAD FINDINGS ANTERIOR CIRCULATION: Examination degraded by motion artifact. Visualized distal cervical segments of the internal carotid arteries are patent with symmetric antegrade flow. Petrous segments widely patent. Multifocal atheromatous irregularity seen throughout the cavernous/supraclinoid ICAs without high-grade stenosis. 3 mm focal outpouching extending posteriorly from the proximal cavernous left ICA suspicious for a small aneurysm (series 10, image 93). An additional irregular and somewhat globular wide necked aneurysm measuring approximately 9 x  6 mm seen extending superiorly from the cavernous left ICA as well (series 10, image 109). A1 segments patent bilaterally. Normal anterior communicating artery complex. ACAs patent to their distal aspects without appreciable stenosis. Markedly hypoplastic right ACA noted. No appreciable M1 stenosis or occlusion. Grossly normal MCA bifurcations. Distal MCA branches grossly perfused and symmetric. POSTERIOR CIRCULATION: Both V4 segments patent to the vertebrobasilar junction without stenosis. Left vertebral artery slightly dominant. Neither PICA origin well visualized. Basilar patent to its distal aspect without stenosis. Superior cerebellar arteries grossly patent at their origins. Both PCAs primarily supplied via the basilar and are well perfused to their distal aspects. IMPRESSION: MRI HEAD IMPRESSION: 1. 2.3 cm acute to early subacute ischemic nonhemorrhagic infarct involving the posterior left lentiform nucleus/corona radiata. 2. Underlying age-related cerebral atrophy with mild-to-moderate chronic microvascular ischemic disease. MRA HEAD IMPRESSION: 1. Technically limited exam due to motion artifact. 2. Negative intracranial MRA for large vessel occlusion. No hemodynamically significant or correctable stenosis identified. 3. 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 4. Additional 3 mm proximal cavernous left ICA aneurysm as above. Electronically Signed   By: Jeannine Boga M.D.   On: 06/11/2020 18:22   MR Brain Wo Contrast (neuro protocol)  Result Date: 06/11/2020 CLINICAL DATA:  Initial evaluation for acute stroke. EXAM: MRI HEAD WITHOUT CONTRAST MRA HEAD WITHOUT CONTRAST TECHNIQUE: Multiplanar, multiecho pulse sequences of the brain and surrounding structures were obtained without intravenous contrast. Angiographic images of the head were obtained using MRA technique without contrast. COMPARISON:  Prior CT from earlier the same day. FINDINGS: MRI HEAD FINDINGS Brain: Generalized age-related  cerebral atrophy. Patchy and confluent T2/FLAIR hyperintensity within the periventricular and deep white matter both cerebral hemispheres, most consistent with chronic small vessel ischemic disease, mild to moderate in nature. Few scattered superimposed remote lacunar infarcts noted about the bilateral basal ganglia. Patchy involvement of the pons noted. Approximate 2.3 cm focus of curvilinear restricted diffusion in extending from  the posterior left lentiform nucleus towards the periventricular white matter of the posterior left corona radiata, consistent with an acute to early subacute ischemic infarct. No associated hemorrhage or mass effect. No other diffusion abnormality to suggest acute or subacute ischemia. Gray-white matter differentiation otherwise maintained. No encephalomalacia to suggest chronic cortical infarction elsewhere within the brain. No other evidence for acute or chronic intracranial hemorrhage. No mass lesion, midline shift or mass effect. No hydrocephalus or extra-axial fluid collection. Pituitary gland suprasellar region within normal limits. Midline structures intact. Vascular: Major intracranial vascular flow voids are maintained. Skull and upper cervical spine: Craniocervical junction within normal limits. Bone marrow signal intensity within normal limits. No scalp soft tissue abnormality. Sinuses/Orbits: Patient status post bilateral ocular lens replacement. Globes and orbital soft tissues demonstrate no acute finding. Moderate chronic mucosal thickening noted throughout the paranasal sinuses. Few scattered superimposed retention cyst present within the maxillary sinuses bilaterally. Left-to-right nasal septal deviation with associated concha bullosa noted. Mastoid air cells are largely clear. Other: None. MRA HEAD FINDINGS ANTERIOR CIRCULATION: Examination degraded by motion artifact. Visualized distal cervical segments of the internal carotid arteries are patent with symmetric  antegrade flow. Petrous segments widely patent. Multifocal atheromatous irregularity seen throughout the cavernous/supraclinoid ICAs without high-grade stenosis. 3 mm focal outpouching extending posteriorly from the proximal cavernous left ICA suspicious for a small aneurysm (series 10, image 93). An additional irregular and somewhat globular wide necked aneurysm measuring approximately 9 x 6 mm seen extending superiorly from the cavernous left ICA as well (series 10, image 109). A1 segments patent bilaterally. Normal anterior communicating artery complex. ACAs patent to their distal aspects without appreciable stenosis. Markedly hypoplastic right ACA noted. No appreciable M1 stenosis or occlusion. Grossly normal MCA bifurcations. Distal MCA branches grossly perfused and symmetric. POSTERIOR CIRCULATION: Both V4 segments patent to the vertebrobasilar junction without stenosis. Left vertebral artery slightly dominant. Neither PICA origin well visualized. Basilar patent to its distal aspect without stenosis. Superior cerebellar arteries grossly patent at their origins. Both PCAs primarily supplied via the basilar and are well perfused to their distal aspects. IMPRESSION: MRI HEAD IMPRESSION: 1. 2.3 cm acute to early subacute ischemic nonhemorrhagic infarct involving the posterior left lentiform nucleus/corona radiata. 2. Underlying age-related cerebral atrophy with mild-to-moderate chronic microvascular ischemic disease. MRA HEAD IMPRESSION: 1. Technically limited exam due to motion artifact. 2. Negative intracranial MRA for large vessel occlusion. No hemodynamically significant or correctable stenosis identified. 3. 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 4. Additional 3 mm proximal cavernous left ICA aneurysm as above. Electronically Signed   By: Jeannine Boga M.D.   On: 06/11/2020 18:22   CT ANGIO AO+BIFEM W & OR WO CONTRAST  Result Date: 06/11/2020 CLINICAL DATA:  75 year old male with pulseless  right lower extremity and discoloration of toes EXAM: CT ANGIOGRAPHY OF ABDOMINAL AORTA WITH ILIOFEMORAL RUNOFF TECHNIQUE: Multidetector CT imaging of the abdomen, pelvis and lower extremities was performed using the standard protocol during bolus administration of intravenous contrast. Multiplanar CT image reconstructions and MIPs were obtained to evaluate the vascular anatomy. CONTRAST:  125mL OMNIPAQUE IOHEXOL 350 MG/ML SOLN COMPARISON:  None. FINDINGS: VASCULAR Aorta: Normal caliber and patent throughout. Near circumferential atherosclerotic calcifications without significant stenosis. About the distal thoracic aorta, just proximal to the diaphragm is a penetrating atheromatous ulcer with a patent neck measuring approximately 1.6 cm. The ulceration projects laterally to the right approximately 3.5 cm. Celiac: Mild ostial stenosis secondary to atherosclerotic plaque. Otherwise patent. SMA: Mild ostial stenosis secondary to  atherosclerotic plaque. Otherwise patent. Renals: At least mild ostial stenosis of the bilateral single renal arteries which are otherwise patent without evidence of aneurysm, dissection, vasculitis, or fibromuscular dysplasia. IMA: Patent without evidence of aneurysm, dissection, vasculitis or significant stenosis. RIGHT Lower Extremity Inflow: Patent common iliac artery with circumferential atherosclerotic calcifications. Patent internal iliac artery with focal fusiform aneurysmal change just proximal to the division or bifurcation measuring up to 1.5 cm. The external iliac artery is patent with circumferential coarse atherosclerotic calcifications. Outflow: The common femoral artery and profundus are patent. The superficial femoral artery is patent proximally with coarse near circumferential atherosclerotic calcifications throughout, resulting in at least mild multifocal stenoses. There is gradual tapering off at the level of the popliteal artery which is occluded. Runoff: Completely  occluded. No evidence of inline flow to the right foot. LEFT Lower Extremity Inflow: Patent common iliac artery with coarse circumferential atherosclerotic calcifications. At least mild ostial stenosis of the internal iliac ostium with partially thrombosed proximal internal aneurysm measuring up to approximately 1.9 cm. The external iliac artery is patent with coarse circumferential atherosclerotic calcifications. Outflow: The common femoral artery is patent with coarse atherosclerotic calcifications. The profundal is patent. There is chronic total occlusion of the proximal superficial femoral artery with distal reconstitution at Hunter's canal. At least moderate multifocal stenoses of the P1 segment of the popliteal artery secondary to coarse atherosclerotic plaque. Runoff: There is a high branching anterior tibial artery origin which arises from the distal P1 segment of the popliteal artery. The anterior tibial artery is patent proximally but tapers distally. The tibioperoneal trunk is patent proximally is coarse atherosclerotic plaques distally, extending into the proximal peroneal and posterior tibial arteries which appear patent but diminutive. The peroneal artery at the distal interosseous coarse. Inline flow to the foot is primarily by the posterior tibial artery. Veins: No obvious venous abnormality within the limitations of this arterial phase study. Review of the MIP images confirms the above findings. NON-VASCULAR Lower chest: Right basilar subsegmental atelectasis. Moderate centrilobular emphysema. The heart is normal in size. No pericardial effusion. Hepatobiliary: Liver is normal in size, contour, and attenuation. There are multifocal subcentimeter hypodensities, too small to characterize by CT, though likely representing simple hepatic cysts. The gallbladder is present unremarkable. No intra or extrahepatic biliary ductal dilation. Pancreas: Unremarkable. No pancreatic ductal dilatation or surrounding  inflammatory changes. Spleen: Normal in size without focal abnormality. Adrenals/Urinary Tract: Adrenal glands are unremarkable. Right upper pole partially exophytic simple renal cyst measuring up to 1.5 cm. Kidneys are otherwise normal, without renal calculi, focal lesion, or hydronephrosis. Bladder is unremarkable. Stomach/Bowel: Stomach is within normal limits. Appendix appears normal. No evidence of bowel wall thickening, distention, or inflammatory changes. Lymphatic: No abdominopelvic lymphadenopathy. Reproductive: Prostate is unremarkable. Other: No abdominal wall hernia or abnormality. No abdominopelvic ascites. Musculoskeletal: No acute or significant osseous findings. IMPRESSION: VASCULAR 1. Acute appearing occlusion of the right popliteal artery extending into the runoff vessels, likely secondary to ruptured atherosclerotic plaque. No evidence of inline flow to the right foot. 2. 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 3. 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 4. 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 NON-VASCULAR No acute abdominopelvic abnormality. Recommend emergent Vascular Surgery consultation. Ruthann Cancer, MD [facility]  Electronically Signed   By: Ruthann Cancer MD   On: 06/11/2020 15:21   ECHOCARDIOGRAM COMPLETE  Result Date: 06/12/2020    ECHOCARDIOGRAM REPORT   Patient Name:   Thomas Mosley Date of Exam: 06/12/2020 Medical Rec #:  [id number]      Height:       74.0 in Accession #:    [id number]     Weight:       152.1 lb Date of Birth:  [birth year]     BSA:          1.935 m Patient Age:    56 years       BP:           139/77 mmHg Patient Gender: M              HR:            102 bpm. Exam Location:  Inpatient Procedure: 2D Echo, Cardiac Doppler and Color Doppler Indications:    CVA  History:        Patient has prior history of Echocardiogram examinations.  Sonographer:    NA Referring Phys: [place]  1. Left ventricular ejection fraction, by estimation, is 65 to 70%. The left ventricle has normal function. The left ventricle has no regional wall motion abnormalities. There is mild left ventricular hypertrophy. Left ventricular diastolic parameters are consistent with Grade I diastolic dysfunction (impaired relaxation).  2. Right ventricular systolic function is normal. The right ventricular size is normal. Tricuspid regurgitation signal is inadequate for assessing PA pressure.  3. The mitral valve is normal in structure. No evidence of mitral valve regurgitation. No evidence of mitral stenosis.  4. The aortic valve was not well visualized. Aortic valve regurgitation is trivial. No aortic stenosis is present.  5. Aortic dilatation noted. There is mild dilatation of the aortic root, measuring 37 mm.  6. The inferior vena cava is normal in size with greater than 50% respiratory variability, suggesting right atrial pressure of 3 mmHg. FINDINGS  Left Ventricle: Left ventricular ejection fraction, by estimation, is 65 to 70%. The left ventricle has normal function. The left ventricle has no regional wall motion abnormalities. The left ventricular internal cavity size was normal in size. There is  mild left ventricular hypertrophy. Left ventricular diastolic parameters are consistent with Grade I diastolic dysfunction (impaired relaxation). Right Ventricle: The right ventricular size is normal. No increase in right ventricular wall thickness. Right ventricular systolic function is normal. Tricuspid regurgitation signal is inadequate for assessing PA pressure. Left Atrium: Left atrial size was normal in size. Right Atrium: Right atrial size was normal in size.  Pericardium: There is no evidence of pericardial effusion. Mitral Valve: The mitral valve is normal in structure. There is mild calcification of the mitral valve leaflet(s). Mild mitral annular calcification. No evidence of mitral valve regurgitation. No evidence of mitral valve stenosis. Tricuspid Valve: The tricuspid valve is normal in structure. Tricuspid valve regurgitation is not demonstrated. Aortic Valve: The aortic valve was not well visualized. Aortic valve regurgitation is trivial. Aortic regurgitation PHT measures 459 msec. No aortic stenosis is present. Aortic valve mean gradient measures 9.0 mmHg. Aortic valve peak gradient measures 14.9 mmHg. Aortic valve area, by VTI measures 2.34 cm. Pulmonic Valve: The pulmonic valve was normal in structure. Pulmonic valve regurgitation is not visualized. Aorta: Aortic dilatation noted. There is mild dilatation of the aortic root, measuring 37 mm. Venous: The inferior vena cava is normal in size with greater  than 50% respiratory variability, suggesting right atrial pressure of 3 mmHg. IAS/Shunts: No atrial level shunt detected by color flow Doppler.  LEFT VENTRICLE PLAX 2D LVIDd:         4.00 cm LVIDs:         3.00 cm LV PW:         1.00 cm LV IVS:        1.30 cm LVOT diam:     2.20 cm LV SV:         70 LV SV Index:   36 LVOT Area:     3.80 cm  RIGHT VENTRICLE RV S prime:     15.10 cm/s TAPSE (M-mode): 2.0 cm LEFT ATRIUM             Index       RIGHT ATRIUM           Index LA diam:        3.30 cm 1.71 cm/m  RA Area:     11.80 cm LA Vol (A2C):   35.2 ml 18.19 ml/m RA Volume:   22.20 ml  11.47 ml/m LA Vol (A4C):   27.6 ml 14.26 ml/m LA Biplane Vol: 31.8 ml 16.44 ml/m  AORTIC VALVE AV Area (Vmax):    2.15 cm AV Area (Vmean):   2.13 cm AV Area (VTI):     2.34 cm AV Vmax:           193.00 cm/s AV Vmean:          140.000 cm/s AV VTI:            0.300 m AV Peak Grad:      14.9 mmHg AV Mean Grad:      9.0 mmHg LVOT Vmax:         109.00 cm/s LVOT Vmean:         78.300 cm/s LVOT VTI:          0.185 m LVOT/AV VTI ratio: 0.62 AI PHT:            459 msec  AORTA Ao Root diam: 3.70 cm  SHUNTS Systemic VTI:  0.18 m Systemic Diam: 2.20 cm Loralie Champagne MD Electronically signed by Loralie Champagne MD Signature Date/Time: 06/12/2020/4:35:31 PM    Final        Subjective: No new complaints & wishes to go home now.  Discharge Exam: Vitals:   06/12/20 0811 06/12/20 1546  BP: (!) 148/83 (!) 140/96  Pulse: 96 82  Resp: 19 19  Temp: 98.1 F (36.7 C) 98.2 F (36.8 C)  SpO2: 96% 100%   Vitals:   06/12/20 0321 06/12/20 0456 06/12/20 0811 06/12/20 1546  BP: 125/86 (!) 148/83 (!) 148/83 (!) 140/96  Pulse: 96 (!) 102 96 82  Resp: 17 20 19 19   Temp: 97.7 F (36.5 C) 97.7 F (36.5 C) 98.1 F (36.7 C) 98.2 F (36.8 C)  TempSrc: Oral Oral Oral Oral  SpO2: 94% 95% 96% 100%  Weight:      Height:        General: Pt is alert, awake, not in acute distress, on RA, communicating well. Cardiovascular: RRR, S1/S2 +, no rubs, no gallops Respiratory: CTA bilaterally, no wheezing, no rhonchi Abdominal: Soft, NT, ND, bowel sounds + Extremities: no edema, no cyanosis    The results of significant diagnostics from this hospitalization (including imaging, microbiology, ancillary and laboratory) are listed below for reference.     Microbiology: Recent Results (from the past 240 hour(s))  Resp Panel by RT-PCR (Flu A&B, Covid) Nasopharyngeal Swab     Status: None   Collection Time: 06/11/20  4:43 PM   Specimen: Nasopharyngeal Swab; Nasopharyngeal(NP) swabs in vial transport medium  Result Value Ref Range Status   SARS Coronavirus 2 by RT PCR NEGATIVE NEGATIVE Final    Comment: (NOTE) SARS-CoV-2 target nucleic acids are NOT DETECTED.  The SARS-CoV-2 RNA is generally detectable in upper respiratory specimens during the acute phase of infection. The lowest concentration of SARS-CoV-2 viral copies this assay can detect is 138 copies/mL. A negative result does not  preclude SARS-Cov-2 infection and should not be used as the sole basis for treatment or other patient management decisions. A negative result may occur with  improper specimen collection/handling, submission of specimen other than nasopharyngeal swab, presence of viral mutation(s) within the areas targeted by this assay, and inadequate number of viral copies(<138 copies/mL). A negative result must be combined with clinical observations, patient history, and epidemiological information. The expected result is Negative.  Fact Sheet for Patients:  EntrepreneurPulse.com.au  Fact Sheet for Healthcare Providers:  IncredibleEmployment.be  This test is no t yet approved or cleared by the [place] FDA and  has been authorized for detection and/or diagnosis of SARS-CoV-2 by FDA under an Emergency Use Authorization (EUA). This EUA will remain  in effect (meaning this test can be used) for the duration of the COVID-19 declaration under Section 564(b)(1) of the Act, 21 U.S.C.section 360bbb-3(b)(1), unless the authorization is terminated  or revoked sooner.       Influenza A by PCR NEGATIVE NEGATIVE Final   Influenza B by PCR NEGATIVE NEGATIVE Final    Comment: (NOTE) The Xpert Xpress SARS-CoV-2/FLU/RSV plus assay is intended as an aid in the diagnosis of influenza from Nasopharyngeal swab specimens and should not be used as a sole basis for treatment. Nasal washings and aspirates are unacceptable for Xpert Xpress SARS-CoV-2/FLU/RSV testing.  Fact Sheet for Patients: EntrepreneurPulse.com.au  Fact Sheet for Healthcare Providers: IncredibleEmployment.be  This test is not yet approved or cleared by the [place] FDA and has been authorized for detection and/or diagnosis of SARS-CoV-2 by FDA under an Emergency Use Authorization (EUA). This EUA will remain in effect (meaning this test can be used) for the  duration of the COVID-19 declaration under Section 564(b)(1) of the Act, 21 U.S.C. section 360bbb-3(b)(1), unless the authorization is terminated or revoked.  Performed at [facility], [address] [place], [place] [address]      Labs: BNP (last 3 results) No results for input(s): BNP in the last 8760 hours. Basic Metabolic Panel: Recent Labs  Lab 06/11/20 1231  NA 137  K 3.7  CL 100  CO2 27  GLUCOSE 63*  BUN 23  CREATININE 0.96  CALCIUM 8.6*   Liver Function Tests: Recent Labs  Lab 06/11/20 1231  AST 19  ALT 15  ALKPHOS 92  BILITOT 0.7  PROT 7.5  ALBUMIN 3.5   No results for input(s): LIPASE, AMYLASE in the last 168 hours. No results for input(s): AMMONIA in the last 168 hours. CBC: Recent Labs  Lab 06/11/20 1231  WBC 10.0  NEUTROABS 7.4  HGB 13.8  HCT 41.9  MCV 92.5  PLT 171   Cardiac Enzymes: No results for input(s): CKTOTAL, CKMB, CKMBINDEX, TROPONINI in the last 168 hours. BNP: Invalid input(s): POCBNP CBG: No results for input(s): GLUCAP in the last 168 hours. D-Dimer No results for input(s): DDIMER in the last 72 hours. Hgb A1c Recent Labs  06/12/20 0122  HGBA1C 5.4   Lipid Profile Recent Labs    06/12/20 0122  CHOL 101  HDL 34*  LDLCALC 58  TRIG 47  CHOLHDL 3.0   Thyroid function studies Recent Labs    06/12/20 0122  TSH 2.842   Anemia work up No results for input(s): VITAMINB12, FOLATE, FERRITIN, TIBC, IRON, RETICCTPCT in the last 72 hours. Urinalysis No results found for: COLORURINE, APPEARANCEUR, [place], [place], GLUCOSEU, [place], [place], [place], PROTEINUR, UROBILINOGEN, NITRITE, LEUKOCYTESUR Sepsis Labs Invalid input(s): PROCALCITONIN,  WBC,  LACTICIDVEN Microbiology Recent Results (from the past 240 hour(s))  Resp Panel by RT-PCR (Flu A&B, Covid) Nasopharyngeal Swab     Status: None   Collection Time: 06/11/20  4:43 PM   Specimen: Nasopharyngeal Swab; Nasopharyngeal(NP) swabs in vial transport medium   Result Value Ref Range Status   SARS Coronavirus 2 by RT PCR NEGATIVE NEGATIVE Final    Comment: (NOTE) SARS-CoV-2 target nucleic acids are NOT DETECTED.  The SARS-CoV-2 RNA is generally detectable in upper respiratory specimens during the acute phase of infection. The lowest concentration of SARS-CoV-2 viral copies this assay can detect is 138 copies/mL. A negative result does not preclude SARS-Cov-2 infection and should not be used as the sole basis for treatment or other patient management decisions. A negative result may occur with  improper specimen collection/handling, submission of specimen other than nasopharyngeal swab, presence of viral mutation(s) within the areas targeted by this assay, and inadequate number of viral copies(<138 copies/mL). A negative result must be combined with clinical observations, patient history, and epidemiological information. The expected result is Negative.  Fact Sheet for Patients:  EntrepreneurPulse.com.au  Fact Sheet for Healthcare Providers:  IncredibleEmployment.be  This test is no t yet approved or cleared by the [place] FDA and  has been authorized for detection and/or diagnosis of SARS-CoV-2 by FDA under an Emergency Use Authorization (EUA). This EUA will remain  in effect (meaning this test can be used) for the duration of the COVID-19 declaration under Section 564(b)(1) of the Act, 21 U.S.C.section 360bbb-3(b)(1), unless the authorization is terminated  or revoked sooner.       Influenza A by PCR NEGATIVE NEGATIVE Final   Influenza B by PCR NEGATIVE NEGATIVE Final    Comment: (NOTE) The Xpert Xpress SARS-CoV-2/FLU/RSV plus assay is intended as an aid in the diagnosis of influenza from Nasopharyngeal swab specimens and should not be used as a sole basis for treatment. Nasal washings and aspirates are unacceptable for Xpert Xpress SARS-CoV-2/FLU/RSV testing.  Fact Sheet for  Patients: EntrepreneurPulse.com.au  Fact Sheet for Healthcare Providers: IncredibleEmployment.be  This test is not yet approved or cleared by the [place] FDA and has been authorized for detection and/or diagnosis of SARS-CoV-2 by FDA under an Emergency Use Authorization (EUA). This EUA will remain in effect (meaning this test can be used) for the duration of the COVID-19 declaration under Section 564(b)(1) of the Act, 21 U.S.C. section 360bbb-3(b)(1), unless the authorization is terminated or revoked.  Performed at [facility], [address] [place], [place] [address]      Time coordinating discharge: Over 30 minutes  SIGNED:   Mckinley Jewel, MD  [facility] 06/12/2020, 6:07 PM Pager   If 7PM-7AM, please contact night-coverage [email or web address]

## 2020-06-12 NOTE — Evaluation (Signed)
Physical Therapy Evaluation Patient Details Name: Thomas Mosley MRN: [id number] DOB: 05/22/1945 Today's Date: 06/12/2020   History of Present Illness  Pt is a 75 y.o. male who presents with critical R leg ischemia following an injury that occurred 2/24 where he was changing his propane tank and it hit his leg. He has had difficulty walking since the injury and his foot became numb, toes purple, and difficulty finding a pulse. 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 Suspecting cause of his new clumsiness is likely secondary to acute stroke and not acute R leg ischemia. PMH: HTN, HLD, CAD, COPD, and squamous cell carcinoma of skin.  Clinical Impression  Pt presents with condition above and deficits mentioned below, see PT Problem List. PTA, he was independent with all functional mobility without use of AD/AE. Currently, he displays mild R leg weakness compared to L (primarily in hip flexors and anterior tibialis), delayed bil ankle dynamic proprioception, slight decreased sensation to touch in L great toe, and gross R leg coordination deficits that impact his balance and thus safety and independence with all functional mobility. When not utilizing an AD/AE for mobility he demonstrates a stutter in his R foot, resulting in poor foot clearance with swing phase that places him at risk for more falls. His concentration on foot clearance and balance improved when utilizing a SPC. Educated pt and family on recommendations to utilize [facility] at home, be guarded by family on stairs (lead up with good and down with bad leg), and follow-up with Outpatient PT to maximize pt safety and independence with all functional mobility and thereby decrease his risk for falls. Will continue to follow acutely.     Follow Up  Recommendations Outpatient PT;Supervision for mobility/OOB    Equipment Recommendations  None recommended by PT    Recommendations for Other Services       Precautions / Restrictions Precautions Precautions: Fall Precaution Comments: HOH Restrictions Weight Bearing Restrictions: No      Mobility  Bed Mobility Overal bed mobility: Independent             General bed mobility comments: Pt able to transition supine > sit EOB quickly and safely with bed flat and no use of rails.    Transfers Overall transfer level: Needs assistance Equipment used: Rolling walker (2 wheeled);Straight cane Transfers: Sit to/from Stand Sit to Stand: Supervision         General transfer comment: Pt able to quickly power up to stand without overt LOB, 1x from EOB > RW and 1x from recliner > SPC. Supervision for safety.  Ambulation/Gait Ambulation/Gait assistance: Min guard Gait Distance (Feet): 85 Feet (x2 bouts of ~85 ft > ~90 ft) Assistive device: Rolling walker (2 wheeled);None;Straight cane;Quad cane Gait Pattern/deviations: Step-through pattern;Decreased stride length;Decreased dorsiflexion - right Gait velocity: reduced Gait velocity interpretation: 1.31 - 2.62 ft/sec, indicative of limited community ambulator General Gait Details: Progressed from RW > no AD/AE quickly during first bout. Pt ambulates with mild unsteadiness with intermittent difficulty coordinating R ankle dorsiflexion timing with swing phase, causing moments of toe drag/minor stumbling. Attempted use of SPC and quad-cane, cuing for sequencing of cane with R foot, with improved concentration of foot clearance and balance noted. No significant difference in stability noted between [facility] and quad-cane. Min guard for safety.  Stairs  Wheelchair Mobility    Modified Rankin (Stroke Patients Only) Modified Rankin (Stroke Patients Only) Pre-Morbid Rankin Score: No symptoms Modified Rankin: Moderate  disability     Balance Overall balance assessment: Needs assistance Sitting-balance support: No upper extremity supported;Feet supported Sitting balance-Leahy Scale: Normal Sitting balance - Comments: Reaches max off BOS without LOB, supervision for safety.   Standing balance support: No upper extremity supported;Bilateral upper extremity supported;Single extremity supported Standing balance-Leahy Scale: Good Standing balance comment: Pt able to be mobile without UE support but displays occasional mild stumbling thus benefits from 1-2 UE support.                             Pertinent Vitals/Pain Pain Assessment: No/denies pain    Home Living Family/patient expects to be discharged to:: Private residence Living Arrangements: Spouse/significant other Available Help at Discharge: Family;Available 24 hours/day Type of Home: House Home Access: Stairs to enter Entrance Stairs-Rails: Can reach both Entrance Stairs-Number of Steps: 5 Home Layout: One level Home Equipment: Shower seat;Bedside commode;Walker - 2 wheels;Cane - single point;Hand held shower head      Prior Function Level of Independence: Independent         Comments: Pt was independent with mobility without AD and driving self. Pt independent with bathing, cleaning, and dressing. Since CVA/injury last week he has been using a RW on occasion and had x1 fall in which he told his wife he caught himself on his knees before he hit the ground.     Hand Dominance   Dominant Hand: Right    Extremity/Trunk Assessment   Upper Extremity Assessment Upper Extremity Assessment: Defer to OT evaluation    Lower Extremity Assessment Lower Extremity Assessment: RLE deficits/detail RLE Deficits / Details: MMT: hip flexion 4-, knee extension 4+, knee flexion 4-, ankle dorsiflexion 4+ (for comparison L leg was 4+ to 5 grossly throughout) RLE Sensation: decreased light touch;decreased proprioception (decreased light  touch sensation dorsal great toe; delayed dynamic proprioception in ankle (L also delayed), intact in knees) RLE Coordination: decreased gross motor (no dysmetria or dysdiadochokinesia noted)       Communication   Communication: HOH  Cognition Arousal/Alertness: Awake/alert Behavior During Therapy: WFL for tasks assessed/performed Overall Cognitive Status: Within Functional Limits for tasks assessed                                        General Comments      Exercises     Assessment/Plan    PT Assessment Patient needs continued PT services  PT Problem List Decreased strength;Decreased activity tolerance;Decreased balance;Decreased mobility;Decreased coordination;Decreased safety awareness;Decreased knowledge of precautions;Decreased knowledge of use of DME;Impaired sensation       PT Treatment Interventions DME instruction;Gait training;Stair training;Functional mobility training;Therapeutic activities;Therapeutic exercise;Balance training;Neuromuscular re-education;Patient/family education    PT Goals (Current goals can be found in the Care Plan section)  Acute Rehab PT Goals Patient Stated Goal: to go home PT Goal Formulation: With patient/family Time For Goal Achievement: 06/26/20 Potential to Achieve Goals: Good    Frequency Min 4X/week   Barriers to discharge        Co-evaluation               AM-PAC PT "6 Clicks" Mobility  Outcome Measure Help needed turning from your back to your side while in a flat bed without using bedrails?: None  Help needed moving from lying on your back to sitting on the side of a flat bed without using bedrails?: None Help needed moving to and from a bed to a chair (including a wheelchair)?: A Little Help needed standing up from a chair using your arms (e.g., wheelchair or bedside chair)?: A Little Help needed to walk in hospital room?: A Little Help needed climbing 3-5 steps with a railing? : A Little 6 Click  Score: 20    End of Session Equipment Utilized During Treatment: Gait belt Activity Tolerance: Patient tolerated treatment well Patient left: in chair;with call bell/phone within reach;with chair alarm set;with family/visitor present Nurse Communication: Mobility status PT Visit Diagnosis: Unsteadiness on feet (R26.81);Other abnormalities of gait and mobility (R26.89);Muscle weakness (generalized) (M62.81);History of falling (Z91.81);Difficulty in walking, not elsewhere classified (R26.2);Other symptoms and signs involving the nervous system (R29.898)    Time: 1230-1315 PT Time Calculation (min) (ACUTE ONLY): 45 min   Charges:   PT Evaluation $PT Eval Moderate Complexity: 1 Mod PT Treatments $Gait Training: 8-22 mins $Therapeutic Activity: 8-22 mins        Moishe Spice, PT, DPT Acute Rehabilitation Services  Pager: [phone] Office: [place] 06/12/2020, 1:38 PM

## 2020-06-12 NOTE — Progress Notes (Addendum)
  Progress Note    06/12/2020 7:53 AM Hospital Day 1  Subjective:  Says his right foot is numb but does not hurt while raising his feet off the bed.  Wants to know when his surgery is.   afebrile  Vitals:   06/12/20 0321 06/12/20 0456  BP: 125/86 (!) 148/83  Pulse: 96 (!) 102  Resp: 17 20  Temp: 97.7 F (36.5 C) 97.7 F (36.5 C)  SpO2: 94% 95%    Physical Exam: Cardiac:  regular Lungs:  Non labored Extremities:  Bilateral feet are warm and well perfused with motor and sensory in tact.  There is a palpable right DP pulse.   CBC    Component Value Date/Time   WBC 10.0 06/11/2020 1231   RBC 4.53 06/11/2020 1231   HGB 13.8 06/11/2020 1231   HCT 41.9 06/11/2020 1231   PLT 171 06/11/2020 1231   MCV 92.5 06/11/2020 1231   MCH 30.5 06/11/2020 1231   MCHC 32.9 06/11/2020 1231   RDW 13.1 06/11/2020 1231   LYMPHSABS 0.7 06/11/2020 1231   MONOABS 1.8 (H) 06/11/2020 1231   EOSABS 0.1 06/11/2020 1231   BASOSABS 0.1 06/11/2020 1231    BMET    Component Value Date/Time   NA 137 06/11/2020 1231   NA 138 01/30/2020 1223   K 3.7 06/11/2020 1231   CL 100 06/11/2020 1231   CO2 27 06/11/2020 1231   GLUCOSE 63 (L) 06/11/2020 1231   BUN 23 06/11/2020 1231   BUN 24 01/30/2020 1223   CREATININE 0.96 06/11/2020 1231   CALCIUM 8.6 (L) 06/11/2020 1231   GFRNONAA >60 06/11/2020 1231   GFRAA 69 01/30/2020 1223    INR    Component Value Date/Time   INR 1.22 10/02/2015 1700     Intake/Output Summary (Last 24 hours) at 06/12/2020 0753 Last data filed at 06/12/2020 0300 Gross per 24 hour  Intake 375.83 ml  Output --  Net 375.83 ml     Assessment/Plan:  75 y.o. male who was admitted with possible critical limb ischemia but had good doppler flow to both feet. Hospital Day 1  -pt with palpable DP pulse this morning on the right.  He has motor and sensory in tact.  No need for urgent revascularization or anticoagulation currently.   -will need neuro consult today, which Dr. Donnetta Hutching  suspects most likely etiology for his new clumsiness is his acute stroke and not any acute ischemia in his right foot.  He discussed this with pt and his wife last evening.   Leontine Locket, PA-C [facility] [phone] 06/12/2020 7:53 AM   I have examined the patient, reviewed and agree with above.  Comfortable this morning.  No pain.  Asking when he can be discharged to home.  Both feet are warm and well perfused.  He actually has easily palpable right dorsalis pedis and posterior tibial pulse this morning.  No evidence of critical limb ischemia.  Suspect right leg symptoms are related to acute left brain stroke.  Recommend neurology consultation.  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  Curt Jews, MD 06/12/2020 8:43 AM

## 2020-06-12 NOTE — Progress Notes (Signed)
Echocardiogram 2D Echocardiogram has been performed.  Oneal Deputy Jaquise Faux 06/12/2020, 12:11 PM

## 2020-06-12 NOTE — Progress Notes (Addendum)
Telemetry box removed, CCMD notified. PIV removed. Site clean, dry and intact. Urinary foley catheter removed.  Patient tolerated well. Discharge instructions given to patient. Wife present. Home medications returned to patient. Patient educated need to void after foley removal. Patient taken to vehicle by wheelchair.  Daymon Larsen, RN

## 2020-06-12 NOTE — Progress Notes (Signed)
PROGRESS NOTE    Thomas Mosley  ZWC:[id number] DOB: 1945-08-30 DOA: 06/11/2020 PCP: Celene Squibb, MD   Brief Narrative:  Patient is 75 year old male with past medical history of hypertension, hyperlipidemia, coronary artery disease, COPD with current tobacco use presents to emergency department with right leg pain.  ED course: CT aorta with bifemoral shows acute occlusion of right popliteal area.  Vascular surgery consulted, CT head concerning for acute CVA-neurology consulted.  Patient admitted for further evaluation and management.  Assessment & Plan:   Acute ischemic stroke: -Reviewed CT head and MRI brain-shows 2.3 cm acute to early subacute infarct of posterior left lentiform nucleus/corona radiata. -Consult neurology-await recommendations -Allow permissive hypertension -Continue aspirin and statin.  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  No evidence of acute critical limb ischemia.  Patient has palpable dorsalis pedis and posterior tibial pulses on the right. -Vascular surgery consulted-recommend no need for urgent revascularization or anticoagulation at this time.  Recommend a neurology consult as his symptoms are likely related to CVA-appreciate vascular surgery's recommendation. -Continue aspirin and statin  Hypertension: -Allow permissive hypertension.  Hold home BP meds-losartan, metoprolol, HCTZ at this time.  - Treat BP only if blood pressure more than 220/120.  Hyperlipidemia: Check lipid panel -Continue statin  Coronary artery disease in 2017 with DES placement -Patient denies ACS symptoms.  Continue aspirin, statin  COPD with current tobacco abuse: -Does not use oxygen at home. -Continue Anoro. -Reports that he is working on smoking cessation.  Continue nicotine patch  GERD: Continue PPI  DVT prophylaxis: Lovenox Code Status: Full code Family Communication:  None  present at bedside.  Plan of care discussed with patient in length and he verbalized understanding and agreed with it. Disposition Plan: To be determined  Consultants:   Vascular surgery  Neurology  Procedures:   None  Antimicrobials:   None  Status is: Inpatient   Dispo: The patient is from: Home              Anticipated d/c is to: Home              Patient currently is not medically stable to d/c.   Difficult to place patient No         Subjective: Patient seen and examined.  Sitting comfortably on the bed.  Reports that he is doing well, his right leg pain and weakness has improved.  Denies headache, blurry vision, chest pain, shortness of breath, slurred speech, facial drop, nausea or vomiting.  Smokes 1 pack of cigarettes per day, denies alcohol, illicit drug use.  Objective: Vitals:   06/11/20 2304 06/12/20 0127 06/12/20 0321 06/12/20 0456  BP: 139/77 (!) 142/89 125/86 (!) 148/83  Pulse: 94 86 96 (!) 102  Resp: 18 20 17 20   Temp: (!) 97.1 F (36.2 C) 97.8 F (36.6 C) 97.7 F (36.5 C) 97.7 F (36.5 C)  TempSrc: Oral  Oral Oral  SpO2: 94% 97% 94% 95%  Weight:      Height:        Intake/Output Summary (Last 24 hours) at 06/12/2020 0802 Last data filed at 06/12/2020 0300 Gross per 24 hour  Intake 375.83 ml  Output --  Net 375.83 ml   Filed Weights   06/11/20 1204 06/11/20 1930 06/11/20 2102  Weight: 69.9 kg 69 kg 69 kg    Examination:  General exam: Appears calm and comfortable, on room air, hard of  hearing Respiratory system: Clear to auscultation. Respiratory effort normal. Cardiovascular system: S1 & S2 heard, RRR. No JVD, murmurs, rubs, gallops or clicks. No pedal edema. Gastrointestinal system: Abdomen is nondistended, soft and nontender. No organomegaly or masses felt. Normal bowel sounds heard. Central nervous system: Alert and oriented. No focal neurological deficits. Extremities: Symmetric 5 x 5 power. Skin: No rashes, lesions or  ulcers Psychiatry: Judgement and insight appear normal. Mood & affect appropriate.    Data Reviewed: I have personally reviewed following labs and imaging studies  CBC: Recent Labs  Lab 06/11/20 1231  WBC 10.0  NEUTROABS 7.4  HGB 13.8  HCT 41.9  MCV 92.5  PLT 025   Basic Metabolic Panel: Recent Labs  Lab 06/11/20 1231  NA 137  K 3.7  CL 100  CO2 27  GLUCOSE 63*  BUN 23  CREATININE 0.96  CALCIUM 8.6*   GFR: Estimated Creatinine Clearance: 63.9 mL/min (by C-G formula based on SCr of 0.96 mg/dL). Liver Function Tests: Recent Labs  Lab 06/11/20 1231  AST 19  ALT 15  ALKPHOS 92  BILITOT 0.7  PROT 7.5  ALBUMIN 3.5   No results for input(s): LIPASE, AMYLASE in the last 168 hours. No results for input(s): AMMONIA in the last 168 hours. Coagulation Profile: No results for input(s): INR, PROTIME in the last 168 hours. Cardiac Enzymes: No results for input(s): CKTOTAL, CKMB, CKMBINDEX, TROPONINI in the last 168 hours. BNP (last 3 results) No results for input(s): PROBNP in the last 8760 hours. HbA1C: No results for input(s): HGBA1C in the last 72 hours. CBG: No results for input(s): GLUCAP in the last 168 hours. Lipid Profile: No results for input(s): CHOL, HDL, LDLCALC, TRIG, CHOLHDL, LDLDIRECT in the last 72 hours. Thyroid Function Tests: No results for input(s): TSH, T4TOTAL, FREET4, T3FREE, THYROIDAB in the last 72 hours. Anemia Panel: No results for input(s): VITAMINB12, FOLATE, FERRITIN, TIBC, IRON, RETICCTPCT in the last 72 hours. Sepsis Labs: No results for input(s): PROCALCITON, LATICACIDVEN in the last 168 hours.  Recent Results (from the past 240 hour(s))  Resp Panel by RT-PCR (Flu A&B, Covid) Nasopharyngeal Swab     Status: None   Collection Time: 06/11/20  4:43 PM   Specimen: Nasopharyngeal Swab; Nasopharyngeal(NP) swabs in vial transport medium  Result Value Ref Range Status   SARS Coronavirus 2 by RT PCR NEGATIVE NEGATIVE Final    Comment:  (NOTE) SARS-CoV-2 target nucleic acids are NOT DETECTED.  The SARS-CoV-2 RNA is generally detectable in upper respiratory specimens during the acute phase of infection. The lowest concentration of SARS-CoV-2 viral copies this assay can detect is 138 copies/mL. A negative result does not preclude SARS-Cov-2 infection and should not be used as the sole basis for treatment or other patient management decisions. A negative result may occur with  improper specimen collection/handling, submission of specimen other than nasopharyngeal swab, presence of viral mutation(s) within the areas targeted by this assay, and inadequate number of viral copies(<138 copies/mL). A negative result must be combined with clinical observations, patient history, and epidemiological information. The expected result is Negative.  Fact Sheet for Patients:  EntrepreneurPulse.com.au  Fact Sheet for Healthcare Providers:  IncredibleEmployment.be  This test is no t yet approved or cleared by the [place] FDA and  has been authorized for detection and/or diagnosis of SARS-CoV-2 by FDA under an Emergency Use Authorization (EUA). This EUA will remain  in effect (meaning this test can be used) for the duration of the COVID-19 declaration under Section 564(b)(1)  of the Act, 21 U.S.C.section 360bbb-3(b)(1), unless the authorization is terminated  or revoked sooner.       Influenza A by PCR NEGATIVE NEGATIVE Final   Influenza B by PCR NEGATIVE NEGATIVE Final    Comment: (NOTE) The Xpert Xpress SARS-CoV-2/FLU/RSV plus assay is intended as an aid in the diagnosis of influenza from Nasopharyngeal swab specimens and should not be used as a sole basis for treatment. Nasal washings and aspirates are unacceptable for Xpert Xpress SARS-CoV-2/FLU/RSV testing.  Fact Sheet for Patients: EntrepreneurPulse.com.au  Fact Sheet for Healthcare  Providers: IncredibleEmployment.be  This test is not yet approved or cleared by the [place] FDA and has been authorized for detection and/or diagnosis of SARS-CoV-2 by FDA under an Emergency Use Authorization (EUA). This EUA will remain in effect (meaning this test can be used) for the duration of the COVID-19 declaration under Section 564(b)(1) of the Act, 21 U.S.C. section 360bbb-3(b)(1), unless the authorization is terminated or revoked.  Performed at [facility], [address], [place], [place] [address]       Radiology Studies: DG Chest 2 View  Result Date: 06/11/2020 CLINICAL DATA:  Status post trauma to the right leg. EXAM: CHEST - 2 VIEW COMPARISON:  May 31, 2020 FINDINGS: The heart size and mediastinal contours are within normal limits. There is marked severity calcification of the aortic arch. Both lungs are clear. The visualized skeletal structures are unremarkable. IMPRESSION: No active cardiopulmonary disease. Electronically Signed   By: Virgina Norfolk M.D.   On: 06/11/2020 17:14   CT Head Wo Contrast  Result Date: 06/11/2020 CLINICAL DATA:  Possible stroke EXAM: CT HEAD WITHOUT CONTRAST TECHNIQUE: Contiguous axial images were obtained from the base of the skull through the vertex without intravenous contrast. Sagittal and coronal MPR images reconstructed from axial data set. COMPARISON:  01/15/2013 FINDINGS: Brain: Generalized atrophy. Normal ventricular morphology. No midline shift or mass effect. Small vessel chronic ischemic changes of deep cerebral white matter. Low-attenuation focus at posterior aspect of LEFT basal ganglia and LEFT external capsule consistent with infarct, favor subacute to old. No intracranial hemorrhage, mass lesion, or definite evidence of acute infarction. No extra-axial fluid collections. Vascular: No hyperdense vessels. Atherosclerotic calcifications of internal carotid arteries at skull base Skull: Intact  Sinuses/Orbits: Clear Other: N/A IMPRESSION: Atrophy with small vessel chronic ischemic changes of deep cerebral white matter. Low-attenuation focus at posterior aspect of LEFT basal ganglia and LEFT external capsule consistent with infarct, favor subacute to old though new since 2014. No definite acute intracranial abnormalities. Electronically Signed   By: Lavonia Dana M.D.   On: 06/11/2020 14:31   MR ANGIO HEAD WO CONTRAST  Result Date: 06/11/2020 CLINICAL DATA:  Initial evaluation for acute stroke. EXAM: MRI HEAD WITHOUT CONTRAST MRA HEAD WITHOUT CONTRAST TECHNIQUE: Multiplanar, multiecho pulse sequences of the brain and surrounding structures were obtained without intravenous contrast. Angiographic images of the head were obtained using MRA technique without contrast. COMPARISON:  Prior CT from earlier the same day. FINDINGS: MRI HEAD FINDINGS Brain: Generalized age-related cerebral atrophy. Patchy and confluent T2/FLAIR hyperintensity within the periventricular and deep white matter both cerebral hemispheres, most consistent with chronic small vessel ischemic disease, mild to moderate in nature. Few scattered superimposed remote lacunar infarcts noted about the bilateral basal ganglia. Patchy involvement of the pons noted. Approximate 2.3 cm focus of curvilinear restricted diffusion in extending from the posterior left lentiform nucleus towards the periventricular white matter of the posterior left corona radiata, consistent with an acute to  early subacute ischemic infarct. No associated hemorrhage or mass effect. No other diffusion abnormality to suggest acute or subacute ischemia. Gray-white matter differentiation otherwise maintained. No encephalomalacia to suggest chronic cortical infarction elsewhere within the brain. No other evidence for acute or chronic intracranial hemorrhage. No mass lesion, midline shift or mass effect. No hydrocephalus or extra-axial fluid collection. Pituitary gland suprasellar  region within normal limits. Midline structures intact. Vascular: Major intracranial vascular flow voids are maintained. Skull and upper cervical spine: Craniocervical junction within normal limits. Bone marrow signal intensity within normal limits. No scalp soft tissue abnormality. Sinuses/Orbits: Patient status post bilateral ocular lens replacement. Globes and orbital soft tissues demonstrate no acute finding. Moderate chronic mucosal thickening noted throughout the paranasal sinuses. Few scattered superimposed retention cyst present within the maxillary sinuses bilaterally. Left-to-right nasal septal deviation with associated concha bullosa noted. Mastoid air cells are largely clear. Other: None. MRA HEAD FINDINGS ANTERIOR CIRCULATION: Examination degraded by motion artifact. Visualized distal cervical segments of the internal carotid arteries are patent with symmetric antegrade flow. Petrous segments widely patent. Multifocal atheromatous irregularity seen throughout the cavernous/supraclinoid ICAs without high-grade stenosis. 3 mm focal outpouching extending posteriorly from the proximal cavernous left ICA suspicious for a small aneurysm (series 10, image 93). An additional irregular and somewhat globular wide necked aneurysm measuring approximately 9 x 6 mm seen extending superiorly from the cavernous left ICA as well (series 10, image 109). A1 segments patent bilaterally. Normal anterior communicating artery complex. ACAs patent to their distal aspects without appreciable stenosis. Markedly hypoplastic right ACA noted. No appreciable M1 stenosis or occlusion. Grossly normal MCA bifurcations. Distal MCA branches grossly perfused and symmetric. POSTERIOR CIRCULATION: Both V4 segments patent to the vertebrobasilar junction without stenosis. Left vertebral artery slightly dominant. Neither PICA origin well visualized. Basilar patent to its distal aspect without stenosis. Superior cerebellar arteries grossly  patent at their origins. Both PCAs primarily supplied via the basilar and are well perfused to their distal aspects. IMPRESSION: MRI HEAD IMPRESSION: 1. 2.3 cm acute to early subacute ischemic nonhemorrhagic infarct involving the posterior left lentiform nucleus/corona radiata. 2. Underlying age-related cerebral atrophy with mild-to-moderate chronic microvascular ischemic disease. MRA HEAD IMPRESSION: 1. Technically limited exam due to motion artifact. 2. Negative intracranial MRA for large vessel occlusion. No hemodynamically significant or correctable stenosis identified. 3. 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 4. Additional 3 mm proximal cavernous left ICA aneurysm as above. Electronically Signed   By: Jeannine Boga M.D.   On: 06/11/2020 18:22   MR Brain Wo Contrast (neuro protocol)  Result Date: 06/11/2020 CLINICAL DATA:  Initial evaluation for acute stroke. EXAM: MRI HEAD WITHOUT CONTRAST MRA HEAD WITHOUT CONTRAST TECHNIQUE: Multiplanar, multiecho pulse sequences of the brain and surrounding structures were obtained without intravenous contrast. Angiographic images of the head were obtained using MRA technique without contrast. COMPARISON:  Prior CT from earlier the same day. FINDINGS: MRI HEAD FINDINGS Brain: Generalized age-related cerebral atrophy. Patchy and confluent T2/FLAIR hyperintensity within the periventricular and deep white matter both cerebral hemispheres, most consistent with chronic small vessel ischemic disease, mild to moderate in nature. Few scattered superimposed remote lacunar infarcts noted about the bilateral basal ganglia. Patchy involvement of the pons noted. Approximate 2.3 cm focus of curvilinear restricted diffusion in extending from the posterior left lentiform nucleus towards the periventricular white matter of the posterior left corona radiata, consistent with an acute to early subacute ischemic infarct. No associated hemorrhage or mass effect. No other  diffusion abnormality to  suggest acute or subacute ischemia. Gray-white matter differentiation otherwise maintained. No encephalomalacia to suggest chronic cortical infarction elsewhere within the brain. No other evidence for acute or chronic intracranial hemorrhage. No mass lesion, midline shift or mass effect. No hydrocephalus or extra-axial fluid collection. Pituitary gland suprasellar region within normal limits. Midline structures intact. Vascular: Major intracranial vascular flow voids are maintained. Skull and upper cervical spine: Craniocervical junction within normal limits. Bone marrow signal intensity within normal limits. No scalp soft tissue abnormality. Sinuses/Orbits: Patient status post bilateral ocular lens replacement. Globes and orbital soft tissues demonstrate no acute finding. Moderate chronic mucosal thickening noted throughout the paranasal sinuses. Few scattered superimposed retention cyst present within the maxillary sinuses bilaterally. Left-to-right nasal septal deviation with associated concha bullosa noted. Mastoid air cells are largely clear. Other: None. MRA HEAD FINDINGS ANTERIOR CIRCULATION: Examination degraded by motion artifact. Visualized distal cervical segments of the internal carotid arteries are patent with symmetric antegrade flow. Petrous segments widely patent. Multifocal atheromatous irregularity seen throughout the cavernous/supraclinoid ICAs without high-grade stenosis. 3 mm focal outpouching extending posteriorly from the proximal cavernous left ICA suspicious for a small aneurysm (series 10, image 93). An additional irregular and somewhat globular wide necked aneurysm measuring approximately 9 x 6 mm seen extending superiorly from the cavernous left ICA as well (series 10, image 109). A1 segments patent bilaterally. Normal anterior communicating artery complex. ACAs patent to their distal aspects without appreciable stenosis. Markedly hypoplastic right ACA noted. No  appreciable M1 stenosis or occlusion. Grossly normal MCA bifurcations. Distal MCA branches grossly perfused and symmetric. POSTERIOR CIRCULATION: Both V4 segments patent to the vertebrobasilar junction without stenosis. Left vertebral artery slightly dominant. Neither PICA origin well visualized. Basilar patent to its distal aspect without stenosis. Superior cerebellar arteries grossly patent at their origins. Both PCAs primarily supplied via the basilar and are well perfused to their distal aspects. IMPRESSION: MRI HEAD IMPRESSION: 1. 2.3 cm acute to early subacute ischemic nonhemorrhagic infarct involving the posterior left lentiform nucleus/corona radiata. 2. Underlying age-related cerebral atrophy with mild-to-moderate chronic microvascular ischemic disease. MRA HEAD IMPRESSION: 1. Technically limited exam due to motion artifact. 2. Negative intracranial MRA for large vessel occlusion. No hemodynamically significant or correctable stenosis identified. 3. 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 4. Additional 3 mm proximal cavernous left ICA aneurysm as above. Electronically Signed   By: Jeannine Boga M.D.   On: 06/11/2020 18:22   CT ANGIO AO+BIFEM W & OR WO CONTRAST  Result Date: 06/11/2020 CLINICAL DATA:  75 year old male with pulseless right lower extremity and discoloration of toes EXAM: CT ANGIOGRAPHY OF ABDOMINAL AORTA WITH ILIOFEMORAL RUNOFF TECHNIQUE: Multidetector CT imaging of the abdomen, pelvis and lower extremities was performed using the standard protocol during bolus administration of intravenous contrast. Multiplanar CT image reconstructions and MIPs were obtained to evaluate the vascular anatomy. CONTRAST:  155mL OMNIPAQUE IOHEXOL 350 MG/ML SOLN COMPARISON:  None. FINDINGS: VASCULAR Aorta: Normal caliber and patent throughout. Near circumferential atherosclerotic calcifications without significant stenosis. About the distal thoracic aorta, just proximal to the diaphragm is a  penetrating atheromatous ulcer with a patent neck measuring approximately 1.6 cm. The ulceration projects laterally to the right approximately 3.5 cm. Celiac: Mild ostial stenosis secondary to atherosclerotic plaque. Otherwise patent. SMA: Mild ostial stenosis secondary to atherosclerotic plaque. Otherwise patent. Renals: At least mild ostial stenosis of the bilateral single renal arteries which are otherwise patent without evidence of aneurysm, dissection, vasculitis, or fibromuscular dysplasia. IMA: Patent without evidence of aneurysm, dissection,  vasculitis or significant stenosis. RIGHT Lower Extremity Inflow: Patent common iliac artery with circumferential atherosclerotic calcifications. Patent internal iliac artery with focal fusiform aneurysmal change just proximal to the division or bifurcation measuring up to 1.5 cm. The external iliac artery is patent with circumferential coarse atherosclerotic calcifications. Outflow: The common femoral artery and profundus are patent. The superficial femoral artery is patent proximally with coarse near circumferential atherosclerotic calcifications throughout, resulting in at least mild multifocal stenoses. There is gradual tapering off at the level of the popliteal artery which is occluded. Runoff: Completely occluded. No evidence of inline flow to the right foot. LEFT Lower Extremity Inflow: Patent common iliac artery with coarse circumferential atherosclerotic calcifications. At least mild ostial stenosis of the internal iliac ostium with partially thrombosed proximal internal aneurysm measuring up to approximately 1.9 cm. The external iliac artery is patent with coarse circumferential atherosclerotic calcifications. Outflow: The common femoral artery is patent with coarse atherosclerotic calcifications. The profundal is patent. There is chronic total occlusion of the proximal superficial femoral artery with distal reconstitution at Hunter's canal. At least moderate  multifocal stenoses of the P1 segment of the popliteal artery secondary to coarse atherosclerotic plaque. Runoff: There is a high branching anterior tibial artery origin which arises from the distal P1 segment of the popliteal artery. The anterior tibial artery is patent proximally but tapers distally. The tibioperoneal trunk is patent proximally is coarse atherosclerotic plaques distally, extending into the proximal peroneal and posterior tibial arteries which appear patent but diminutive. The peroneal artery at the distal interosseous coarse. Inline flow to the foot is primarily by the posterior tibial artery. Veins: No obvious venous abnormality within the limitations of this arterial phase study. Review of the MIP images confirms the above findings. NON-VASCULAR Lower chest: Right basilar subsegmental atelectasis. Moderate centrilobular emphysema. The heart is normal in size. No pericardial effusion. Hepatobiliary: Liver is normal in size, contour, and attenuation. There are multifocal subcentimeter hypodensities, too small to characterize by CT, though likely representing simple hepatic cysts. The gallbladder is present unremarkable. No intra or extrahepatic biliary ductal dilation. Pancreas: Unremarkable. No pancreatic ductal dilatation or surrounding inflammatory changes. Spleen: Normal in size without focal abnormality. Adrenals/Urinary Tract: Adrenal glands are unremarkable. Right upper pole partially exophytic simple renal cyst measuring up to 1.5 cm. Kidneys are otherwise normal, without renal calculi, focal lesion, or hydronephrosis. Bladder is unremarkable. Stomach/Bowel: Stomach is within normal limits. Appendix appears normal. No evidence of bowel wall thickening, distention, or inflammatory changes. Lymphatic: No abdominopelvic lymphadenopathy. Reproductive: Prostate is unremarkable. Other: No abdominal wall hernia or abnormality. No abdominopelvic ascites. Musculoskeletal: No acute or significant  osseous findings. IMPRESSION: VASCULAR 1. Acute appearing occlusion of the right popliteal artery extending into the runoff vessels, likely secondary to ruptured atherosclerotic plaque. No evidence of inline flow to the right foot. 2. 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 3. 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 4. 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 NON-VASCULAR No acute abdominopelvic abnormality. Recommend emergent Vascular Surgery consultation. Ruthann Cancer, MD [facility] Electronically Signed   By: Ruthann Cancer MD   On: 06/11/2020 15:21    Scheduled Meds: .  stroke: mapping our early stages of recovery book   Does not apply Once  .  aspirin EC  81 mg Oral Daily  . atorvastatin  80 mg Oral Daily  . enoxaparin (LOVENOX) injection  40 mg Subcutaneous Q24H  . nicotine  14 mg Transdermal Once  . nicotine  21 mg Transdermal Daily  . pantoprazole  40 mg Oral Daily  . umeclidinium-vilanterol  1 puff Inhalation Daily   Continuous Infusions: . sodium chloride 50 mL/hr at 06/11/20 1929     LOS: 1 day   Time spent: 35 minutes  Manjot Beumer Loann Quill, MD [facility]  If 7PM-7AM, please contact night-coverage [email or web address] 06/12/2020, 8:02 AM

## 2020-06-12 NOTE — Progress Notes (Addendum)
Patients wife brought medications from home. Medications include: Loratadine 10mg  tab Pantoprazole 40mg  tab Losartan potassium 100mg  tab Metoprolol succ er 100mg  tab Atorvastatin 80mg  tab Hydrochlorothiazide 25mg  tab  Medications to be sent to pharmacy per wife.  Spoke with pharmacy to verify medications in chart per MD.   Daymon Larsen, RN

## 2020-06-12 NOTE — Evaluation (Signed)
Occupational Therapy Evaluation Patient Details Name: Thomas Mosley MRN: [id number] DOB: June 04, 1945 Today's Date: 06/12/2020    History of Present Illness Pt is a 75 y.o. male who presents with critical R leg ischemia following an injury that occurred 2/24 where he was changing his propane tank and it hit his leg. He has had difficulty walking since the injury and his foot became numb, toes purple, and difficulty finding a pulse. 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 Suspecting cause of his new clumsiness is likely secondary to acute stroke and not acute R leg ischemia. PMH: HTN, HLD, CAD, COPD, and squamous cell carcinoma of skin.   Clinical Impression   Patient admitted for the diagnosis above.  He presents with decreased coordination to R LE, and mild deficits to balance, but he is close to, if not at his prior level of function for ADL and in room mobility.  PT is following him in the acute setting.  No acute OT needs identified, and no post OT needs identified.  Recommend initial 24 hour supervision for a few days at home.      Follow Up Recommendations  No OT follow up    Equipment Recommendations  None recommended by OT    Recommendations for Other Services       Precautions / Restrictions Precautions Precautions: Fall Precaution Comments: HOH Restrictions Weight Bearing Restrictions: No      Mobility Bed Mobility Overal bed mobility: Independent             General bed mobility comments: Pt able to transition supine > sit EOB quickly and safely with bed flat and no use of rails.    Transfers Overall transfer level: Modified independent Equipment used: Rolling walker (2 wheeled);Straight cane Transfers: Sit to/from Stand Sit to Stand: Supervision          General transfer comment: Pt able to quickly power up to stand without overt LOB, 1x from EOB > RW and 1x from recliner > SPC. Supervision for safety.    Balance Overall balance assessment: Mild deficits observed, not formally tested Sitting-balance support: No upper extremity supported;Feet supported Sitting balance-Leahy Scale: Normal Sitting balance - Comments: Reaches max off BOS without LOB, supervision for safety.   Standing balance support: No upper extremity supported;Bilateral upper extremity supported;Single extremity supported Standing balance-Leahy Scale: Good Standing balance comment: Pt able to be mobile without UE support but displays occasional mild stumbling thus benefits from 1-2 UE support.                           ADL either performed or assessed with clinical judgement   ADL Overall ADL's : Modified independent                                       General ADL Comments: Patient is close to baseline function for ADL and functional in room mobility.  Reaching for objects in his environment.     Vision Patient Visual Report: No change from baseline       Perception     Praxis      Pertinent Vitals/Pain Pain Assessment: No/denies pain     Hand Dominance Right  Extremity/Trunk Assessment Upper Extremity Assessment Upper Extremity Assessment: Overall WFL for tasks assessed   Lower Extremity Assessment Lower Extremity Assessment: Defer to PT evaluation RLE Deficits / Details: MMT: hip flexion 4-, knee extension 4+, knee flexion 4-, ankle dorsiflexion 4+ (for comparison L leg was 4+ to 5 grossly throughout) RLE Sensation: decreased light touch;decreased proprioception (decreased light touch sensation dorsal great toe; delayed dynamic proprioception in ankle (L also delayed), intact in knees) RLE Coordination: decreased gross motor (no dysmetria or dysdiadochokinesia noted)   Cervical / Trunk Assessment Cervical / Trunk  Assessment: Normal   Communication Communication Communication: No difficulties;HOH   Cognition Arousal/Alertness: Awake/alert Behavior During Therapy: WFL for tasks assessed/performed Overall Cognitive Status: Within Functional Limits for tasks assessed                                     General Comments       Exercises     Shoulder Instructions      Home Living Family/patient expects to be discharged to:: Private residence Living Arrangements: Spouse/significant other Available Help at Discharge: Family;Available 24 hours/day Type of Home: House Home Access: Stairs to enter [employer] of Steps: 5 Entrance Stairs-Rails: Can reach both Home Layout: One level     Bathroom Shower/Tub: Walk-in shower         Home Equipment: Shower seat;Bedside commode;Walker - 2 wheels;Cane - single point;Hand held shower head          Prior Functioning/Environment Level of Independence: Independent        Comments: Independent with mobility, ADL/IADL.  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        OT Problem List: Impaired balance (sitting and/or standing)      OT Treatment/Interventions:      OT Goals(Current goals can be found in the care plan section) Acute Rehab OT Goals Patient Stated Goal: Hoping to go home OT Goal Formulation: With patient Time For Goal Achievement: 06/12/20 Potential to Achieve Goals: Fair  OT Frequency:     Barriers to D/C:  none noted          Co-evaluation              AM-PAC OT "6 Clicks" Daily Activity     Outcome Measure Help from another person eating meals?: None Help from another person taking care of personal grooming?: None Help from another person toileting, which includes using toliet, bedpan, or urinal?: None Help from another person bathing (including washing, rinsing, drying)?: None Help from another person to put on and taking off regular upper body clothing?: None Help from  another person to put on and taking off regular lower body clothing?: None 6 Click Score: 24   End of Session Equipment Utilized During Treatment: Gait belt Nurse Communication: Mobility status  Activity Tolerance: Patient tolerated treatment well Patient left: in chair;with call bell/phone within reach;with nursing/sitter in room;with family/visitor present  OT Visit Diagnosis: Unsteadiness on feet (R26.81)                Time: 7591-6384 OT Time Calculation (min): 22 min Charges:  OT General Charges $OT Visit: 1 Visit OT Evaluation $OT Eval Moderate Complexity: 1 Mod  06/12/2020  Rich, OTR/L  Acute Rehabilitation Services  Office:  [phone]   KEYSTON ARDOLINO 06/12/2020, 3:21 PM

## 2020-06-12 NOTE — Care Management Obs Status (Signed)
[place] NOTIFICATION   Patient Details  Name: Thomas Mosley MRN: [id number] Date of Birth: 1946-03-19   Medicare Observation Status Notification Given:  Yes    Erenest Rasher, RN 06/12/2020, 6:43 PM

## 2020-06-12 NOTE — Consult Note (Signed)
Neurology consult H&P  CC: R foot numbness, purple color change in right toe, walking difficulty   History is obtained from: patient and wife  HPI: Thomas Mosley is a 75 y.o. male with PMH significant for stroke risk factors of CAD with NSTEMI (s/p PCI to proximal RCA with DES 2017), HTN, HLD, Advanced age, Current every day cigarette smoker Family history of stroke (father), quite HOH (won't wear hearing aids per wife) presenting initially with concern for critical limb ischemia of the R leg after injury sustained when propane tank hit him in the leg on 2/23 with inability to walk /t pain since then. Right foot became numb, toes turned purple and no pulse could be found at urgent care prompting him to present to ED  for evaluation. He denies any spell or episode or symptoms of concern just prior to the injury or in the past couple of weeks. Subsequent vascular work up by Dr. Donnetta Hutching was without evidence of critical limb ischemia. Patient is currently reporting resolution of numbness and pain while at rest in his RLE. Family confirms no changes in speech that they have noticed and his soft voice is normal for him. They feel he is at his baseline mental status.   LKW: unknown, leg weakness onset 2/23 after apparent truamatic injury,   tpa given?: No IR Thrombectomy? No Modified Rankin Scale: 0-Completely asymptomatic and back to baseline post- stroke NIHSS: 0  ROS: A complete ROS was performed and is negative except as noted in the HPI.  Past Medical History:  Diagnosis Date  . COPD    not on home o2  . Coronary artery disease    NSTEMI 6/17 >> LHC: pLAD 50, pLCx 100 (L-L collats), oRCA 90, RPLB1 60 >> PCI:  atherectomy and DES to RCA // Echo 6/17: EF 55-60, Gr 1 DD, mild AI, mild MR  . Hypercholesterolemia   . Hypertension   . 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 Squamous cell carcinoma of  skin 05/14/2020   in situ- right zygomatic area  . Tobacco dependence    Family History  Problem Relation Age of Onset  . Heart attack Mother   . Stroke Father   . Heart attack Brother   . Heart attack Brother 24  . Anesthesia problems Neg Hx   . Hypotension Neg Hx   . Malignant hyperthermia Neg Hx   . Pseudochol deficiency Neg Hx   . Colon cancer Neg Hx    Social History: Current every day smoker, denies drug use, drinks one drink alcohol per week   Prior to Admission medications   Medication Sig Start Date End Date Taking? Authorizing Provider  albuterol (VENTOLIN HFA) 108 (90 Base) MCG/ACT inhaler Inhale 2 puffs into the lungs every 6 (six) hours as needed. 05/24/20  Yes [provider]  aspirin 81 MG EC tablet Take 1 tablet (81 mg total) by mouth daily. 01/16/20  Yes Weaver, Scott T, PA-C  atorvastatin (LIPITOR) 80 MG tablet TAKE 1 TABLET BY MOUTH EVERY DAY 01/22/20  Yes Sherren Mocha, MD  hydrochlorothiazide (HYDRODIURIL) 25 MG tablet Take 0.5 tablets (12.5 mg total) by mouth daily. 01/16/20  Yes Weaver, Scott T, PA-C  loratadine (CLARITIN) 10 MG tablet Take 10 mg by mouth daily. 05/24/20  Yes [provider]  losartan (COZAAR) 100 MG tablet Take 100 mg by mouth daily. 11/26/19  Yes [provider]  metoprolol succinate (TOPROL-XL) 100 MG 24 hr tablet TAKE 1 TABLET (100 MG TOTAL) BY MOUTH DAILY. TAKE WITH OR IMMEDIATELY FOLLOWING A MEAL. 11/15/19  Yes Sherren Mocha, MD  nitroGLYCERIN (NITROSTAT) 0.4 MG SL tablet Place 1 tablet (0.4 mg total) under the tongue every 5 (five) minutes x 3 doses as needed for chest pain. 10/04/15  Yes Reino Bellis B, NP  pantoprazole (PROTONIX) 40 MG tablet Take 40 mg by mouth daily. 09/30/15  Yes [provider]  umeclidinium-vilanterol (ANORO ELLIPTA) 62.5-25 MCG/INH AEPB Inhale 1 puff into the lungs daily. 06/11/19  Yes Avegno, Darrelyn Hillock, FNP   Exam: Current vital signs: BP (!) 148/83 (BP Location: Right Arm)    Pulse 96   Temp 98.1 F (36.7 C) (Oral)   Resp 19   Ht 6\' 2"  (1.88 m)   Wt 69 kg   SpO2 96%   BMI 19.53 kg/m   Physical Exam  Constitutional: Frail and thin appearing elderly male in NAD.  Appears well-developed and normocephalic, atraumatic.  Psych: Affect appropriate to situation Eyes: No scleral injection HENT: No OP obstrucion Head: Normocephalic.  Cardiovascular: Normal rate and regular rhythm. Teel reviewed. No apparent irregularity except occasional PVCs Respiratory: Effort normal and breath sounds normal to anterior ascultation GI: Soft.  No distension. There is no tenderness.  Skin: WDI  Neuro: Mental Status: Patient is awake, alert, oriented to person, place, month, year, and situation. Patient's ability to give history is impaired by his severe hearing loss.  No signs of aphasia or neglect.  Speech/Language: Mildly hypophonic.Clear and fluent.  Cranial Nerves: II: Visual Fields are full. Pupils are equal, round, and reactive to light. III,IV, VI: EOMI without ptosis or diploplia.  V: Facial sensation is symmetric to temperature VII: Facial movement is symmetric.  VIII: hearing is intact to voice X: Uvula elevates symmetrically XI: Shoulder shrug is symmetric. XII: tongue is midline without atrophy or fasciculations.  Motor: Tone is normal. Bulk is normal. 5/5 strength was present in all four extremities.  Sensory: Sensation is symmetric to light touch in the arms and legs.  Deep Tendon Reflexes: 2+ and symmetric in the biceps and 1+patellae. Plantars: Toes are downgoing bilaterally.  Cerebellar: FNF and HKS are intact bilaterally. No drift upper or lower extremities.   I have reviewed labs in epic and the pertinent results are:    I have reviewed the images obtained: NCT head  Low-attenuation focus at posterior aspect of LEFT basal ganglia and LEFT external capsule consistent with infarct, favor subacute to old, atrophy with small vessel chronic  ischemic changes of deep cerebral white matter.  MRI brain without contrast. 2.3 cm acute to early subacute ischemic posterior left lentiform nucleus/corona radiata.Underlying age-related cerebral atrophy with mild-to-moderate chronic microvascular ischemic disease. -Also with irregular 9x6 mm cavernous ICA aneurysm and 3 mm proximal cavernous L ICA aneurysm  Assessment:  RYOT BURROUS is a 75 y.o. male with PMH significant for stroke risk factors of CAD with NSTEMI (s/p PCI to proximal RCA with DES 2017), HTN, HLD, Advanced age, Current every day cigarette smoker Family history of stroke (father), quite HOH (won't wear hearing aids per wife) presenting initially with concern for critical limb ischemia of the R leg after injury sustained when propane tank hit him in the leg on 2/23 with inability to walk d/t pain per patient's subjective perception since then. Found to have no critical right leg ischemia with resolution of numbness, pain and color changes in  foot. CVA found on further work up for walking difficulty.   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 --Passed swallow screen and cleared for heart healthy diet.   Recommend Stroke education. - Recommend PT/OT/SLP consults  Stroke management: - Aspirin 81mg  daily (On ASA 81mg  PTA) - Clopidogrel 75mg  daily for 3 weeks. - SBP goal - Permissive hypertension first 24 h < 220/110,  Stroke Risk Factors  Advanced Age >/= 66   Cigarette smoker, current every day use, advised to stop smoking stop using due to stroke risk and COPD. He is not currently amenable.   Family hx stroke (father)  Coronary artery disease  Left ICA aneurysms MRI showing Irregular 9x6 mm cavernous ICA aneurysm and 3 mm proximal  cavernous L ICA aneurysm  06/12/2020, 8:29 AM   I have seen the patient and reviewed the above note. Recommendations as above.   Thomas Rack, MD [facility] [phone]  If 7pm- 7am, please page neurology on call as listed in [place].

## 2020-06-18 ENCOUNTER — Other Ambulatory Visit: Payer: Self-pay | Admitting: Internal Medicine

## 2020-06-21 ENCOUNTER — Telehealth: Payer: Self-pay | Admitting: Cardiovascular Disease

## 2020-06-21 MED ORDER — LOSARTAN POTASSIUM 100 MG PO TABS: 100.0000 mg | ORAL_TABLET | Freq: Every day | ORAL | 3 refills | Status: DC

## 2020-06-21 NOTE — Telephone Encounter (Signed)
Spoke with patient's wife to let her know. Refilled losartan 100 mg daily to pharmacy where there are in [place].

## 2020-06-21 NOTE — Telephone Encounter (Signed)
Pt c/o medication issue:  1. Name of Medication:  losartan (COZAAR) 100 MG tablet  2. How are you currently taking this medication (dosage and times per day)? As written  3. Are you having a reaction (difficulty breathing--STAT)? No   4. What is your medication issue? Patient needs an immediate new prescription medication sent to [facility] [place], [place] - [address]

## 2020-06-21 NOTE — Telephone Encounter (Signed)
*  STAT* If patient is at the pharmacy, call can be transferred to refill team.   1. Which medications need to be refilled? (please list name of each medication and dose if known)  losartan (COZAAR) 100 MG tablet [[id number]   2. Which pharmacy/location (including street and city if local pharmacy) is medication to be sent to?  [id number] - [place], [place] - [address] [facility]  [place], [place] [address]  Phone:  [phone] Fax:  [phone]   3. Do they need a 30 day or 90 day supply? 90  Pt is completely out

## 2020-06-21 NOTE — Telephone Encounter (Signed)
This has already been addressed today, see other telephone encounter.

## 2020-07-22 ENCOUNTER — Other Ambulatory Visit: Payer: Self-pay | Admitting: *Deleted

## 2020-07-22 DIAGNOSIS — I739 Peripheral vascular disease, unspecified: Secondary | ICD-10-CM

## 2020-07-24 ENCOUNTER — Encounter: Payer: Self-pay | Admitting: *Deleted

## 2020-07-26 ENCOUNTER — Ambulatory Visit (HOSPITAL_COMMUNITY)
Admission: RE | Admit: 2020-07-26 | Discharge: 2020-07-26 | Disposition: A | Discharge status: Home / Self Care | Payer: Medicare HMO | Source: Ambulatory Visit | Attending: Vascular Surgery | Admitting: Vascular Surgery

## 2020-07-26 ENCOUNTER — Other Ambulatory Visit: Payer: Self-pay

## 2020-07-26 DIAGNOSIS — I739 Peripheral vascular disease, unspecified: Secondary | ICD-10-CM | POA: Diagnosis present

## 2020-07-29 ENCOUNTER — Ambulatory Visit: Payer: Medicare HMO | Admitting: Diagnostic Neuroimaging

## 2020-07-29 ENCOUNTER — Encounter: Payer: Self-pay | Admitting: Diagnostic Neuroimaging

## 2020-07-29 ENCOUNTER — Encounter: Payer: Self-pay | Admitting: Vascular Surgery

## 2020-07-29 ENCOUNTER — Other Ambulatory Visit: Payer: Self-pay

## 2020-07-29 ENCOUNTER — Ambulatory Visit: Payer: Medicare HMO | Admitting: Vascular Surgery

## 2020-07-29 VITALS — BP 169/85 | HR 67 | Temp 97.7°F | Resp 18 | Ht 74.0 in | Wt 151.0 lb

## 2020-07-29 VITALS — BP 175/78 | HR 55 | Ht 74.0 in | Wt 151.6 lb

## 2020-07-29 DIAGNOSIS — I639 Cerebral infarction, unspecified: Secondary | ICD-10-CM

## 2020-07-29 DIAGNOSIS — I739 Peripheral vascular disease, unspecified: Secondary | ICD-10-CM

## 2020-07-29 NOTE — Progress Notes (Signed)
[facility]  PATIENT: Thomas Mosley DOB: 1945-12-05  REFERRING CLINICIAN: Celene Squibb, MD HISTORY FROM: patient  REASON FOR VISIT: new consult    HISTORICAL  CHIEF COMPLAINT:  Chief Complaint  Patient presents with  . Stroke    Rm 7 New Pt, hospital FU wife- Olegario Shearer  "completed PT, doing well"    HISTORY OF PRESENT ILLNESS:   75 year old male here for evaluation of right leg weakness.  History of coronary artery disease, coronary stent, hypertension, hyperlipidemia, smoking.  Patient was moving a propane tank on 06/05/2020 when it slipped and hit him in the leg.  His right foot was numb and purple.  He was evaluated at urgent care and then referred to ER for evaluation.  Patient had CT and MRI which showed acute left corona radiata, nasal ganglia ischemic infarction.  Stroke work-up was completed.  He was found to have incidental left ICA aneurysms.  Also had evaluation of right lower extremity peripheral vascular disease, felt to be chronic in nature.  Since that time patient is doing well.  His right leg weakness has improved.  He declines to quit smoking.  He feels like he does not have any significant problems.  Blood pressure is quite high today.  He does not check blood pressure at home.  Also is very hard of hearing but does not want to use hearing aids.  His wife tries to encourage him to keep up with his medical issues but the situation is challenging because he declines a lot of her recommendations.    REVIEW OF SYSTEMS: Full 14 system review of systems performed and negative with exception of: As per HPI.  ALLERGIES: No Known Allergies  HOME MEDICATIONS: Outpatient Medications Prior to Visit  Medication Sig Dispense Refill  . albuterol (VENTOLIN HFA) 108 (90 Base) MCG/ACT inhaler Inhale 2 puffs into the lungs every 6 (six) hours as needed.    Marland Kitchen aspirin 81 MG EC tablet Take 1 tablet (81 mg total) by mouth daily. 30 tablet   . atorvastatin  (LIPITOR) 80 MG tablet TAKE 1 TABLET BY MOUTH EVERY DAY 90 tablet 3  . hydrochlorothiazide (HYDRODIURIL) 25 MG tablet Take 0.5 tablets (12.5 mg total) by mouth daily. 45 tablet 3  . loratadine (CLARITIN) 10 MG tablet Take 10 mg by mouth daily.    Marland Kitchen losartan (COZAAR) 100 MG tablet Take 1 tablet (100 mg total) by mouth daily. 90 tablet 3  . metoprolol succinate (TOPROL-XL) 100 MG 24 hr tablet TAKE 1 TABLET (100 MG TOTAL) BY MOUTH DAILY. TAKE WITH OR IMMEDIATELY FOLLOWING A MEAL. 90 tablet 3  . nitroGLYCERIN (NITROSTAT) 0.4 MG SL tablet Place 1 tablet (0.4 mg total) under the tongue every 5 (five) minutes x 3 doses as needed for chest pain. 25 tablet 3  . pantoprazole (PROTONIX) 40 MG tablet Take 40 mg by mouth daily.  12  . umeclidinium-vilanterol (ANORO ELLIPTA) 62.5-25 MCG/INH AEPB Inhale 1 puff into the lungs daily. 1 each 1   No facility-administered medications prior to visit.    PAST MEDICAL HISTORY: Past Medical History:  Diagnosis Date  . COPD    not on home o2  . Coronary artery disease    NSTEMI 6/17 >> LHC: pLAD 50, pLCx 100 (L-L collats), oRCA 90, RPLB1 60 >> PCI:  atherectomy and DES to RCA // Echo 6/17: EF 55-60, Gr 1 DD, mild AI, mild MR  . Hypercholesterolemia   . Hypertension   . SCC (squamous cell carcinoma)  06/12/2011   left ear rim- tx p bx  . 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 Stroke ([place]) 06/2020  . Tobacco dependence     PAST SURGICAL HISTORY: Past Surgical History:  Procedure Laterality Date  . CARDIAC CATHETERIZATION N/A 10/02/2015   Procedure: Left Heart Cath and Coronary Angiography;  Surgeon: Sherren Mocha, MD;  Location: [place] [facility];  Service: Cardiovascular;  Laterality: N/A;  . CARDIAC CATHETERIZATION N/A 10/02/2015   Procedure: Intravascular Ultrasound/IVUS;  Surgeon: Sherren Mocha, MD;  Location: [place] [facility];  Service:  Cardiovascular;  Laterality: N/A;  . CARDIAC CATHETERIZATION N/A 10/03/2015   Procedure: Coronary Stent Intervention Rotablator;  Surgeon: Sherren Mocha, MD;  Location: [place] [facility];  Service: Cardiovascular;  Laterality: N/A;  . CATARACT EXTRACTION W/PHACO  02/02/2011   Procedure: CATARACT EXTRACTION PHACO AND INTRAOCULAR LENS PLACEMENT (IOC);  Surgeon: Tonny Branch;  Location: [facility];  Service: Ophthalmology;  Laterality: Left;  CDE 12.33  . COLONOSCOPY  02/13/2004   WLN:[id number] polyp at 30 cm resected/normal rectum. Path received from [facility] stating "microscopic focus of intramucosal carcinoma in situ arising in tubular adenoma, margin clear.   . COLONOSCOPY N/A 11/23/2012   Procedure: COLONOSCOPY;  Surgeon: Daneil Dolin, MD;  Location: [facility];  Service: Endoscopy;  Laterality: N/A;  12:15-rescheduled to 11:00am Darius Bump notified pt    FAMILY HISTORY: Family History  Problem Relation Age of Onset  . Heart attack Mother   . Stroke Father   . Heart attack Brother   . Heart attack Brother 53  . Anesthesia problems Neg Hx   . Hypotension Neg Hx   . Malignant hyperthermia Neg Hx   . Pseudochol deficiency Neg Hx   . Colon cancer Neg Hx     SOCIAL HISTORY: Social History   Socioeconomic History  . Marital status: Married    Spouse name: Olegario Shearer  . Number of children: 3  . Years of education: Not on file  . Highest education level: 10th grade  Occupational History    Comment: retired  Tobacco Use  . Smoking status: Current Every Day Smoker    Packs/day: 0.25    Years: 40.00    Pack years: 10.00    Types: Cigarettes  . Smokeless tobacco: Never Used  Vaping Use  . Vaping Use: Never used  Substance and Sexual Activity  . Alcohol use: Yes    Alcohol/week: 1.0 standard drink    Types: 1 Standard drinks or equivalent per week    Comment: 4/158/22 1 beer daily  . Drug use: No  . Sexual activity: Yes  Other Topics Concern  . Not on file  Social History Narrative    Lives with wife   Social Determinants of Health   Financial Resource Strain: Not on file  Food Insecurity: Not on file  Transportation Needs: Not on file  Physical Activity: Not on file  Stress: Not on file  Social Connections: Not on file  Intimate Partner Violence: Not on file     PHYSICAL EXAM  GENERAL EXAM/CONSTITUTIONAL: Vitals:  Vitals:   07/29/20 0927  BP: (!) 175/78  Pulse: (!) 55  Weight: 151 lb 9.6 oz (68.8 kg)  Height: 6\' 2"  (1.88 m)     Body mass index is 19.46 kg/m. Wt Readings from Last 3 Encounters:  07/29/20 151 lb 9.6 oz (68.8 kg)  06/11/20  152 lb 1.9 oz (69 kg)  01/16/20 154 lb (69.9 kg)     Patient is in no distress; well developed, nourished and groomed; neck is supple  CARDIOVASCULAR:  Examination of carotid arteries is normal; no carotid bruits  Regular rate and rhythm, no murmurs  Examination of peripheral vascular system by observation and palpation is normal  EYES:  Ophthalmoscopic exam of optic discs and posterior segments is normal; no papilledema or hemorrhages  No exam data present  MUSCULOSKELETAL:  Gait, strength, tone, movements noted in Neurologic exam below  NEUROLOGIC: MENTAL STATUS:  No flowsheet data found.  awake, alert, oriented to person, place and time  recent and remote memory intact  normal attention and concentration  language fluent, comprehension intact, naming intact  fund of knowledge appropriate  CRANIAL NERVE:   2nd - no papilledema on fundoscopic exam  2nd, 3rd, 4th, 6th - pupils equal and reactive to light, visual fields full to confrontation, extraocular muscles intact, no nystagmus  5th - facial sensation symmetric  7th - facial strength symmetric  8th - hearing intact  9th - palate elevates symmetrically, uvula midline  11th - shoulder shrug symmetric  12th - tongue protrusion midline  MOTOR:   normal bulk and tone, full strength in the BUE, BLE  SENSORY:   normal  and symmetric to light touch, temperature, vibration  COORDINATION:   finger-nose-finger, fine finger movements normal  REFLEXES:   deep tendon reflexes present and symmetric  GAIT/STATION:   narrow based gait     DIAGNOSTIC DATA (LABS, IMAGING, TESTING) - I reviewed patient records, labs, notes, testing and imaging myself where available.  Lab Results  Component Value Date   WBC 10.0 06/11/2020   HGB 13.8 06/11/2020   HCT 41.9 06/11/2020   MCV 92.5 06/11/2020   PLT 171 06/11/2020      Component Value Date/Time   NA 137 06/11/2020 1231   NA 138 01/30/2020 1223   K 3.7 06/11/2020 1231   CL 100 06/11/2020 1231   CO2 27 06/11/2020 1231   GLUCOSE 63 (L) 06/11/2020 1231   BUN 23 06/11/2020 1231   BUN 24 01/30/2020 1223   CREATININE 0.96 06/11/2020 1231   CALCIUM 8.6 (L) 06/11/2020 1231   PROT 7.5 06/11/2020 1231   ALBUMIN 3.5 06/11/2020 1231   AST 19 06/11/2020 1231   ALT 15 06/11/2020 1231   ALKPHOS 92 06/11/2020 1231   BILITOT 0.7 06/11/2020 1231   GFRNONAA >60 06/11/2020 1231   GFRAA 69 01/30/2020 1223   Lab Results  Component Value Date   CHOL 101 06/12/2020   HDL 34 (L) 06/12/2020   LDLCALC 58 06/12/2020   TRIG 47 06/12/2020   CHOLHDL 3.0 06/12/2020   Lab Results  Component Value Date   HGBA1C 5.4 06/12/2020   No results found for: VITAMINB12 Lab Results  Component Value Date   TSH 2.842 06/12/2020    MRI HEAD IMPRESSION: 1. 2.3 cm acute to early subacute ischemic nonhemorrhagic infarct involving the posterior left lentiform nucleus/corona radiata. 2. Underlying age-related cerebral atrophy with mild-to-moderate chronic microvascular ischemic disease.  MRA HEAD IMPRESSION: 1. Technically limited exam due to motion artifact. 2. Negative intracranial MRA for large vessel occlusion. No hemodynamically significant or correctable stenosis identified. 3. 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 4. Additional 3 mm proximal cavernous left  ICA aneurysm as above.  TTE 1. Left ventricular ejection fraction, by estimation, is 65 to 70%. The  left ventricle has normal function. The  left ventricle has no regional  wall motion abnormalities. There is mild left ventricular hypertrophy.  Left ventricular diastolic parameters  are consistent with Grade I diastolic dysfunction (impaired relaxation).  2. Right ventricular systolic function is normal. The right ventricular  size is normal. Tricuspid regurgitation signal is inadequate for assessing  PA pressure.  3. The mitral valve is normal in structure. No evidence of mitral valve  regurgitation. No evidence of mitral stenosis.  4. The aortic valve was not well visualized. Aortic valve regurgitation  is trivial. No aortic stenosis is present.  5. Aortic dilatation noted. There is mild dilatation of the aortic root,  measuring 37 mm.  6. The inferior vena cava is normal in size with greater than 50%  respiratory variability, suggesting right atrial pressure of 3 mmHg.  07/26/20 ABI - Evidence of moderate peripheral vascular disease in the left lower extremity with resting ABI of 0.70. Normal resting ABI of 1.03 on the right. Distal waveform abnormalities present at the level of both dorsalis pedis arteries and the left posterior tibial artery.    ASSESSMENT AND PLAN  75 y.o. year old male here with:  Dx:  1. 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 VAS US CAROTID   Return for return to PCP, pending if symptoms worsen or fail to improve.    Penni Bombard, MD 12/06/4156, 30:94 AM Certified in Neurology, Neurophysiology and Neuroimaging  [facility] [address], [place], [place] [address] [phone]

## 2020-07-29 NOTE — Progress Notes (Signed)
[facility]  Patient name: Thomas Mosley MRN: [id number] DOB: 1945-09-17 Sex: male  REASON FOR VISIT: Follow-up from recent hospital consultation  HPI: Thomas Mosley is a 75 y.o. male here today for follow-up.  He is here today with his wife.  He has a very confusing recent hospitalization.  He initially presented to urgent care with clumsiness in his right foot.  There was some question regarding arterial flow and he was sent to [facility] and then transferred to [facility] for further evaluation.  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  It also became apparent that he was having neurologic deficit in his right lower extremity in excess of what would be expected from his arterial flow.  He underwent further neurologic work-up revealing acute left brain stroke.  It appeared as though his right foot symptoms were related to stroke.  He was found to have normal dorsalis pedis and posterior tibial pulse on the right.  He is seen today for further follow-up.  He reports he is having no lower extremity symptoms.  Has had resolution of the weakness that he had had from his stroke.  His wife reports that he occasionally has episodes of coolness in his right foot and that this is resolved with a heating pad.  Past Medical History:  Diagnosis Date  . COPD    not on home o2  . Coronary artery disease    NSTEMI 6/17 >> LHC: pLAD 50, pLCx 100 (L-L collats), oRCA 90, RPLB1 60 >> PCI:  atherectomy and DES to RCA // Echo 6/17: EF 55-60, Gr 1 DD, mild AI, mild MR  . Hypercholesterolemia   . Hypertension   . 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 Squamous cell carcinoma of skin 05/14/2020   in  situ- right zygomatic area  . Stroke ([place]) 06/2020  . Tobacco dependence     Family History  Problem Relation Age of Onset  . Heart attack Mother   . Stroke Father   . Heart attack Brother   . Heart attack Brother 7  . Anesthesia problems Neg Hx   . Hypotension Neg Hx   . Malignant hyperthermia Neg Hx   . Pseudochol deficiency Neg Hx   . Colon cancer Neg Hx     SOCIAL HISTORY: Social History   Tobacco Use  . Smoking status: Current Every Day Smoker    Packs/day: 0.25    Years: 40.00    Pack years: 10.00    Types: Cigarettes  . Smokeless tobacco: Never Used  Substance Use Topics  . Alcohol use: Yes    Alcohol/week: 1.0 standard drink    Types: 1 Standard drinks or equivalent per week    Comment: 4/158/22 1 beer daily    No Known Allergies  Current Outpatient Medications  Medication Sig Dispense Refill  . albuterol (VENTOLIN HFA) 108 (90 Base) MCG/ACT inhaler Inhale 2 puffs into the lungs every 6 (six) hours as needed.    Marland Kitchen aspirin 81 MG EC tablet Take 1 tablet (81 mg total) by mouth daily. 30 tablet   . atorvastatin (LIPITOR) 80 MG tablet TAKE 1 TABLET BY MOUTH EVERY DAY 90 tablet 3  . hydrochlorothiazide (HYDRODIURIL) 25  MG tablet Take 0.5 tablets (12.5 mg total) by mouth daily. 45 tablet 3  . loratadine (CLARITIN) 10 MG tablet Take 10 mg by mouth daily.    Marland Kitchen losartan (COZAAR) 100 MG tablet Take 1 tablet (100 mg total) by mouth daily. 90 tablet 3  . metoprolol succinate (TOPROL-XL) 100 MG 24 hr tablet TAKE 1 TABLET (100 MG TOTAL) BY MOUTH DAILY. TAKE WITH OR IMMEDIATELY FOLLOWING A MEAL. 90 tablet 3  . pantoprazole (PROTONIX) 40 MG tablet Take 40 mg by mouth daily.  12  . umeclidinium-vilanterol (ANORO ELLIPTA) 62.5-25 MCG/INH AEPB Inhale 1 puff into the lungs daily. 1 each 1  . nitroGLYCERIN (NITROSTAT) 0.4 MG SL tablet Place 1 tablet (0.4 mg total) under the tongue every 5 (five) minutes x 3 doses as needed for chest pain. (Patient not taking: Reported on 07/29/2020)  25 tablet 3   No current facility-administered medications for this visit.    REVIEW OF SYSTEMS:  [X]  denotes positive finding, [ ]  denotes negative finding Cardiac  Comments:  Chest pain or chest pressure:    Shortness of breath upon exertion:    Short of breath when lying flat:    Irregular heart rhythm:        Vascular    Pain in calf, thigh, or hip brought on by ambulation:    Pain in feet at night that wakes you up from your sleep:     Blood clot in your veins:    Leg swelling:           PHYSICAL EXAM: Vitals:   07/29/20 1449  BP: (!) 169/85  Pulse: 67  Resp: 18  Temp: 97.7 F (36.5 C)  TempSrc: Temporal  SpO2: 99%  Weight: 151 lb (68.5 kg)  Height: 6\' 2"  (1.88 m)    GENERAL: The patient is a well-nourished male, in no acute distress. The vital signs are documented above. CARDIOVASCULAR: Both feet well perfused.  He has 2+ right dorsalis pedis and posterior tibial pulse.  I do not palpate pedal pulses on the left PULMONARY: There is good air exchange  MUSCULOSKELETAL: There are no major deformities or cyanosis. NEUROLOGIC: No focal weakness or paresthesias are detected. SKIN: There are no ulcers or rashes noted. PSYCHIATRIC: The patient has a normal affect.  DATA:  He subsequently undergone noninvasive studies and this reveals ankle arm index of normal at 1.0 on the right and 0.70 on the left  MEDICAL ISSUES: No evidence of ischemia in his right foot.  Still unclear as to the timing of his right popliteal occlusion.  He continues to have normal pulses and no symptoms.  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  I have recommended that we see him in 1 year with ultrasound of his aorta iliac segments to rule out any changes of this.  He will notify should he develop any ischemic symptoms    Rosetta Posner, MD FACS [facility] Office Tel [phone]  Note: Portions of this report may  have been transcribed using voice recognition software.  Every effort has been made to ensure accuracy; however, inadvertent computerized transcription errors may still be present.

## 2020-07-29 NOTE — Patient Instructions (Signed)
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

## 2020-08-20 ENCOUNTER — Encounter: Payer: Medicare HMO | Admitting: Physician Assistant

## 2020-08-20 ENCOUNTER — Telehealth: Payer: Self-pay | Admitting: Diagnostic Neuroimaging

## 2020-08-20 NOTE — Telephone Encounter (Signed)
07/30/2020  PT can be sch. NPR DanaC  Cartiod   I also sent a message to Butch Penny she is aware and will reach out to the patient to schedule.

## 2020-08-20 NOTE — Telephone Encounter (Signed)
Just an FYI   From [place] who schedule's Carotids   [3:04 PM] [place] Saver "I just got a message from [place] wife, I think Butch Penny just scheduled it today. it sounds like they didn't know anything about this Carotid ultrasound that was ordered, she had no idea who Dr. Leta Baptist was, and she wanted it canceled. I'm going to go ahead and do that. "

## 2020-08-20 NOTE — Telephone Encounter (Signed)
Called wife to discuss, and she stated husband is very reluctant to do any more tests. She will talk to PCP and see if he recommends it be done. If so she said her husband may consider. She  verbalized understanding, appreciation.

## 2020-08-26 ENCOUNTER — Encounter: Payer: Self-pay | Admitting: Physician Assistant

## 2020-08-26 ENCOUNTER — Ambulatory Visit (INDEPENDENT_AMBULATORY_CARE_PROVIDER_SITE_OTHER): Payer: Medicare HMO | Admitting: Physician Assistant

## 2020-08-26 ENCOUNTER — Other Ambulatory Visit: Payer: Self-pay

## 2020-08-26 DIAGNOSIS — D0439 Carcinoma in situ of skin of other parts of face: Secondary | ICD-10-CM | POA: Diagnosis not present

## 2020-08-26 DIAGNOSIS — D099 Carcinoma in situ, unspecified: Secondary | ICD-10-CM

## 2020-08-26 NOTE — Progress Notes (Signed)
   Follow-Up Visit   Subjective  Thomas Mosley is a 76 y.o. male who presents for the following: Procedure (Scc x1 right zygoma patient feels like its gone).   The following portions of the chart were reviewed this encounter and updated as appropriate:      Objective  Well appearing patient in no apparent distress; mood and affect are within normal limits.  A focused examination was performed including face. Relevant physical exam findings are noted in the Assessment and Plan.  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  Destruction of lesion Complexity: simple   Destruction method: electrodesiccation and curettage   Informed consent: discussed and consent obtained   Timeout:  patient name, date of birth, surgical site, and procedure verified Anesthesia: the lesion was anesthetized in a standard fashion   Anesthetic:  1% lidocaine w/ epinephrine 1-100,000 local infiltration Curettage performed in three different directions: Yes   Electrodesiccation performed over the curetted area: Yes   Curettage cycles:  3 Margin per side (cm):  0.1 Final wound size (cm):  3 Hemostasis achieved with:  ferric subsulfate and electrodesiccation Outcome: patient tolerated procedure well with no complications   Post-procedure details: wound care instructions given   Additional details:  Wound inoculated with 5% fluorouracil solution    I, Jacquelene Kopecky, PA-C, have reviewed all documentation's for this visit.  The documentation on 08/26/20 for the exam, diagnosis, procedures and orders are all accurate and complete.

## 2020-08-26 NOTE — Patient Instructions (Signed)

## 2020-08-29 ENCOUNTER — Ambulatory Visit (HOSPITAL_COMMUNITY): Payer: Medicare HMO

## 2021-01-19 ENCOUNTER — Other Ambulatory Visit: Payer: Self-pay | Admitting: Cardiovascular Disease

## 2021-01-19 ENCOUNTER — Other Ambulatory Visit: Payer: Self-pay | Admitting: Physician Assistant

## 2021-02-11 ENCOUNTER — Ambulatory Visit: Payer: Medicare HMO | Admitting: Physician Assistant

## 2021-05-18 ENCOUNTER — Other Ambulatory Visit: Payer: Self-pay | Admitting: Cardiovascular Disease

## 2021-05-21 ENCOUNTER — Ambulatory Visit: Payer: Medicare HMO | Admitting: Cardiovascular Disease

## 2021-06-18 ENCOUNTER — Other Ambulatory Visit: Payer: Self-pay

## 2021-06-18 MED ORDER — LOSARTAN POTASSIUM 100 MG PO TABS: 100.0000 mg | ORAL_TABLET | Freq: Every day | ORAL | 0 refills | Status: AC

## 2021-08-07 ENCOUNTER — Other Ambulatory Visit: Payer: Self-pay | Admitting: Cardiovascular Disease

## 2021-09-06 IMAGING — CT CT ANGIO AOBIFEM WO/W CM
2 of 9 series · 12 of 46 positions shown, 15 images · IV contrast (Omnipaque or Isovue)
Comparison: None.

CLINICAL DATA: 74-year-old male with pulseless right lower
extremity and discoloration of toes

EXAM:
CT ANGIOGRAPHY OF ABDOMINAL AORTA WITH ILIOFEMORAL RUNOFF
TECHNIQUE: Multidetector CT imaging of the abdomen, pelvis and lower
extremities was performed using the standard protocol during bolus
administration of intravenous contrast. Multiplanar CT image
reconstructions and MIPs were obtained to evaluate the vascular
anatomy.
CONTRAST:  100mL OMNIPAQUE IOHEXOL 350 MG/ML SOLN

[Series 4: arterial · axial · arterial · 0.90mm/px · z∈[-1649,-347]mm · 11 of 482 slices shown, 13 images]
[im 32/482  soft-tissue]
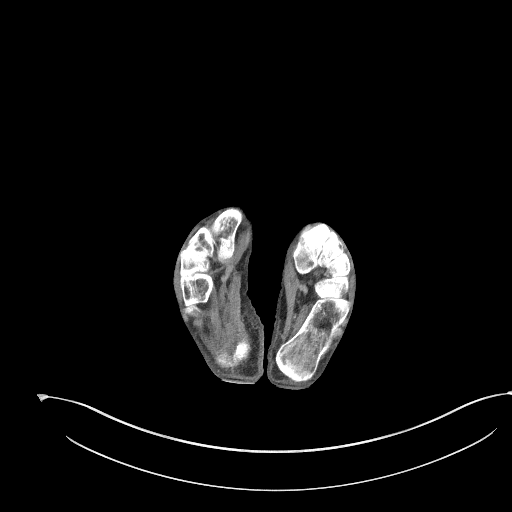
[im 32/482  bone]
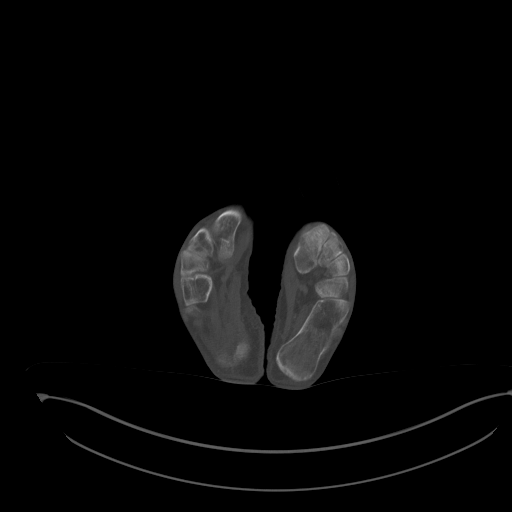
[im 94/482  soft-tissue]
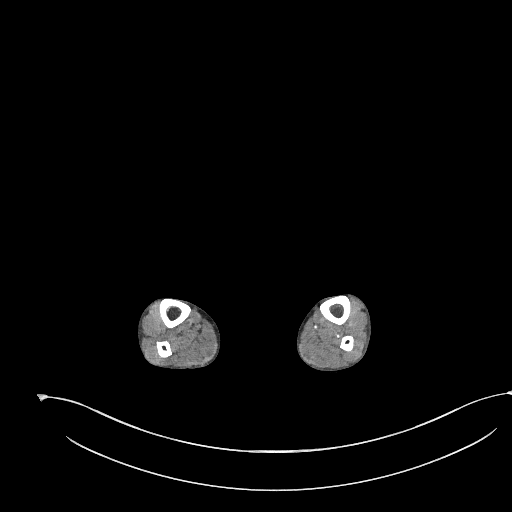
[im 156/482  soft-tissue]
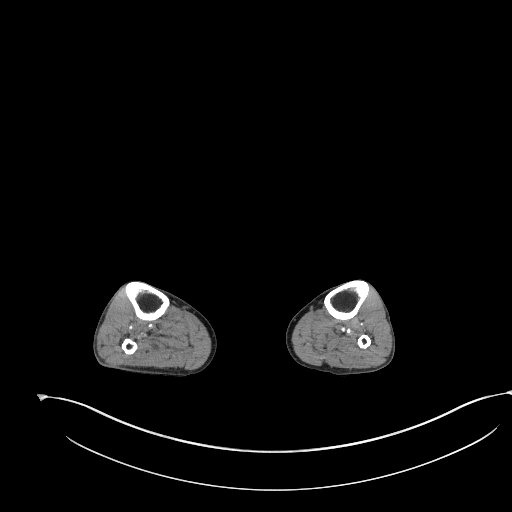
[im 218/482  soft-tissue]
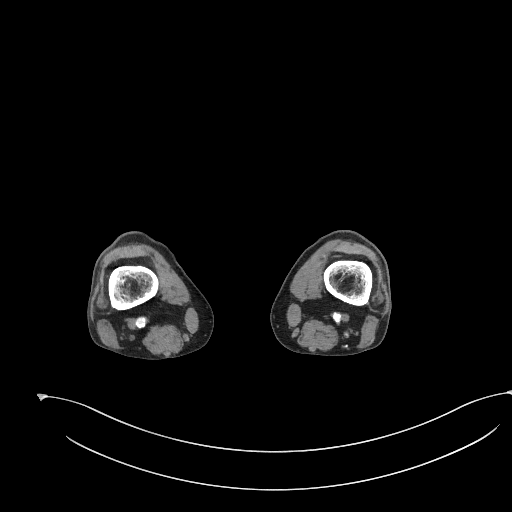
[im 264/482  soft-tissue]
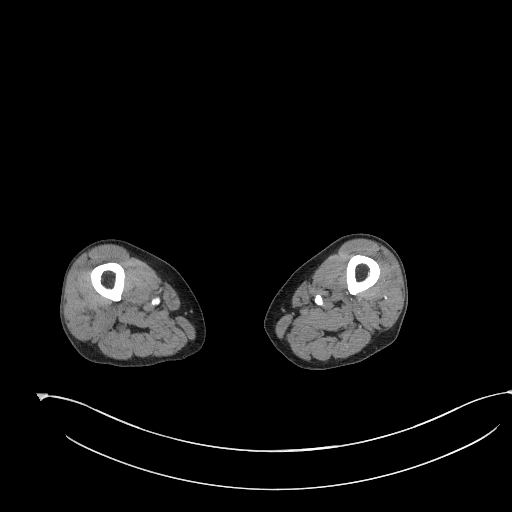
[im 326/482  soft-tissue]
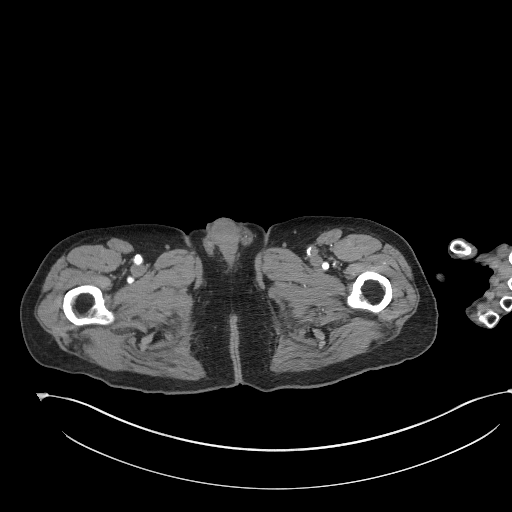
[im 388/482  soft-tissue]
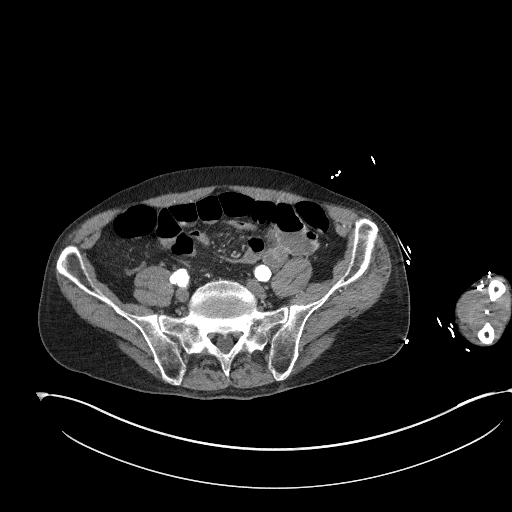
[im 419/482  lung]
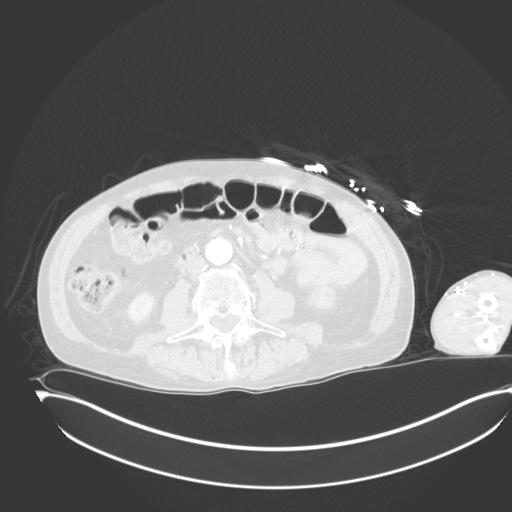
[im 435/482  lung]
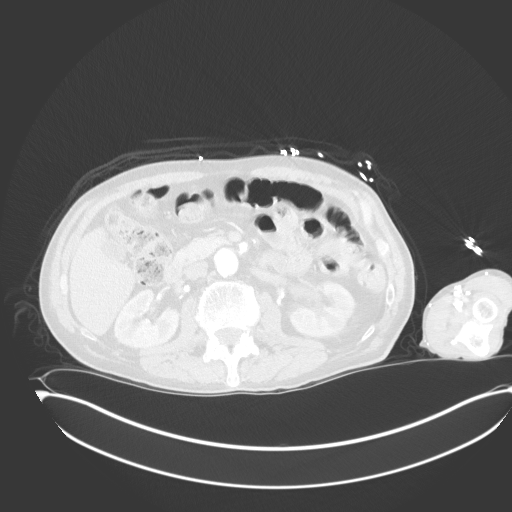
[im 450/482  soft-tissue]
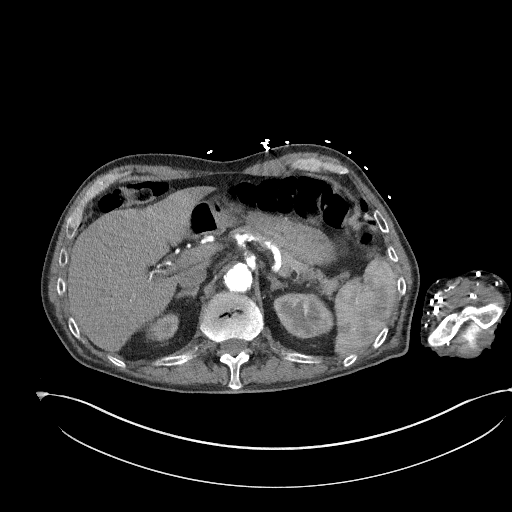
[im 450/482  lung]
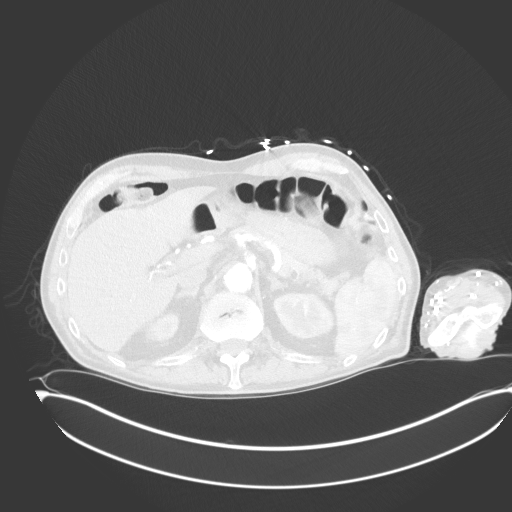
[im 466/482  lung]
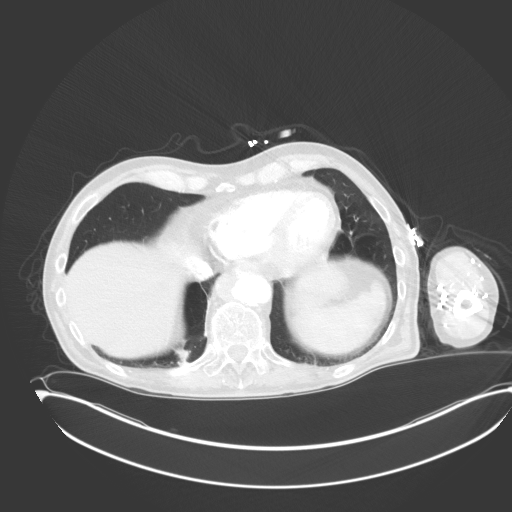

[Series 6: ap cor soft · coronal · 0.83mm/px · 1 of 181 slices shown, 2 images]
[im 91/181  soft-tissue]
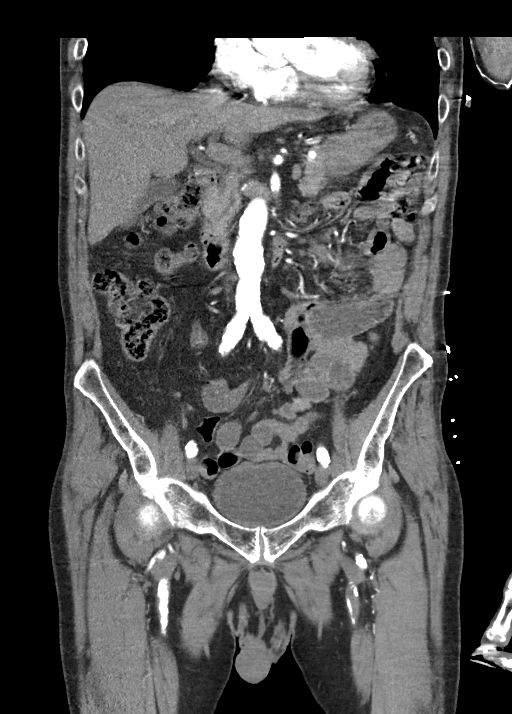
[im 91/181  bone]
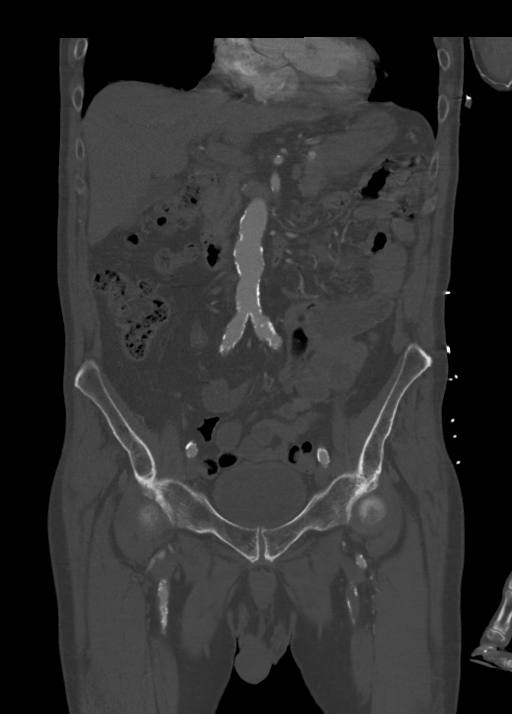

[12 of 46 positions shown; findings below may reference images not displayed]

FINDINGS: VASCULAR

Aorta: Normal caliber and patent throughout. Near circumferential
atherosclerotic calcifications without significant stenosis. About
the distal thoracic aorta, just proximal to the diaphragm is a
penetrating atheromatous ulcer with a patent neck measuring
approximately 1.6 cm. The ulceration projects laterally to the right
approximately 3.5 cm.

Celiac: Mild ostial stenosis secondary to atherosclerotic plaque.
Otherwise patent.

SMA: Mild ostial stenosis secondary to atherosclerotic plaque.
Otherwise patent.

Renals: At least mild ostial stenosis of the bilateral single renal
arteries which are otherwise patent without evidence of aneurysm,
dissection, vasculitis, or fibromuscular dysplasia.

IMA: Patent without evidence of aneurysm, dissection, vasculitis or
significant stenosis.

RIGHT Lower Extremity

Inflow: Patent common iliac artery with circumferential
atherosclerotic calcifications. Patent internal iliac artery with
focal fusiform aneurysmal change just proximal to the division or
bifurcation measuring up to 1.5 cm. The external iliac artery is
patent with circumferential coarse atherosclerotic calcifications.

Outflow: The common femoral artery and profundus are patent. The
superficial femoral artery is patent proximally with coarse near
circumferential atherosclerotic calcifications throughout, resulting
in at least mild multifocal stenoses. There is gradual tapering off
at the level of the popliteal artery which is occluded.

Runoff: Completely occluded. No evidence of inline flow to the right
foot.

LEFT Lower Extremity

Inflow: Patent common iliac artery with coarse circumferential
atherosclerotic calcifications. At least mild ostial stenosis of the
internal iliac ostium with partially thrombosed proximal internal
aneurysm measuring up to approximately 1.9 cm. The external iliac
artery is patent with coarse circumferential atherosclerotic
calcifications.

Outflow: The common femoral artery is patent with coarse
atherosclerotic calcifications. The profundal is patent. There is
chronic total occlusion of the proximal superficial femoral artery
with distal reconstitution at Hunter's canal. At least moderate
multifocal stenoses of the P1 segment of the popliteal artery
secondary to coarse atherosclerotic plaque.

Runoff: There is a high branching anterior tibial artery origin
which arises from the distal P1 segment of the popliteal artery. The
anterior tibial artery is patent proximally but tapers distally. The
tibioperoneal trunk is patent proximally is coarse atherosclerotic
plaques distally, extending into the proximal peroneal and posterior
tibial arteries which appear patent but diminutive. The peroneal
artery at the distal interosseous coarse. Inline flow to the foot is
primarily by the posterior tibial artery.

Veins: No obvious venous abnormality within the limitations of this
arterial phase study.

Review of the MIP images confirms the above findings.

NON-VASCULAR

Lower chest: Right basilar subsegmental atelectasis. Moderate
centrilobular emphysema. The heart is normal in size. No pericardial
effusion.

Hepatobiliary: Liver is normal in size, contour, and attenuation.
There are multifocal subcentimeter hypodensities, too small to
characterize by CT, though likely representing simple hepatic cysts.
The gallbladder is present unremarkable. No intra or extrahepatic
biliary ductal dilation.

Pancreas: Unremarkable. No pancreatic ductal dilatation or
surrounding inflammatory changes.

Spleen: Normal in size without focal abnormality.

Adrenals/Urinary Tract: Adrenal glands are unremarkable. Right upper
pole partially exophytic simple renal cyst measuring up to 1.5 cm.
Kidneys are otherwise normal, without renal calculi, focal lesion,
or hydronephrosis. Bladder is unremarkable.

Stomach/Bowel: Stomach is within normal limits. Appendix appears
normal. No evidence of bowel wall thickening, distention, or
inflammatory changes.

Lymphatic: No abdominopelvic lymphadenopathy.

Reproductive: Prostate is unremarkable.

Other: No abdominal wall hernia or abnormality. No abdominopelvic
ascites.

Musculoskeletal: No acute or significant osseous findings.
IMPRESSION: VASCULAR

1. Acute appearing occlusion of the right popliteal artery extending
into the runoff vessels, likely secondary to ruptured
atherosclerotic plaque. No evidence of inline flow to the right
foot.
2. Penetrating atheromatous ulcer of the distal thoracic aorta
measuring up to approximately 3 cm.
3. Chronic total occlusion of the left superficial femoral artery
from its origin to Hunter's canal.
4. Advanced atherosclerotic calcifications of the inflow vessels
with associated near symmetric bilateral internal iliac artery
aneurysms measuring up to 1.5 cm on the right 1.9 cm on the left.

NON-VASCULAR

No acute abdominopelvic abnormality.

Recommend emergent Vascular Surgery consultation.

## 2021-12-19 ENCOUNTER — Emergency Department (HOSPITAL_COMMUNITY): Payer: Medicare HMO

## 2021-12-19 ENCOUNTER — Encounter (HOSPITAL_COMMUNITY): Payer: Self-pay | Admitting: *Deleted

## 2021-12-19 ENCOUNTER — Inpatient Hospital Stay (HOSPITAL_COMMUNITY)
Admission: EM | Admit: 2021-12-19 | Discharge: 2021-12-21 | DRG: 177 | Disposition: A | Discharge status: Home / Self Care | Payer: Medicare HMO | Attending: Family Medicine | Admitting: Family Medicine

## 2021-12-19 ENCOUNTER — Other Ambulatory Visit: Payer: Self-pay

## 2021-12-19 DIAGNOSIS — J9601 Acute respiratory failure with hypoxia: Secondary | ICD-10-CM | POA: Diagnosis present

## 2021-12-19 DIAGNOSIS — I2510 Atherosclerotic heart disease of native coronary artery without angina pectoris: Secondary | ICD-10-CM | POA: Diagnosis present

## 2021-12-19 DIAGNOSIS — Z87891 Personal history of nicotine dependence: Secondary | ICD-10-CM

## 2021-12-19 DIAGNOSIS — Z8249 Family history of ischemic heart disease and other diseases of the circulatory system: Secondary | ICD-10-CM | POA: Diagnosis not present

## 2021-12-19 DIAGNOSIS — Z85828 Personal history of other malignant neoplasm of skin: Secondary | ICD-10-CM | POA: Diagnosis not present

## 2021-12-19 DIAGNOSIS — J189 Pneumonia, unspecified organism: Secondary | ICD-10-CM

## 2021-12-19 DIAGNOSIS — U071 COVID-19: Secondary | ICD-10-CM | POA: Diagnosis present

## 2021-12-19 DIAGNOSIS — Z7982 Long term (current) use of aspirin: Secondary | ICD-10-CM | POA: Diagnosis not present

## 2021-12-19 DIAGNOSIS — I252 Old myocardial infarction: Secondary | ICD-10-CM

## 2021-12-19 DIAGNOSIS — Z7902 Long term (current) use of antithrombotics/antiplatelets: Secondary | ICD-10-CM | POA: Diagnosis not present

## 2021-12-19 DIAGNOSIS — E860 Dehydration: Secondary | ICD-10-CM | POA: Diagnosis present

## 2021-12-19 DIAGNOSIS — Z8673 Personal history of transient ischemic attack (TIA), and cerebral infarction without residual deficits: Secondary | ICD-10-CM

## 2021-12-19 DIAGNOSIS — J449 Chronic obstructive pulmonary disease, unspecified: Secondary | ICD-10-CM

## 2021-12-19 DIAGNOSIS — I10 Essential (primary) hypertension: Secondary | ICD-10-CM | POA: Diagnosis present

## 2021-12-19 DIAGNOSIS — J441 Chronic obstructive pulmonary disease with (acute) exacerbation: Secondary | ICD-10-CM | POA: Diagnosis present

## 2021-12-19 DIAGNOSIS — R296 Repeated falls: Secondary | ICD-10-CM | POA: Diagnosis present

## 2021-12-19 DIAGNOSIS — Z955 Presence of coronary angioplasty implant and graft: Secondary | ICD-10-CM

## 2021-12-19 DIAGNOSIS — Z823 Family history of stroke: Secondary | ICD-10-CM

## 2021-12-19 DIAGNOSIS — Z7951 Long term (current) use of inhaled steroids: Secondary | ICD-10-CM

## 2021-12-19 DIAGNOSIS — J440 Chronic obstructive pulmonary disease with acute lower respiratory infection: Secondary | ICD-10-CM | POA: Diagnosis present

## 2021-12-19 DIAGNOSIS — N179 Acute kidney failure, unspecified: Secondary | ICD-10-CM | POA: Diagnosis present

## 2021-12-19 DIAGNOSIS — E876 Hypokalemia: Secondary | ICD-10-CM | POA: Diagnosis present

## 2021-12-19 DIAGNOSIS — E7800 Pure hypercholesterolemia, unspecified: Secondary | ICD-10-CM | POA: Diagnosis present

## 2021-12-19 DIAGNOSIS — Z79899 Other long term (current) drug therapy: Secondary | ICD-10-CM

## 2021-12-19 DIAGNOSIS — R0602 Shortness of breath: Secondary | ICD-10-CM | POA: Diagnosis present

## 2021-12-19 DIAGNOSIS — J1282 Pneumonia due to coronavirus disease 2019: Secondary | ICD-10-CM | POA: Diagnosis present

## 2021-12-19 DIAGNOSIS — R627 Adult failure to thrive: Secondary | ICD-10-CM | POA: Diagnosis present

## 2021-12-19 LAB — BLOOD GAS, VENOUS
Acid-Base Excess: 7 mmol/L — ABNORMAL HIGH (ref 0.0–2.0)
Bicarbonate: 34.5 mmol/L — ABNORMAL HIGH (ref 20.0–28.0)
Drawn by: 27160
O2 Saturation: 17.1 %
Patient temperature: 36.4
pCO2, Ven: 59 mmHg (ref 44–60)
pH, Ven: 7.37 (ref 7.25–7.43)
pO2, Ven: <31 mmHg — CL (ref 32–45)

## 2021-12-19 LAB — COMPREHENSIVE METABOLIC PANEL
ALT: 32 U/L (ref 0–44)
AST: 30 U/L (ref 15–41)
Albumin: 3 g/dL — ABNORMAL LOW (ref 3.5–5.0)
Alkaline Phosphatase: 84 U/L (ref 38–126)
Anion gap: 11 (ref 5–15)
BUN: 28 mg/dL — ABNORMAL HIGH (ref 8–23)
CO2: 29 mmol/L (ref 22–32)
Calcium: 8.3 mg/dL — ABNORMAL LOW (ref 8.9–10.3)
Chloride: 98 mmol/L (ref 98–111)
Creatinine, Ser: 1.56 mg/dL — ABNORMAL HIGH (ref 0.61–1.24)
GFR, Estimated: 45 mL/min — ABNORMAL LOW (ref >60)
Glucose, Bld: 111 mg/dL — ABNORMAL HIGH (ref 70–99)
Potassium: 2.8 mmol/L — ABNORMAL LOW (ref 3.5–5.1)
Sodium: 138 mmol/L (ref 135–145)
Total Bilirubin: 0.7 mg/dL (ref 0.3–1.2)
Total Protein: 7.5 g/dL (ref 6.5–8.1)

## 2021-12-19 LAB — CBC WITH DIFFERENTIAL/PLATELET
Abs Immature Granulocytes: 0.07 10*3/uL (ref 0.00–0.07)
Basophils Absolute: 0 10*3/uL (ref 0.0–0.1)
Basophils Relative: 1 %
Eosinophils Absolute: 0.1 10*3/uL (ref 0.0–0.5)
Eosinophils Relative: 1 %
HCT: 40.5 % (ref 39.0–52.0)
Hemoglobin: 13.4 g/dL (ref 13.0–17.0)
Immature Granulocytes: 1 %
Lymphocytes Relative: 6 %
Lymphs Abs: 0.5 10*3/uL — ABNORMAL LOW (ref 0.7–4.0)
MCH: 30.2 pg (ref 26.0–34.0)
MCHC: 33.1 g/dL (ref 30.0–36.0)
MCV: 91.2 fL (ref 80.0–100.0)
Monocytes Absolute: 1.3 10*3/uL — ABNORMAL HIGH (ref 0.1–1.0)
Monocytes Relative: 16 %
Neutro Abs: 6.3 10*3/uL (ref 1.7–7.7)
Neutrophils Relative %: 75 %
Platelets: 182 10*3/uL (ref 150–400)
RBC: 4.44 MIL/uL (ref 4.22–5.81)
RDW: 14.9 % (ref 11.5–15.5)
WBC: 8.3 10*3/uL (ref 4.0–10.5)
nRBC: 0 % (ref 0.0–0.2)

## 2021-12-19 LAB — LACTIC ACID, PLASMA
Lactic Acid, Venous: 1.1 mmol/L (ref 0.5–1.9)
Lactic Acid, Venous: 1.4 mmol/L (ref 0.5–1.9)

## 2021-12-19 LAB — BRAIN NATRIURETIC PEPTIDE: B Natriuretic Peptide: 111 pg/mL — ABNORMAL HIGH (ref 0.0–100.0)

## 2021-12-19 LAB — RESP PANEL BY RT-PCR (FLU A&B, COVID) ARPGX2
Influenza A by PCR: NEGATIVE
Influenza B by PCR: NEGATIVE
SARS Coronavirus 2 by RT PCR: POSITIVE — AB

## 2021-12-19 LAB — TROPONIN I (HIGH SENSITIVITY)
Troponin I (High Sensitivity): 6 ng/L (ref <18)
Troponin I (High Sensitivity): 5 ng/L (ref <18)

## 2021-12-19 LAB — MRSA NEXT GEN BY PCR, NASAL: MRSA by PCR Next Gen: NOT DETECTED

## 2021-12-19 MED ORDER — METOPROLOL SUCCINATE ER 50 MG PO TB24
100.0000 mg | ORAL_TABLET | Freq: Every day | ORAL | Status: DC
  Administered 2021-12-20 – 2021-12-21 (×2): 100 mg via ORAL
  Filled 2021-12-19 (×2): qty 2

## 2021-12-19 MED ORDER — SODIUM CHLORIDE 0.9 % IV BOLUS
1000.0000 mL | Freq: Once | INTRAVENOUS | Status: AC
  Administered 2021-12-19: 1000 mL via INTRAVENOUS

## 2021-12-19 MED ORDER — POTASSIUM CHLORIDE 10 MEQ/100ML IV SOLN
10.0000 meq | INTRAVENOUS | Status: AC
  Administered 2021-12-19 (×2): 10 meq via INTRAVENOUS
  Filled 2021-12-19 (×3): qty 100

## 2021-12-19 MED ORDER — SODIUM CHLORIDE 0.9 % IV SOLN
500.0000 mg | INTRAVENOUS | Status: DC
  Administered 2021-12-19 – 2021-12-20 (×2): 500 mg via INTRAVENOUS
  Filled 2021-12-19 (×2): qty 5

## 2021-12-19 MED ORDER — ONDANSETRON HCL 4 MG PO TABS: 4.0000 mg | ORAL_TABLET | Freq: Four times a day (QID) | ORAL | Status: DC | PRN

## 2021-12-19 MED ORDER — ASPIRIN 81 MG PO TBEC
81.0000 mg | DELAYED_RELEASE_TABLET | Freq: Every day | ORAL | Status: DC
  Administered 2021-12-19 – 2021-12-21 (×3): 81 mg via ORAL
  Filled 2021-12-19 (×3): qty 1

## 2021-12-19 MED ORDER — SODIUM CHLORIDE 0.9 % IV SOLN
2.0000 g | Freq: Two times a day (BID) | INTRAVENOUS | Status: DC
  Administered 2021-12-19: 2 g via INTRAVENOUS
  Filled 2021-12-19: qty 12.5

## 2021-12-19 MED ORDER — UMECLIDINIUM BROMIDE 62.5 MCG/ACT IN AEPB
1.0000 | INHALATION_SPRAY | Freq: Every day | RESPIRATORY_TRACT | Status: DC
  Administered 2021-12-20 – 2021-12-21 (×2): 1 via RESPIRATORY_TRACT
  Filled 2021-12-19: qty 7

## 2021-12-19 MED ORDER — ENOXAPARIN SODIUM 40 MG/0.4ML IJ SOSY
40.0000 mg | PREFILLED_SYRINGE | INTRAMUSCULAR | Status: DC
  Administered 2021-12-19 – 2021-12-20 (×2): 40 mg via SUBCUTANEOUS
  Filled 2021-12-19 (×2): qty 0.4

## 2021-12-19 MED ORDER — CHLORHEXIDINE GLUCONATE CLOTH 2 % EX PADS
6.0000 | MEDICATED_PAD | Freq: Every day | CUTANEOUS | Status: DC
  Administered 2021-12-20 – 2021-12-21 (×2): 6 via TOPICAL

## 2021-12-19 MED ORDER — IPRATROPIUM-ALBUTEROL 0.5-2.5 (3) MG/3ML IN SOLN
3.0000 mL | Freq: Four times a day (QID) | RESPIRATORY_TRACT | Status: DC
  Administered 2021-12-19 – 2021-12-20 (×3): 3 mL via RESPIRATORY_TRACT
  Filled 2021-12-19 (×3): qty 3

## 2021-12-19 MED ORDER — FLUTICASONE FUROATE-VILANTEROL 200-25 MCG/ACT IN AEPB
1.0000 | INHALATION_SPRAY | Freq: Every day | RESPIRATORY_TRACT | Status: DC
  Administered 2021-12-20 – 2021-12-21 (×2): 1 via RESPIRATORY_TRACT
  Filled 2021-12-19: qty 28

## 2021-12-19 MED ORDER — SODIUM CHLORIDE 0.9 % IV SOLN
INTRAVENOUS | Status: DC
Start: 2021-12-19 — End: 2021-12-21

## 2021-12-19 MED ORDER — ATORVASTATIN CALCIUM 40 MG PO TABS: 80.0000 mg | ORAL_TABLET | Freq: Every day | ORAL | Status: DC

## 2021-12-19 MED ORDER — POTASSIUM CHLORIDE CRYS ER 20 MEQ PO TBCR
40.0000 meq | EXTENDED_RELEASE_TABLET | Freq: Two times a day (BID) | ORAL | Status: DC
  Administered 2021-12-19 – 2021-12-21 (×4): 40 meq via ORAL
  Filled 2021-12-19 (×4): qty 2

## 2021-12-19 MED ORDER — METHYLPREDNISOLONE SODIUM SUCC 125 MG IJ SOLR
60.0000 mg | Freq: Two times a day (BID) | INTRAMUSCULAR | Status: AC
  Administered 2021-12-19 – 2021-12-20 (×2): 60 mg via INTRAVENOUS
  Filled 2021-12-19 (×2): qty 2

## 2021-12-19 MED ORDER — ACETAMINOPHEN 325 MG PO TABS
650.0000 mg | ORAL_TABLET | Freq: Four times a day (QID) | ORAL | Status: DC | PRN
  Administered 2021-12-19: 650 mg via ORAL
  Filled 2021-12-19: qty 2

## 2021-12-19 MED ORDER — CLOPIDOGREL BISULFATE 75 MG PO TABS
75.0000 mg | ORAL_TABLET | Freq: Every day | ORAL | Status: DC
  Administered 2021-12-19 – 2021-12-21 (×3): 75 mg via ORAL
  Filled 2021-12-19 (×3): qty 1

## 2021-12-19 MED ORDER — SODIUM CHLORIDE 0.9 % IV SOLN: 500.0000 mg | INTRAVENOUS | Status: DC

## 2021-12-19 MED ORDER — POTASSIUM CHLORIDE 10 MEQ/100ML IV SOLN
10.0000 meq | Freq: Once | INTRAVENOUS | Status: AC
Start: 2021-12-19 — End: 2021-12-19
  Administered 2021-12-19: 10 meq via INTRAVENOUS
  Filled 2021-12-19: qty 100

## 2021-12-19 MED ORDER — IPRATROPIUM-ALBUTEROL 0.5-2.5 (3) MG/3ML IN SOLN
3.0000 mL | RESPIRATORY_TRACT | Status: AC
  Administered 2021-12-19 (×3): 3 mL via RESPIRATORY_TRACT
  Filled 2021-12-19: qty 9

## 2021-12-19 MED ORDER — ONDANSETRON HCL 4 MG/2ML IJ SOLN: 4.0000 mg | Freq: Four times a day (QID) | INTRAMUSCULAR | Status: DC | PRN

## 2021-12-19 MED ORDER — LORATADINE 10 MG PO TABS
10.0000 mg | ORAL_TABLET | Freq: Every day | ORAL | Status: DC
  Administered 2021-12-20 – 2021-12-21 (×2): 10 mg via ORAL
  Filled 2021-12-19 (×3): qty 1

## 2021-12-19 MED ORDER — ENSURE ENLIVE PO LIQD
237.0000 mL | Freq: Two times a day (BID) | ORAL | Status: DC
  Administered 2021-12-20 – 2021-12-21 (×3): 237 mL via ORAL
  Filled 2021-12-19 (×2): qty 237

## 2021-12-19 MED ORDER — PREDNISONE 20 MG PO TABS: 40.0000 mg | ORAL_TABLET | Freq: Every day | ORAL | Status: DC

## 2021-12-19 MED ORDER — SODIUM CHLORIDE 0.9 % IV SOLN
2.0000 g | INTRAVENOUS | Status: DC
  Administered 2021-12-19 – 2021-12-20 (×2): 2 g via INTRAVENOUS
  Filled 2021-12-19 (×2): qty 20

## 2021-12-19 MED ORDER — PANTOPRAZOLE SODIUM 40 MG PO TBEC
40.0000 mg | DELAYED_RELEASE_TABLET | Freq: Every day | ORAL | Status: DC
  Administered 2021-12-20 – 2021-12-21 (×2): 40 mg via ORAL
  Filled 2021-12-19 (×3): qty 1

## 2021-12-19 MED ORDER — METHYLPREDNISOLONE SODIUM SUCC 125 MG IJ SOLR
60.0000 mg | Freq: Once | INTRAMUSCULAR | Status: AC
Start: 2021-12-19 — End: 2021-12-19
  Administered 2021-12-19: 60 mg via INTRAVENOUS
  Filled 2021-12-19: qty 2

## 2021-12-19 MED ORDER — ACETAMINOPHEN 650 MG RE SUPP: 650.0000 mg | Freq: Four times a day (QID) | RECTAL | Status: DC | PRN

## 2021-12-19 MED ORDER — ALBUTEROL SULFATE (2.5 MG/3ML) 0.083% IN NEBU
2.5000 mg | INHALATION_SOLUTION | RESPIRATORY_TRACT | Status: DC | PRN
Start: 2021-12-19 — End: 2021-12-20

## 2021-12-19 NOTE — ED Triage Notes (Signed)
Pt sent here from Dr. Juel Burrow office for hypoxia 81% on RA in the office.  Pt with is smoker with hx of COPD. Reported pt with unsteady gait and not eating and has lost 10-15 lbs. Over past few months.

## 2021-12-19 NOTE — ED Provider Notes (Signed)
[facility] Provider Note   CSN: [id number] Arrival date & time: 12/19/21  1127     History {Add pertinent medical, surgical, social history, OB history to HPI:1} No chief complaint on file.   Thomas Kinsley RITCHEY is a 76 y.o. male.  Really severe SOB for the last few weeks. History obtained form patient's wife Hurts all over "Everything's giving out." Not been himself. Generalized weakness.  Patient has fallen several times, unwitnessed by the wife. She does not think he hit his head but stated once that he passed out - unclear to wife if he did or not.  Does not take aspirin. Has been taking 3-5 BC powders per day.  Lost 10 pounds in the last month approximately. Denies nausea/vomiting. Coughs up a lot of phlegm, worse for a few weeks, spits into a cup, yellow. Unclear if there's been a change in color/consistency. Denies fevers/chills. Patient is nonambulatory. Hasn't smoked in several days but typically smokes 0.5-1 ppd.    HPI     Home Medications Prior to Admission medications   Medication Sig Start Date End Date Taking? Authorizing Provider  albuterol (VENTOLIN HFA) 108 (90 Base) MCG/ACT inhaler Inhale 2 puffs into the lungs every 6 (six) hours as needed. 05/24/20   [provider]  aspirin 81 MG EC tablet Take 1 tablet (81 mg total) by mouth daily. 01/16/20   Richardson Dopp T, PA-C  atorvastatin (LIPITOR) 80 MG tablet TAKE 1 TABLET BY MOUTH EVERY DAY 01/21/21   Sherren Mocha, MD  hydrochlorothiazide (HYDRODIURIL) 25 MG tablet TAKE 1/2 TABLET BY MOUTH EVERY DAY 01/20/21   Richardson Dopp T, PA-C  loratadine (CLARITIN) 10 MG tablet Take 10 mg by mouth daily. 05/24/20   [provider]  losartan (COZAAR) 100 MG tablet Take 1 tablet (100 mg total) by mouth daily. Please make overdue appt with Dr. Burt Knack before anymore refills. Thank you 1st attempt 06/18/21   Sherren Mocha, MD  metoprolol succinate (TOPROL-XL) 100 MG 24 hr tablet TAKE 1 TABLET (100  MG TOTAL) BY MOUTH DAILY. Take with or immediately following a meal. Please call and make appt with Dr. Burt Knack for future refills [phone]. Thank you. 1st attempt 05/19/21   Sherren Mocha, MD  nitroGLYCERIN (NITROSTAT) 0.4 MG SL tablet Place 1 tablet (0.4 mg total) under the tongue every 5 (five) minutes x 3 doses as needed for chest pain. 10/04/15   Cheryln Manly, NP  pantoprazole (PROTONIX) 40 MG tablet Take 40 mg by mouth daily. 09/30/15   [provider]  umeclidinium-vilanterol (ANORO ELLIPTA) 62.5-25 MCG/INH AEPB Inhale 1 puff into the lungs daily. 06/11/19   Emerson Monte, FNP      Allergies    Patient has no known allergies.    Review of Systems   Review of Systems  Physical Exam Updated Vital Signs BP (!) 96/52   Pulse 82   Temp (!) 97.5 F (36.4 C) (Oral)   Resp (!) 22   Ht '6\' 2"'$  (1.88 m)   Wt 56.7 kg   SpO2 92%   BMI 16.05 kg/m  Physical Exam General: Normal appearing ***male/male, lying in bed.  HEENT: PERRLA, Sclera anicteric, MMM, trachea midline. Cardiology: RRR, no murmurs/rubs/gallops. BL radial and DP pulses equal bilaterally.  Resp: Intermittent deep cough. CTAB, no wheezes, rhonchi, crackles.  Abd: Soft, non-tender, non-distended. No rebound tenderness or guarding.  GU: Deferred. MSK: No peripheral edema or signs of trauma. Extremities without deformity or TTP. No cyanosis or clubbing.  Skin: warm, dry. No rashes or lesions. Neuro: A&Ox4, CNs II-XII grossly intact. MAEs. Sensation grossly intact.  Psych: Normal mood and affect.   ED Results / Procedures / Treatments   Labs (all labs ordered are listed, but only abnormal results are displayed) Labs Reviewed - No data to display  EKG None  Radiology No results found.  Procedures Procedures  {Document cardiac monitor, telemetry assessment procedure when appropriate:1}  Medications Ordered in ED Medications  sodium chloride 0.9 % bolus 1,000 mL (has no administration in time  range)    ED Course/ Medical Decision Making/ A&P                           Medical Decision Making Amount and/or Complexity of Data Reviewed Labs: ordered. Radiology: ordered.  Risk Prescription drug management.   ***    {Document critical care time when appropriate:1} {Document review of labs and clinical decision tools ie heart score, Chads2Vasc2 etc:1}  {Document your independent review of radiology images, and any outside records:1} {Document your discussion with family members, caretakers, and with consultants:1} {Document social determinants of health affecting pt's care:1} {Document your decision making why or why not admission, treatments were needed:1} Final Clinical Impression(s) / ED Diagnoses Final diagnoses:  None    Rx / DC Orders ED Discharge Orders     None

## 2021-12-19 NOTE — H&P (Signed)
History and Physical    Patient: Thomas Mosley:[id number] DOB: [birth year] DOA: 12/19/2021 DOS: the patient was seen and examined on 12/19/2021 PCP: Celene Squibb, MD  Patient coming from: Home  Chief Complaint: No chief complaint on file.  HPI: Thomas Mosley is a 76 y.o. male with medical history significant of COPD, coronary artery disease with history of NSTEMI with multiple stents and normal EF, hypertension, hyperlipidemia.  Patient is fairly weak and unable to provide much history.  The history is obtained by the patient's wife.  Patient has had gradual decline over the past several weeks.  He has been in bed for most of the day and initially only getting up to use the bathroom or going outside to smoke.  Over the last 4 to 5 days, he has not even gotten up to go smoke and has used the bathroom very little.  His appetite has declined and has not had much oral food or water intake.  He has lost 10 pounds over the past 3 months.  No noticeable fevers or chills, night sweats.  He does get winded very easily.   Review of Systems: As mentioned in the history of present illness. All other systems reviewed and are negative. Past Medical History:  Diagnosis Date   COPD    not on home o2   Coronary artery disease    NSTEMI 6/17 >> LHC: pLAD 50, pLCx 100 (L-L collats), oRCA 90, RPLB1 60 >> PCI:  atherectomy and DES to RCA // Echo 6/17: EF 55-60, Gr 1 DD, mild AI, mild MR   Hypercholesterolemia    Hypertension    SCC (squamous cell carcinoma) 06/12/2011   left ear rim- tx p bx   SCC (squamous cell carcinoma) 02/20/2015   left inner nose- [id number]   Squamous cell carcinoma of skin 05/14/2020   in situ- right zygomatic area   Stroke ([place]) 06/2020   Tobacco dependence    Past Surgical History:  Procedure Laterality Date   CARDIAC CATHETERIZATION N/A 10/02/2015   Procedure: Left Heart Cath and Coronary Angiography;  Surgeon: Sherren Mocha, MD;  Location: [facility];  Service:  Cardiovascular;  Laterality: N/A;   CARDIAC CATHETERIZATION N/A 10/02/2015   Procedure: Intravascular Ultrasound/IVUS;  Surgeon: Sherren Mocha, MD;  Location: [facility];  Service: Cardiovascular;  Laterality: N/A;   CARDIAC CATHETERIZATION N/A 10/03/2015   Procedure: Coronary Stent Intervention Rotablator;  Surgeon: Sherren Mocha, MD;  Location: [facility];  Service: Cardiovascular;  Laterality: N/A;   CATARACT EXTRACTION W/PHACO  02/02/2011   Procedure: CATARACT EXTRACTION PHACO AND INTRAOCULAR LENS PLACEMENT (IOC);  Surgeon: Tonny Branch;  Location: [facility];  Service: Ophthalmology;  Laterality: Left;  CDE 12.33   COLONOSCOPY  02/13/2004   NGE:[id number] polyp at 30 cm resected/normal rectum. 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    COLONOSCOPY N/A 11/23/2012   Procedure: COLONOSCOPY;  Surgeon: Daneil Dolin, MD;  Location: [facility];  Service: Endoscopy;  Laterality: N/A;  12:15-rescheduled to 11:00am Thomas Mosley notified pt   Social History:  reports that he quit smoking 5 days ago. His smoking use included cigarettes. He has a 10.00 pack-year smoking history. He has never used smokeless tobacco. He reports current alcohol use of about 1.0 standard drink of alcohol per week. He reports that he does not use drugs.  No Known Allergies  Family History  Problem Relation Age of  Onset   Heart attack Mother    Stroke Father    Heart attack Brother    Heart attack Brother 34   Anesthesia problems Neg Hx    Hypotension Neg Hx    Malignant hyperthermia Neg Hx    Pseudochol deficiency Neg Hx    Colon cancer Neg Hx     Prior to Admission medications   Medication Sig Start Date End Date Taking? Authorizing Provider  albuterol (VENTOLIN HFA) 108 (90 Base) MCG/ACT inhaler Inhale 2 puffs into the lungs every 6 (six) hours as needed for wheezing or shortness of breath. 05/24/20  Yes [provider]  Aspirin-Salicylamide-Caffeine (BC HEADACHE POWDER PO) Take 2-3 Packages by mouth daily.   Yes [provider]  atorvastatin (LIPITOR) 80 MG tablet TAKE 1 TABLET BY MOUTH EVERY DAY 01/21/21  Yes Sherren Mocha, MD  clopidogrel (PLAVIX) 75 MG tablet Take 75 mg by mouth daily. 12/04/21  Yes [provider]  hydrochlorothiazide (HYDRODIURIL) 25 MG tablet TAKE 1/2 TABLET BY MOUTH EVERY DAY 01/20/21  Yes Weaver, Scott T, PA-C  losartan (COZAAR) 100 MG tablet Take 1 tablet (100 mg total) by mouth daily. Please make overdue appt with Dr. Burt Knack before anymore refills. Thank you 1st attempt 06/18/21  Yes Sherren Mocha, MD  metoprolol succinate (TOPROL-XL) 100 MG 24 hr tablet TAKE 1 TABLET (100 MG TOTAL) BY MOUTH DAILY. Take with or immediately following a meal. Please call and make appt with Dr. Burt Knack for future refills [phone]. Thank you. 1st attempt 05/19/21  Yes Sherren Mocha, MD  nitroGLYCERIN (NITROSTAT) 0.4 MG SL tablet Place 1 tablet (0.4 mg total) under the tongue every 5 (five) minutes x 3 doses as needed for chest pain. 10/04/15  Yes Reino Bellis B, NP  pantoprazole (PROTONIX) 40 MG tablet Take 40 mg by mouth daily. 09/30/15  Yes [provider]  Thomas Mosley 200-62.5-25 MCG/ACT AEPB Inhale 1 puff into the lungs daily. 12/02/21  Yes [provider]  umeclidinium-vilanterol (ANORO ELLIPTA) 62.5-25 MCG/INH AEPB Inhale 1 puff into the lungs daily. 06/11/19  Yes Avegno, Darrelyn Hillock, FNP  aspirin 81 MG EC tablet Take 1 tablet (81 mg total) by mouth daily. Patient not taking: Reported on 12/19/2021 01/16/20   Richardson Dopp T, PA-C  loratadine (CLARITIN) 10 MG tablet Take 10 mg by mouth daily. Patient not taking: Reported on 12/19/2021 05/24/20   [provider]    Physical Exam: Vitals:   12/19/21 1300 12/19/21 1406 12/19/21 1414 12/19/21 1500  BP: (!) 96/52  (!) 84/41 90/61  Pulse:   71   Resp: (!) 22  (!) 23 (!) 21  Temp:       TempSrc:      SpO2:  100% 96%   Weight:      Height:       General: Elderly male who is very thin in appearance.. Awake and alert and oriented x3. No acute cardiopulmonary distress.  HEENT: Normocephalic atraumatic.  Right and left ears normal in appearance.  Pupils equal, round, reactive to light. Extraocular muscles are intact. Sclerae anicteric and noninjected.  Moist mucosal membranes. No mucosal lesions.  Neck: Neck supple without lymphadenopathy. No carotid bruits. No masses palpated.  Cardiovascular: Regular rate with normal S1-S2 sounds. No murmurs, rubs, gallops auscultated. No JVD.  Respiratory: Decreased air movement.  Rales in the right upper lobe. no accessory muscle use. Abdomen: Soft, nontender, nondistended. Active bowel sounds. No masses or hepatosplenomegaly  Skin: No rashes, lesions, or ulcerations.  Dry, warm to touch. 2+ dorsalis pedis and radial pulses. Musculoskeletal: No calf or leg pain. All major joints not erythematous nontender.  No upper or lower joint deformation.  Good ROM.  No contractures  Psychiatric: Intact judgment and insight. Pleasant and cooperative. Neurologic: No focal neurological deficits. Strength is 5/5 and symmetric in upper and lower extremities.  Cranial nerves II through XII are grossly intact.  Data Reviewed: Results for orders placed or performed during the hospital encounter of 12/19/21 (from the past 24 hour(s))  CBC with Differential     Status: Abnormal   Collection Time: 12/19/21  1:48 PM  Result Value Ref Range   WBC 8.3 4.0 - 10.5 K/uL   RBC 4.44 4.22 - 5.81 MIL/uL   Hemoglobin 13.4 13.0 - 17.0 g/dL   HCT 40.5 39.0 - 52.0 %   MCV 91.2 80.0 - 100.0 fL   MCH 30.2 26.0 - 34.0 pg   MCHC 33.1 30.0 - 36.0 g/dL   RDW 14.9 11.5 - 15.5 %   Platelets 182 150 - 400 K/uL   nRBC 0.0 0.0 - 0.2 %   Neutrophils Relative % 75 %   Neutro Abs 6.3 1.7 - 7.7 K/uL   Lymphocytes Relative 6 %   Lymphs Abs 0.5 (L) 0.7 - 4.0 K/uL   Monocytes  Relative 16 %   Monocytes Absolute 1.3 (H) 0.1 - 1.0 K/uL   Eosinophils Relative 1 %   Eosinophils Absolute 0.1 0.0 - 0.5 K/uL   Basophils Relative 1 %   Basophils Absolute 0.0 0.0 - 0.1 K/uL   Immature Granulocytes 1 %   Abs Immature Granulocytes 0.07 0.00 - 0.07 K/uL  Comprehensive metabolic panel     Status: Abnormal   Collection Time: 12/19/21  1:48 PM  Result Value Ref Range   Sodium 138 135 - 145 mmol/L   Potassium 2.8 (L) 3.5 - 5.1 mmol/L   Chloride 98 98 - 111 mmol/L   CO2 29 22 - 32 mmol/L   Glucose, Bld 111 (H) 70 - 99 mg/dL   BUN 28 (H) 8 - 23 mg/dL   Creatinine, Ser 1.56 (H) 0.61 - 1.24 mg/dL   Calcium 8.3 (L) 8.9 - 10.3 mg/dL   Total Protein 7.5 6.5 - 8.1 g/dL   Albumin 3.0 (L) 3.5 - 5.0 g/dL   AST 30 15 - 41 U/L   ALT 32 0 - 44 U/L   Alkaline Phosphatase 84 38 - 126 U/L   Total Bilirubin 0.7 0.3 - 1.2 mg/dL   GFR, Estimated 45 (L) >60 mL/min   Anion gap 11 5 - 15  Brain natriuretic peptide     Status: Abnormal   Collection Time: 12/19/21  1:48 PM  Result Value Ref Range   B Natriuretic Peptide 111.0 (H) 0.0 - 100.0 pg/mL  Blood gas, venous (at [facility] and [facility], not at [facility])     Status: Abnormal   Collection Time: 12/19/21  2:02 PM  Result Value Ref Range   pH, Ven 7.37 7.25 - 7.43   pCO2, Ven 59 44 - 60 mmHg   pO2, Ven <31 (LL) 32 - 45 mmHg   Bicarbonate 34.5 (H) 20.0 - 28.0 mmol/L   Acid-Base Excess 7.0 (H) 0.0 - 2.0 mmol/L   O2 Saturation 17.1 %   Patient temperature 36.4    Collection site LEFT ANTECUBITAL    Drawn by [id number]   Lactic acid, plasma     Status: None   Collection Time: 12/19/21  2:02  PM  Result Value Ref Range   Lactic Acid, Venous 1.1 0.5 - 1.9 mmol/L  Troponin I (High Sensitivity)     Status: None   Collection Time: 12/19/21  2:02 PM  Result Value Ref Range   Troponin I (High Sensitivity) 6 <18 ng/L  Blood culture (routine x 2)     Status: None (Preliminary result)   Collection Time: 12/19/21  2:15 PM   Specimen: BLOOD  Result Value Ref  Range   Specimen Description BLOOD    Special Requests NONE BLOOD RIGHT ARM    Culture      NO GROWTH <12 HOURS Performed at [facility], [address] [place], [place] [address]    Report Status PENDING   Blood culture (routine x 2)     Status: None (Preliminary result)   Collection Time: 12/19/21  2:20 PM   Specimen: BLOOD  Result Value Ref Range   Specimen Description BLOOD    Special Requests NONE BLOOD LEFT ARM    Culture      NO GROWTH <12 HOURS Performed at [facility], [address] [place], [place] [address]    Report Status PENDING    DG Chest Portable 1 View  Result Date: 12/19/2021 CLINICAL DATA:  Hypoxia, COPD EXAM: PORTABLE CHEST 1 VIEW COMPARISON:  06/11/2020 FINDINGS: Lungs are hyperinflated as can be seen with COPD. Right upper lobe airspace disease concerning for pneumonia. No pleural effusion or pneumothorax. Heart and mediastinal contours are unremarkable. No acute osseous abnormality. IMPRESSION: 1. Right upper lobe airspace disease concerning for pneumonia. Electronically Signed   By: Kathreen Devoid M.D.   On: 12/19/2021 14:23     Assessment and Plan: No notes have been filed under this hospital service. Service: Hospitalist  Principal Problem:   CAP (community acquired pneumonia) Active Problems:   Essential hypertension   Hypertension   Coronary artery disease   COPD   Failure to thrive in adult   AKI (acute kidney injury) ([place])   Hypokalemia   Community-acquired pneumonia with COPD exacerbation Admit to stepdown Blood pressures are little soft due to failure to thrive and probable dehydration/acute kidney injury Hold blood pressure medications Antibiotics: Rocephin 2 g every 24 and azithromycin Robitussin Blood cultures drawn in the emergency department Sputum cultures CBC tomorrow Strep and Legionella antigen by urine COVID screen Failure to thrive Encourage diet Ensure between meals Acute kidney injury IVF Recheck Cr in  AM Hypokalemia Oral replacement Coronary artery disease Continue BB Hypertension Hold ARB and diuretic.    Advance Care Planning:   Code Status: Prior Full Code  Consults: none  Family Communication: wife present  Severity of Illness: The appropriate patient status for this patient is INPATIENT. Inpatient status is judged to be reasonable and necessary in order to provide the required intensity of service to ensure the patient's safety. The patient's presenting symptoms, physical exam findings, and initial radiographic and laboratory data in the context of their chronic comorbidities is felt to place them at high risk for further clinical deterioration. Furthermore, it is not anticipated that the patient will be medically stable for discharge from the hospital within 2 midnights of admission.   * I certify that at the point of admission it is my clinical judgment that the patient will require inpatient hospital care spanning beyond 2 midnights from the point of admission due to high intensity of service, high risk for further deterioration and high frequency of surveillance required.*  Author: Truett Mainland, DO 12/19/2021 4:15  PM  For on call review [email or web address].

## 2021-12-20 DIAGNOSIS — I2510 Atherosclerotic heart disease of native coronary artery without angina pectoris: Secondary | ICD-10-CM | POA: Diagnosis not present

## 2021-12-20 DIAGNOSIS — R627 Adult failure to thrive: Secondary | ICD-10-CM | POA: Diagnosis not present

## 2021-12-20 DIAGNOSIS — J189 Pneumonia, unspecified organism: Secondary | ICD-10-CM | POA: Diagnosis not present

## 2021-12-20 DIAGNOSIS — I10 Essential (primary) hypertension: Secondary | ICD-10-CM | POA: Diagnosis not present

## 2021-12-20 LAB — CBC
HCT: 39.1 % (ref 39.0–52.0)
Hemoglobin: 12.3 g/dL — ABNORMAL LOW (ref 13.0–17.0)
MCH: 29.6 pg (ref 26.0–34.0)
MCHC: 31.5 g/dL (ref 30.0–36.0)
MCV: 94.2 fL (ref 80.0–100.0)
Platelets: 144 10*3/uL — ABNORMAL LOW (ref 150–400)
RBC: 4.15 MIL/uL — ABNORMAL LOW (ref 4.22–5.81)
RDW: 14.6 % (ref 11.5–15.5)
WBC: 3.6 10*3/uL — ABNORMAL LOW (ref 4.0–10.5)
nRBC: 0 % (ref 0.0–0.2)

## 2021-12-20 LAB — BASIC METABOLIC PANEL
Anion gap: 8 (ref 5–15)
BUN: 25 mg/dL — ABNORMAL HIGH (ref 8–23)
CO2: 25 mmol/L (ref 22–32)
Calcium: 8.1 mg/dL — ABNORMAL LOW (ref 8.9–10.3)
Chloride: 107 mmol/L (ref 98–111)
Creatinine, Ser: 1.14 mg/dL (ref 0.61–1.24)
GFR, Estimated: >60 mL/min (ref >60)
Glucose, Bld: 188 mg/dL — ABNORMAL HIGH (ref 70–99)
Potassium: 3.4 mmol/L — ABNORMAL LOW (ref 3.5–5.1)
Sodium: 140 mmol/L (ref 135–145)

## 2021-12-20 LAB — PROCALCITONIN: Procalcitonin: <0.1 ng/mL

## 2021-12-20 LAB — STREP PNEUMONIAE URINARY ANTIGEN: Strep Pneumo Urinary Antigen: NEGATIVE

## 2021-12-20 MED ORDER — HYDROCOD POLI-CHLORPHE POLI ER 10-8 MG/5ML PO SUER
5.0000 mL | Freq: Two times a day (BID) | ORAL | Status: DC | PRN
  Administered 2021-12-21: 5 mL via ORAL
  Filled 2021-12-20: qty 5

## 2021-12-20 MED ORDER — GUAIFENESIN-DM 100-10 MG/5ML PO SYRP: 10.0000 mL | ORAL_SOLUTION | ORAL | Status: DC | PRN

## 2021-12-20 MED ORDER — METHYLPREDNISOLONE SODIUM SUCC 40 MG IJ SOLR
40.0000 mg | Freq: Two times a day (BID) | INTRAMUSCULAR | Status: DC
  Administered 2021-12-20 – 2021-12-21 (×3): 40 mg via INTRAVENOUS
  Filled 2021-12-20 (×3): qty 1

## 2021-12-20 MED ORDER — VITAMIN C 500 MG PO TABS
500.0000 mg | ORAL_TABLET | Freq: Every day | ORAL | Status: DC
  Administered 2021-12-20 – 2021-12-21 (×2): 500 mg via ORAL
  Filled 2021-12-20 (×2): qty 1

## 2021-12-20 MED ORDER — NIRMATRELVIR/RITONAVIR (PAXLOVID) TABLET (RENAL DOSING)
2.0000 | ORAL_TABLET | Freq: Two times a day (BID) | ORAL | Status: DC
  Administered 2021-12-20 – 2021-12-21 (×3): 2 via ORAL
  Filled 2021-12-20: qty 20

## 2021-12-20 MED ORDER — ADULT MULTIVITAMIN W/MINERALS CH
1.0000 | ORAL_TABLET | Freq: Every day | ORAL | Status: DC
  Administered 2021-12-20 – 2021-12-21 (×2): 1 via ORAL
  Filled 2021-12-20 (×2): qty 1

## 2021-12-20 MED ORDER — IPRATROPIUM-ALBUTEROL 20-100 MCG/ACT IN AERS
1.0000 | INHALATION_SPRAY | Freq: Four times a day (QID) | RESPIRATORY_TRACT | Status: DC
  Administered 2021-12-20 – 2021-12-21 (×3): 1 via RESPIRATORY_TRACT
  Filled 2021-12-20: qty 4

## 2021-12-20 MED ORDER — ZINC SULFATE 220 (50 ZN) MG PO CAPS
220.0000 mg | ORAL_CAPSULE | Freq: Every day | ORAL | Status: DC
  Administered 2021-12-20 – 2021-12-21 (×2): 220 mg via ORAL
  Filled 2021-12-20 (×2): qty 1

## 2021-12-20 MED ORDER — ALBUTEROL SULFATE HFA 108 (90 BASE) MCG/ACT IN AERS: 2.0000 | INHALATION_SPRAY | RESPIRATORY_TRACT | Status: DC | PRN

## 2021-12-20 MED ORDER — NIRMATRELVIR/RITONAVIR (PAXLOVID)TABLET: 3.0000 | ORAL_TABLET | Freq: Two times a day (BID) | ORAL | Status: DC

## 2021-12-20 NOTE — TOC Progression Note (Signed)
  Transition of Care [facility]) Screening Note   Patient Details  Name: DEONDRE MARINARO Date of Birth: 1945-08-23   Transition of Care [facility]) CM/SW Contact:    Boneta Lucks, RN Phone Number: 12/20/2021, 9:24 AM    Transition of Care Department [facility]) has reviewed patient and no TOC needs have been identified at this time. We will continue to monitor patient advancement through interdisciplinary progression rounds. If new patient transition needs arise, please place a TOC consult.         Living arrangements for the past 2 months: [place]

## 2021-12-20 NOTE — Progress Notes (Signed)
Patient resting quietly in bed with eyes closed good appetite for breakfast ate 75%, ate 50% for lunch, offered to transfer from bed to chair patient refused. Family currently at bedside.

## 2021-12-20 NOTE — Progress Notes (Signed)
PROGRESS NOTE     Thomas Mosley, is a 76 y.o. male, DOB - 1945/08/17, ZHG:[id number]  Admit date - 12/19/2021   Admitting Physician Truett Mainland, DO  Outpatient Primary MD for the patient is Celene Squibb, MD  LOS - 1 Cc---shob       Brief Narrative:  76 y.o. male with medical history significant of COPD, coronary artery disease with history of NSTEMI with multiple stents and normal EF, hypertension, hyperlipidemia admitted on 12/19/2021 with acute hypoxic respiratory failure secondary to COVID with 19 infection   -Assessment and Plan: 1)Acute hypoxic respiratory failure secondary to COVID-19 infection/Pneumonia--- The treatment plan and use of medications  for treatment of COVID-19 infection and possible side effects were discussed with patient and his wife -----Patient and wife verbalizes understanding and agrees to treatment protocols   --Patient is positive for COVID-19 infection, chest x-ray with findings of infiltrates/opacities,  patient is tachypneic/hypoxic and requiring continuous supplemental oxygen---patient meets criteria for initiation of Paxlovid AND Steroid therapy per protocol  --Check and trend inflammatory markers including D-dimer, ferritin and  CRP---also follow CBC and CMP --Supplemental oxygen to keep O2 sats above 93% ---- Encourage prone positioning  if able to tolerate --Attempt to maintain euvolemic state --Zinc and vitamin C as ordered -Albuterol inhaler as needed -Accu-Cheks/fingersticks while on high-dose steroids -Hypoxia improving we will try to wean off oxygen  2)AKI----acute kidney injury  - creatinine on admission= 1.56  ,  baseline creatinine = 0.9 to 1.0   , - creatinine is now= 1.14  ,  -Continue IV fluids -renally adjust medications, avoid nephrotoxic agents / dehydration  / hypotension  3)HYpokalemia--replace and recheck  4)HTN/CAD--- no ACS type symptoms, continue aspirin, Plavix and metoprolol  Disposition/Need for in-Hospital Stay-  patient unable to be discharged at this time due to -hypoxic respiratory failure due to COVID-19 infection -AKI requiring IV fluids  Status is: Inpatient   Disposition: The patient is from: Home              Anticipated d/c is to: Home              Anticipated d/c date is: 2 days              Patient currently is not medically stable to d/c. Barriers: Not Clinically Stable-   Code Status :  -  Code Status: Full Code   Family Communication:   (patient is alert, awake and coherent)  Discussed with wife at bedside  DVT Prophylaxis  :   - SCDs  enoxaparin (LOVENOX) injection 40 mg Start: 12/19/21 1645   Lab Results  Component Value Date   PLT 144 (L) 12/20/2021    Inpatient Medications  Scheduled Meds:  vitamin C  500 mg Oral Daily   aspirin EC  81 mg Oral Daily   Chlorhexidine Gluconate Cloth  6 each Topical Q0600   clopidogrel  75 mg Oral Daily   enoxaparin (LOVENOX) injection  40 mg Subcutaneous Q24H   feeding supplement  237 mL Oral BID BM   fluticasone furoate-vilanterol  1 puff Inhalation Daily   And   umeclidinium bromide  1 puff Inhalation Daily   Ipratropium-Albuterol  1 puff Inhalation Q6H   loratadine  10 mg Oral Daily   methylPREDNISolone (SOLU-MEDROL) injection  40 mg Intravenous Q12H   metoprolol succinate  100 mg Oral Daily   multivitamin with minerals  1 tablet Oral Daily   nirmatrelvir/ritonavir EUA (renal dosing)  2 tablet Oral BID  pantoprazole  40 mg Oral Daily   potassium chloride  40 mEq Oral BID   zinc sulfate  220 mg Oral Daily   Continuous Infusions:  sodium chloride 50 mL/hr at 12/20/21 1007   azithromycin 500 mg (12/20/21 1456)   cefTRIAXone (ROCEPHIN)  IV Stopped (12/19/21 2103)   PRN Meds:.acetaminophen **OR** acetaminophen, albuterol, chlorpheniramine-HYDROcodone, guaiFENesin-dextromethorphan, ondansetron **OR** ondansetron (ZOFRAN) IV   Anti-infectives (From admission, onward)    Start     Dose/Rate Route Frequency Ordered Stop    12/20/21 1000  nirmatrelvir/ritonavir EUA (PAXLOVID) 3 tablet  Status:  Discontinued        3 tablet Oral 2 times daily 12/20/21 0830 12/20/21 0838   12/20/21 1000  nirmatrelvir/ritonavir EUA (renal dosing) (PAXLOVID) 2 tablet        2 tablet Oral 2 times daily 12/20/21 0838 12/25/21 0959   12/20/21 0000  azithromycin (ZITHROMAX) 500 mg in sodium chloride 0.9 % 250 mL IVPB  Status:  Discontinued        500 mg 250 mL/hr over 60 Minutes Intravenous Every 24 hours 12/19/21 1642 12/19/21 1647   12/19/21 2000  cefTRIAXone (ROCEPHIN) 2 g in sodium chloride 0.9 % 100 mL IVPB        2 g 200 mL/hr over 30 Minutes Intravenous Every 24 hours 12/19/21 1642 12/24/21 1959   12/19/21 1445  azithromycin (ZITHROMAX) 500 mg in sodium chloride 0.9 % 250 mL IVPB        500 mg 250 mL/hr over 60 Minutes Intravenous Every 24 hours 12/19/21 1439 12/24/21 1444   12/19/21 1445  ceFEPIme (MAXIPIME) 2 g in sodium chloride 0.9 % 100 mL IVPB  Status:  Discontinued        2 g 200 mL/hr over 30 Minutes Intravenous Every 12 hours 12/19/21 1439 12/19/21 1648         Subjective: Thomas Mosley today has no fevers, no emesis,  No chest pain,   -Wife and grandkids at bedside -Cough and shortness of breath is not worse -Appetite improving   Objective: Vitals:   12/20/21 1627 12/20/21 1700 12/20/21 1800 12/20/21 1900  BP:  (!) 131/52 (!) 125/54 (!) 113/55  Pulse: 71 73 72   Resp:      Temp: (!) 97.5 F (36.4 C)     TempSrc: Axillary     SpO2: 100% 100% 97%   Weight:      Height:        Intake/Output Summary (Last 24 hours) at 12/20/2021 1943 Last data filed at 12/20/2021 1800 Gross per 24 hour  Intake 1888.73 ml  Output 250 ml  Net 1638.73 ml   Filed Weights   12/19/21 1135  Weight: 56.7 kg    Physical Exam  Gen:- Awake Alert, conversational dyspnea HEENT:- [place].AT, No sclera icterus] Nose- [place] 3 L./min Ears- HOH dementia Neck-Supple Neck,No JVD,.  Lungs-breath sounds with scattered rhonchi and wheezes  bilaterally CV- S1, S2 normal, regular  Abd-  +ve B.Sounds, Abd Soft, No tenderness,    Extremity/Skin:- No  edema, pedal pulses present  Psych-affect is appropriate, oriented x3 Neuro-generalized weakness no new focal deficits, no tremors  Data Reviewed: I have personally reviewed following labs and imaging studies  CBC: Recent Labs  Lab 12/19/21 1348 12/20/21 0348  WBC 8.3 3.6*  NEUTROABS 6.3  --   HGB 13.4 12.3*  HCT 40.5 39.1  MCV 91.2 94.2  PLT 182 782*   Basic Metabolic Panel: Recent Labs  Lab 12/19/21 1348 12/20/21 0348  NA 138  140  K 2.8* 3.4*  CL 98 107  CO2 29 25  GLUCOSE 111* 188*  BUN 28* 25*  CREATININE 1.56* 1.14  CALCIUM 8.3* 8.1*   GFR: Estimated Creatinine Clearance: 43.5 mL/min (by C-G formula based on SCr of 1.14 mg/dL). Liver Function Tests: Recent Labs  Lab 12/19/21 1348  AST 30  ALT 32  ALKPHOS 84  BILITOT 0.7  PROT 7.5  ALBUMIN 3.0*   Cardiac Enzymes: No results for input(s): "CKTOTAL", "CKMB", "CKMBINDEX", "TROPONINI" in the last 168 hours. BNP (last 3 results) No results for input(s): "PROBNP" in the last 8760 hours. HbA1C: No results for input(s): "HGBA1C" in the last 72 hours. Sepsis Labs: '@LABRCNTIP'$ (procalcitonin:4,lacticidven:4) ) Recent Results (from the past 240 hour(s))  Blood culture (routine x 2)     Status: None (Preliminary result)   Collection Time: 12/19/21  2:15 PM   Specimen: Left Antecubital; Blood  Result Value Ref Range Status   Specimen Description   Final    LEFT ANTECUBITAL BOTTLES DRAWN AEROBIC AND ANAEROBIC   Special Requests Blood Culture adequate volume  Final   Culture   Final    NO GROWTH < 12 HOURS Performed at [facility], [address], [place], [place] [address]    Report Status PENDING  Incomplete  Blood culture (routine x 2)     Status: None (Preliminary result)   Collection Time: 12/19/21  2:20 PM   Specimen: Right Antecubital; Blood  Result Value Ref Range Status   Specimen  Description   Final    RIGHT ANTECUBITAL BOTTLES DRAWN AEROBIC AND ANAEROBIC   Special Requests   Final    Blood Culture results may not be optimal due to an excessive volume of blood received in culture bottles   Culture   Final    NO GROWTH < 12 HOURS Performed at [facility], [address] [place], [place] [address]    Report Status PENDING  Incomplete  Resp Panel by RT-PCR (Flu A&B, Covid) Nasal Mucosa     Status: Abnormal   Collection Time: 12/19/21  5:30 PM   Specimen: Nasal Mucosa; Nasal Swab  Result Value Ref Range Status   SARS Coronavirus 2 by RT PCR POSITIVE (A) NEGATIVE Final    Comment: (NOTE) SARS-CoV-2 target nucleic acids are DETECTED.  The SARS-CoV-2 RNA is generally detectable in upper respiratory specimens during the acute phase of infection. Positive results are indicative of the presence of the identified virus, but do not rule out bacterial infection or co-infection with other pathogens not detected by the test. Clinical correlation with patient history and other diagnostic information is necessary to determine patient infection status. The expected result is Negative.  Fact Sheet for Patients: EntrepreneurPulse.com.au  Fact Sheet for Healthcare Providers: IncredibleEmployment.be  This test is not yet approved or cleared by the [place] FDA and  has been authorized for detection and/or diagnosis of SARS-CoV-2 by FDA under an Emergency Use Authorization (EUA).  This EUA will remain in effect (meaning this test can be used) for the duration of  the COVID-19 declaration under Section 564(b)(1) of the A ct, 21 U.S.C. section 360bbb-3(b)(1), unless the authorization is terminated or revoked sooner.     Influenza A by PCR NEGATIVE NEGATIVE Final   Influenza B by PCR NEGATIVE NEGATIVE Final    Comment: (NOTE) The Xpert Xpress SARS-CoV-2/FLU/RSV plus assay is intended as an aid in the diagnosis of influenza from  Nasopharyngeal swab specimens and should not be used as a sole basis  for treatment. Nasal washings and aspirates are unacceptable for Xpert Xpress SARS-CoV-2/FLU/RSV testing.  Fact Sheet for Patients: EntrepreneurPulse.com.au  Fact Sheet for Healthcare Providers: IncredibleEmployment.be  This test is not yet approved or cleared by the [place] FDA and has been authorized for detection and/or diagnosis of SARS-CoV-2 by FDA under an Emergency Use Authorization (EUA). This EUA will remain in effect (meaning this test can be used) for the duration of the COVID-19 declaration under Section 564(b)(1) of the Act, 21 U.S.C. section 360bbb-3(b)(1), unless the authorization is terminated or revoked.  Performed at [facility], [address], [place], [place] [address]   MRSA Next Gen by PCR, Nasal     Status: None   Collection Time: 12/19/21  5:30 PM   Specimen: Nasal Mucosa; Nasal Swab  Result Value Ref Range Status   MRSA by PCR Next Gen NOT DETECTED NOT DETECTED Final    Comment: (NOTE) The GeneXpert MRSA Assay (FDA approved for NASAL specimens only), is one component of a comprehensive MRSA colonization surveillance program. It is not intended to diagnose MRSA infection nor to guide or monitor treatment for MRSA infections. Test performance is not FDA approved in patients less than 23 years old. Performed at [facility], [address], [place], [place] [address]      Radiology Studies: DG Chest Portable 1 View  Result Date: 12/19/2021 CLINICAL DATA:  Hypoxia, COPD EXAM: PORTABLE CHEST 1 VIEW COMPARISON:  06/11/2020 FINDINGS: Lungs are hyperinflated as can be seen with COPD. Right upper lobe airspace disease concerning for pneumonia. No pleural effusion or pneumothorax. Heart and mediastinal contours are unremarkable. No acute osseous abnormality. IMPRESSION: 1. Right upper lobe airspace disease concerning for pneumonia. Electronically Signed    By: Kathreen Devoid M.D.   On: 12/19/2021 14:23     Scheduled Meds:  vitamin C  500 mg Oral Daily   aspirin EC  81 mg Oral Daily   Chlorhexidine Gluconate Cloth  6 each Topical Q0600   clopidogrel  75 mg Oral Daily   enoxaparin (LOVENOX) injection  40 mg Subcutaneous Q24H   feeding supplement  237 mL Oral BID BM   fluticasone furoate-vilanterol  1 puff Inhalation Daily   And   umeclidinium bromide  1 puff Inhalation Daily   Ipratropium-Albuterol  1 puff Inhalation Q6H   loratadine  10 mg Oral Daily   methylPREDNISolone (SOLU-MEDROL) injection  40 mg Intravenous Q12H   metoprolol succinate  100 mg Oral Daily   multivitamin with minerals  1 tablet Oral Daily   nirmatrelvir/ritonavir EUA (renal dosing)  2 tablet Oral BID   pantoprazole  40 mg Oral Daily   potassium chloride  40 mEq Oral BID   zinc sulfate  220 mg Oral Daily   Continuous Infusions:  sodium chloride 50 mL/hr at 12/20/21 1007   azithromycin 500 mg (12/20/21 1456)   cefTRIAXone (ROCEPHIN)  IV Stopped (12/19/21 2103)    LOS: 1 day   Roxan Hockey M.D on 12/20/2021 at 7:43 PM  Go to [email or web address] - for contact info  [facility] - Office  [phone]  If 7PM-7AM, please contact night-coverage [email or web address] 12/20/2021, 7:43 PM

## 2021-12-20 NOTE — Plan of Care (Signed)

## 2021-12-21 DIAGNOSIS — I10 Essential (primary) hypertension: Secondary | ICD-10-CM | POA: Diagnosis not present

## 2021-12-21 DIAGNOSIS — R627 Adult failure to thrive: Secondary | ICD-10-CM | POA: Diagnosis not present

## 2021-12-21 DIAGNOSIS — J189 Pneumonia, unspecified organism: Secondary | ICD-10-CM | POA: Diagnosis not present

## 2021-12-21 DIAGNOSIS — J449 Chronic obstructive pulmonary disease, unspecified: Secondary | ICD-10-CM | POA: Diagnosis not present

## 2021-12-21 LAB — CBC WITH DIFFERENTIAL/PLATELET
Abs Immature Granulocytes: 0.05 10*3/uL (ref 0.00–0.07)
Basophils Absolute: 0 10*3/uL (ref 0.0–0.1)
Basophils Relative: 0 %
Eosinophils Absolute: 0 10*3/uL (ref 0.0–0.5)
Eosinophils Relative: 0 %
HCT: 34 % — ABNORMAL LOW (ref 39.0–52.0)
Hemoglobin: 10.9 g/dL — ABNORMAL LOW (ref 13.0–17.0)
Immature Granulocytes: 0 %
Lymphocytes Relative: 2 %
Lymphs Abs: 0.2 10*3/uL — ABNORMAL LOW (ref 0.7–4.0)
MCH: 30 pg (ref 26.0–34.0)
MCHC: 32.1 g/dL (ref 30.0–36.0)
MCV: 93.7 fL (ref 80.0–100.0)
Monocytes Absolute: 0.5 10*3/uL (ref 0.1–1.0)
Monocytes Relative: 4 %
Neutro Abs: 11.6 10*3/uL — ABNORMAL HIGH (ref 1.7–7.7)
Neutrophils Relative %: 94 %
Platelets: 142 10*3/uL — ABNORMAL LOW (ref 150–400)
RBC: 3.63 MIL/uL — ABNORMAL LOW (ref 4.22–5.81)
RDW: 14.6 % (ref 11.5–15.5)
WBC: 12.4 10*3/uL — ABNORMAL HIGH (ref 4.0–10.5)
nRBC: 0 % (ref 0.0–0.2)

## 2021-12-21 LAB — C-REACTIVE PROTEIN: CRP: 7.1 mg/dL — ABNORMAL HIGH (ref <1.0)

## 2021-12-21 LAB — D-DIMER, QUANTITATIVE: D-Dimer, Quant: 2.15 ug/mL-FEU — ABNORMAL HIGH (ref 0.00–0.50)

## 2021-12-21 LAB — COMPREHENSIVE METABOLIC PANEL
ALT: 25 U/L (ref 0–44)
AST: 22 U/L (ref 15–41)
Albumin: 2.3 g/dL — ABNORMAL LOW (ref 3.5–5.0)
Alkaline Phosphatase: 64 U/L (ref 38–126)
Anion gap: 2 — ABNORMAL LOW (ref 5–15)
BUN: 34 mg/dL — ABNORMAL HIGH (ref 8–23)
CO2: 27 mmol/L (ref 22–32)
Calcium: 8.2 mg/dL — ABNORMAL LOW (ref 8.9–10.3)
Chloride: 108 mmol/L (ref 98–111)
Creatinine, Ser: 0.95 mg/dL (ref 0.61–1.24)
GFR, Estimated: >60 mL/min (ref >60)
Glucose, Bld: 127 mg/dL — ABNORMAL HIGH (ref 70–99)
Potassium: 4.8 mmol/L (ref 3.5–5.1)
Sodium: 137 mmol/L (ref 135–145)
Total Bilirubin: 0.4 mg/dL (ref 0.3–1.2)
Total Protein: 5.8 g/dL — ABNORMAL LOW (ref 6.5–8.1)

## 2021-12-21 LAB — PHOSPHORUS: Phosphorus: 2.3 mg/dL — ABNORMAL LOW (ref 2.5–4.6)

## 2021-12-21 LAB — FERRITIN: Ferritin: 188 ng/mL (ref 24–336)

## 2021-12-21 LAB — MAGNESIUM: Magnesium: 1.7 mg/dL (ref 1.7–2.4)

## 2021-12-21 MED ORDER — MAGNESIUM OXIDE -MG SUPPLEMENT 400 (240 MG) MG PO TABS
400.0000 mg | ORAL_TABLET | Freq: Two times a day (BID) | ORAL | Status: DC
  Administered 2021-12-21: 400 mg via ORAL
  Filled 2021-12-21: qty 1

## 2021-12-21 MED ORDER — COMPRESSOR/NEBULIZER MISC: 1.0000 [IU] | 0 refills | Status: AC | PRN

## 2021-12-21 MED ORDER — ADULT MULTIVITAMIN W/MINERALS CH
1.0000 | ORAL_TABLET | Freq: Every day | ORAL | 3 refills | Status: AC
Start: 2021-12-22 — End: ?

## 2021-12-21 MED ORDER — ALBUTEROL SULFATE (2.5 MG/3ML) 0.083% IN NEBU: 2.5000 mg | INHALATION_SOLUTION | RESPIRATORY_TRACT | 2 refills | Status: AC | PRN

## 2021-12-21 MED ORDER — ASCORBIC ACID 500 MG PO TABS: 500.0000 mg | ORAL_TABLET | Freq: Every day | ORAL | 2 refills | Status: AC

## 2021-12-21 MED ORDER — K PHOS MONO-SOD PHOS DI & MONO 155-852-130 MG PO TABS
500.0000 mg | ORAL_TABLET | Freq: Two times a day (BID) | ORAL | Status: DC
Start: 2021-12-21 — End: 2021-12-21
  Administered 2021-12-21: 500 mg via ORAL
  Filled 2021-12-21: qty 2

## 2021-12-21 MED ORDER — ZINC SULFATE 220 (50 ZN) MG PO CAPS: 220.0000 mg | ORAL_CAPSULE | Freq: Every day | ORAL | 1 refills | Status: AC

## 2021-12-21 MED ORDER — NIRMATRELVIR/RITONAVIR (PAXLOVID) TABLET (RENAL DOSING): 2.0000 | ORAL_TABLET | Freq: Two times a day (BID) | ORAL | 0 refills | Status: AC

## 2021-12-21 MED ORDER — ATORVASTATIN CALCIUM 80 MG PO TABS: 80.0000 mg | ORAL_TABLET | Freq: Every day | ORAL | 1 refills | Status: AC

## 2021-12-21 MED ORDER — PREDNISONE 20 MG PO TABS: 40.0000 mg | ORAL_TABLET | Freq: Every day | ORAL | 0 refills | Status: AC

## 2021-12-21 MED ORDER — ASPIRIN 81 MG PO TBEC
81.0000 mg | DELAYED_RELEASE_TABLET | Freq: Every day | ORAL | 12 refills | Status: AC
Start: 2021-12-21 — End: ?

## 2021-12-21 MED ORDER — POTASSIUM CHLORIDE ER 10 MEQ PO TBCR: 10.0000 meq | EXTENDED_RELEASE_TABLET | Freq: Every day | ORAL | 2 refills | Status: AC

## 2021-12-21 NOTE — Discharge Summary (Signed)
Thomas Mosley, is a 76 y.o. male  DOB 1945/05/02  MRN [id number].  Admission date:  12/19/2021  Admitting Physician  Truett Mainland, DO  Discharge Date:  12/21/2021   Primary MD  Celene Squibb, MD  Recommendations for primary care physician for things to follow:   1)Please hold Atorvastatin/Lipitor for the next 4 days while you are taking Paxlovid, may restart Lipitor/atorvastatin after you complete Paxlovid 2)Okay to use albuterol nebulizer treatments as needed for cough,  shortness of breath and wheezing 3)Avoid ibuprofen/Advil/Aleve/Motrin/Goody Powders/Naproxen/BC powders/Meloxicam/Diclofenac/Indomethacin and other Nonsteroidal anti-inflammatory medications as these will make you more likely to bleed and can cause stomach ulcers, can also cause Kidney problems.  4) follow-up with PCP for reevaluation and for possible initiation of antidepressant medication 5)You are strongly advised to isolate/quarantine for at least 10 days from the date of your diagnosis with COVID-19 infection--please always wear a mask if you have to go outside the house  Admission Diagnosis  CAP (community acquired pneumonia) [J18.9]   Discharge Diagnosis  CAP (community acquired pneumonia) [J18.9]  ***  Principal Problem:   CAP (community acquired pneumonia) Active Problems:   Essential hypertension   Hypertension   Coronary artery disease   COPD   Failure to thrive in adult   AKI (acute kidney injury) ([place])   Hypokalemia      Past Medical History:  Diagnosis Date   COPD    not on home o2   Coronary artery disease    NSTEMI 6/17 >> LHC: pLAD 50, pLCx 100 (L-L collats), oRCA 90, RPLB1 60 >> PCI:  atherectomy and DES to RCA // Echo 6/17: EF 55-60, Gr 1 DD, mild AI, mild MR   Hypercholesterolemia    Hypertension    SCC (squamous cell carcinoma) 06/12/2011   left ear rim- tx p bx   SCC (squamous cell carcinoma)  02/20/2015   left inner nose- [id number]   Squamous cell carcinoma of skin 05/14/2020   in situ- right zygomatic area   Stroke ([place]) 06/2020   Tobacco dependence     Past Surgical History:  Procedure Laterality Date   CARDIAC CATHETERIZATION N/A 10/02/2015   Procedure: Left Heart Cath and Coronary Angiography;  Surgeon: Sherren Mocha, MD;  Location: [place] [facility];  Service: Cardiovascular;  Laterality: N/A;   CARDIAC CATHETERIZATION N/A 10/02/2015   Procedure: Intravascular Ultrasound/IVUS;  Surgeon: Sherren Mocha, MD;  Location: [place] [facility];  Service: Cardiovascular;  Laterality: N/A;   CARDIAC CATHETERIZATION N/A 10/03/2015   Procedure: Coronary Stent Intervention Rotablator;  Surgeon: Sherren Mocha, MD;  Location: [place] [facility];  Service: Cardiovascular;  Laterality: N/A;   CATARACT EXTRACTION W/PHACO  02/02/2011   Procedure: CATARACT EXTRACTION PHACO AND INTRAOCULAR LENS PLACEMENT (IOC);  Surgeon: Tonny Branch;  Location: [facility];  Service: Ophthalmology;  Laterality: Left;  CDE 12.33   COLONOSCOPY  02/13/2004   [id number] polyp at 30 cm resected/normal rectum. 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    COLONOSCOPY N/A 11/23/2012   Procedure: COLONOSCOPY;  Surgeon: Daneil Dolin, MD;  Location: [facility];  Service: Endoscopy;  Laterality: N/A;  12:15-rescheduled to 11:00am Darius Bump notified pt       HPI  from the history and physical done on the day of admission:     ***  ****     Hospital Course:     No notes on file  ***** Assessment and Plan: No notes have been filed under this hospital service. Service: Hospitalist       Discharge Condition: ***  Follow UP     Consults obtained - ***  Diet and Activity recommendation:  As advised  Discharge Instructions    **** Discharge Instructions     Call MD for:  persistant dizziness or  light-headedness   Complete by: As directed    Call MD for:  persistant nausea and vomiting   Complete by: As directed    Call MD for:  severe uncontrolled pain   Complete by: As directed    Call MD for:  temperature >100.4   Complete by: As directed    Diet - low sodium heart healthy   Complete by: As directed    Discharge instructions   Complete by: As directed    1)Please hold Atorvastatin/Lipitor for the next 4 days while you are taking Paxlovid, may restart Lipitor/atorvastatin after you complete Paxlovid 2)Okay to use albuterol nebulizer treatments as needed for cough,  shortness of breath and wheezing 3)Avoid ibuprofen/Advil/Aleve/Motrin/Goody Powders/Naproxen/BC powders/Meloxicam/Diclofenac/Indomethacin and other Nonsteroidal anti-inflammatory medications as these will make you more likely to bleed and can cause stomach ulcers, can also cause Kidney problems.  4) follow-up with PCP for reevaluation and for possible initiation of antidepressant medication 5)You are strongly advised to isolate/quarantine for at least 10 days from the date of your diagnosis with COVID-19 infection--please always wear a mask if you have to go outside the house   For home use only DME Nebulizer machine   Complete by: As directed    Patient needs a nebulizer to treat with the following condition: COPD (chronic obstructive pulmonary disease) ([place])   Length of Need: Lifetime   For home use only DME Nebulizer/meds   Complete by: As directed    Patient needs a nebulizer to treat with the following condition: COPD (chronic obstructive pulmonary disease) ([place])   Length of Need: Lifetime   Increase activity slowly   Complete by: As directed          Discharge Medications     Allergies as of 12/21/2021   No Known Allergies      Medication List     STOP taking these medications    BC HEADACHE POWDER PO   loratadine 10 MG tablet Commonly known as: CLARITIN       TAKE these medications     albuterol 108 (90 Base) MCG/ACT inhaler Commonly known as: VENTOLIN HFA Inhale 2 puffs into the lungs every 6 (six) hours as needed for wheezing or shortness of breath. What changed: Another medication with the same name was added. Make sure you understand how and when to take each.   albuterol (2.5 MG/3ML) 0.083% nebulizer solution Commonly known as: PROVENTIL Take 3 mLs (2.5 mg total) by nebulization every 4 (four) hours as needed for wheezing or shortness of breath. What changed: You were already taking a medication with the same name, and this prescription was added. Make sure you understand how and when to  take each.   ascorbic acid 500 MG tablet Commonly known as: VITAMIN C Take 1 tablet (500 mg total) by mouth daily. Start taking on: December 22, 2021   aspirin EC 81 MG tablet Take 1 tablet (81 mg total) by mouth daily with breakfast. What changed: when to take this   atorvastatin 80 MG tablet Commonly known as: LIPITOR Take 1 tablet (80 mg total) by mouth daily. Start taking on: December 25, 2021 What changed: These instructions start on December 25, 2021. If you are unsure what to do until then, ask your doctor or other care provider.   clopidogrel 75 MG tablet Commonly known as: PLAVIX Take 75 mg by mouth daily.   Compressor/Nebulizer Misc 1 Units by Does not apply route as needed. For COPD   hydrochlorothiazide 25 MG tablet Commonly known as: HYDRODIURIL TAKE 1/2 TABLET BY MOUTH EVERY DAY   losartan 100 MG tablet Commonly known as: COZAAR Take 1 tablet (100 mg total) by mouth daily. Please make overdue appt with Dr. Burt Knack before anymore refills. Thank you 1st attempt   metoprolol succinate 100 MG 24 hr tablet Commonly known as: TOPROL-XL TAKE 1 TABLET (100 MG TOTAL) BY MOUTH DAILY. Take with or immediately following a meal. Please call and make appt with Dr. Burt Knack for future refills [phone]. Thank you. 1st attempt   multivitamin with minerals  Tabs tablet Take 1 tablet by mouth daily. Start taking on: December 22, 2021   nirmatrelvir/ritonavir EUA (renal dosing) 10 x 150 MG & 10 x '100MG'$  Tabs Commonly known as: PAXLOVID Take 2 tablets by mouth 2 (two) times daily for 5 days. Take nirmatrelvir (150 mg) one tablet twice daily for 5 days and ritonavir (100 mg) one tablet twice daily for 5 days.   nitroGLYCERIN 0.4 MG SL tablet Commonly known as: NITROSTAT Place 1 tablet (0.4 mg total) under the tongue every 5 (five) minutes x 3 doses as needed for chest pain.   pantoprazole 40 MG tablet Commonly known as: PROTONIX Take 40 mg by mouth daily.   potassium chloride 10 MEQ tablet Commonly known as: KLOR-CON Take 1 tablet (10 mEq total) by mouth daily. Take While taking hydrochlorothiazide/HCTZ   predniSONE 20 MG tablet Commonly known as: DELTASONE Take 2 tablets (40 mg total) by mouth daily with breakfast for 5 days.   Trelegy Ellipta 200-62.5-25 MCG/ACT Aepb Generic drug: Fluticasone-Umeclidin-Vilant Inhale 1 puff into the lungs daily.   umeclidinium-vilanterol 62.5-25 MCG/INH Aepb Commonly known as: ANORO ELLIPTA Inhale 1 puff into the lungs daily.   zinc sulfate 220 (50 Zn) MG capsule Take 1 capsule (220 mg total) by mouth daily. Start taking on: December 22, 2021               Durable Medical Equipment  (From admission, onward)           Start     Ordered   12/21/21 0000  For home use only DME Nebulizer machine       Question Answer Comment  Patient needs a nebulizer to treat with the following condition COPD (chronic obstructive pulmonary disease) ([place])   Length of Need Lifetime      12/21/21 1117   12/21/21 0000  For home use only DME Nebulizer/meds       Question Answer Comment  Patient needs a nebulizer to treat with the following condition COPD (chronic obstructive pulmonary disease) ([place])   Length of Need Lifetime      12/21/21 1117  Major procedures and Radiology Reports  - PLEASE review detailed and final reports for all details, in brief -   ***  DG Chest Portable 1 View  Result Date: 12/19/2021 CLINICAL DATA:  Hypoxia, COPD EXAM: PORTABLE CHEST 1 VIEW COMPARISON:  06/11/2020 FINDINGS: Lungs are hyperinflated as can be seen with COPD. Right upper lobe airspace disease concerning for pneumonia. No pleural effusion or pneumothorax. Heart and mediastinal contours are unremarkable. No acute osseous abnormality. IMPRESSION: 1. Right upper lobe airspace disease concerning for pneumonia. Electronically Signed   By: Kathreen Devoid M.D.   On: 12/19/2021 14:23    Micro Results   *** Recent Results (from the past 240 hour(s))  Blood culture (routine x 2)     Status: None (Preliminary result)   Collection Time: 12/19/21  2:15 PM   Specimen: Left Antecubital; Blood  Result Value Ref Range Status   Specimen Description   Final    LEFT ANTECUBITAL BOTTLES DRAWN AEROBIC AND ANAEROBIC   Special Requests Blood Culture adequate volume  Final   Culture   Final    NO GROWTH 2 DAYS Performed at [facility], [address] [place], [place] [address]    Report Status PENDING  Incomplete  Blood culture (routine x 2)     Status: None (Preliminary result)   Collection Time: 12/19/21  2:20 PM   Specimen: Right Antecubital; Blood  Result Value Ref Range Status   Specimen Description   Final    RIGHT ANTECUBITAL BOTTLES DRAWN AEROBIC AND ANAEROBIC   Special Requests   Final    Blood Culture results may not be optimal due to an excessive volume of blood received in culture bottles   Culture   Final    NO GROWTH 2 DAYS Performed at [facility], [address], [place], [place] [address]    Report Status PENDING  Incomplete  Resp Panel by RT-PCR (Flu A&B, Covid) Nasal Mucosa     Status: Abnormal   Collection Time: 12/19/21  5:30 PM   Specimen: Nasal Mucosa; Nasal Swab  Result Value Ref Range Status   SARS Coronavirus 2 by RT PCR POSITIVE (A) NEGATIVE Final    Comment:  (NOTE) SARS-CoV-2 target nucleic acids are DETECTED.  The SARS-CoV-2 RNA is generally detectable in upper respiratory specimens during the acute phase of infection. Positive results are indicative of the presence of the identified virus, but do not rule out bacterial infection or co-infection with other pathogens not detected by the test. Clinical correlation with patient history and other diagnostic information is necessary to determine patient infection status. The expected result is Negative.  Fact Sheet for Patients: EntrepreneurPulse.com.au  Fact Sheet for Healthcare Providers: IncredibleEmployment.be  This test is not yet approved or cleared by the [place] FDA and  has been authorized for detection and/or diagnosis of SARS-CoV-2 by FDA under an Emergency Use Authorization (EUA).  This EUA will remain in effect (meaning this test can be used) for the duration of  the COVID-19 declaration under Section 564(b)(1) of the A ct, 21 U.S.C. section 360bbb-3(b)(1), unless the authorization is terminated or revoked sooner.     Influenza A by PCR NEGATIVE NEGATIVE Final   Influenza B by PCR NEGATIVE NEGATIVE Final    Comment: (NOTE) The Xpert Xpress SARS-CoV-2/FLU/RSV plus assay is intended as an aid in the diagnosis of influenza from Nasopharyngeal swab specimens and should not be used as a sole basis for treatment. Nasal washings and aspirates are unacceptable for Xpert Xpress SARS-CoV-2/FLU/RSV  testing.  Fact Sheet for Patients: EntrepreneurPulse.com.au  Fact Sheet for Healthcare Providers: IncredibleEmployment.be  This test is not yet approved or cleared by the [place] FDA and has been authorized for detection and/or diagnosis of SARS-CoV-2 by FDA under an Emergency Use Authorization (EUA). This EUA will remain in effect (meaning this test can be used) for the duration of the COVID-19  declaration under Section 564(b)(1) of the Act, 21 U.S.C. section 360bbb-3(b)(1), unless the authorization is terminated or revoked.  Performed at [facility], [address], [place], [place] [address]   MRSA Next Gen by PCR, Nasal     Status: None   Collection Time: 12/19/21  5:30 PM   Specimen: Nasal Mucosa; Nasal Swab  Result Value Ref Range Status   MRSA by PCR Next Gen NOT DETECTED NOT DETECTED Final    Comment: (NOTE) The GeneXpert MRSA Assay (FDA approved for NASAL specimens only), is one component of a comprehensive MRSA colonization surveillance program. It is not intended to diagnose MRSA infection nor to guide or monitor treatment for MRSA infections. Test performance is not FDA approved in patients less than 62 years old. Performed at [facility], [address] [place], [place] [address]     Today   Subjective    Knox Cervi today has no ***          Patient has been seen and examined prior to discharge   Objective   Blood pressure (!) 148/50, pulse (!) 48, temperature 97.7 F (36.5 C), temperature source Axillary, resp. rate 18, height '6\' 2"'$  (1.88 m), weight 63.1 kg, SpO2 100 %.   Intake/Output Summary (Last 24 hours) at 12/21/2021 1119 Last data filed at 12/21/2021 0800 Gross per 24 hour  Intake 1006.53 ml  Output 600 ml  Net 406.53 ml    Exam Gen:- Awake Alert, no acute distress *** HEENT:- [place].AT, No sclera icterus Neck-Supple Neck,No JVD,.  Lungs-  CTAB , good air movement bilaterally CV- S1, S2 normal, regular Abd-  +ve B.Sounds, Abd Soft, No tenderness,    Extremity/Skin:- No  edema,   good pulses Psych-affect is appropriate, oriented x3 Neuro-no new focal deficits, no tremors ***   Data Review   CBC w Diff:  Lab Results  Component Value Date   WBC 12.4 (H) 12/21/2021   HGB 10.9 (L) 12/21/2021   HCT 34.0 (L) 12/21/2021   PLT 142 (L) 12/21/2021   LYMPHOPCT 2 12/21/2021   MONOPCT 4 12/21/2021   EOSPCT 0 12/21/2021   BASOPCT 0  12/21/2021    CMP:  Lab Results  Component Value Date   NA 137 12/21/2021   NA 138 01/30/2020   K 4.8 12/21/2021   CL 108 12/21/2021   CO2 27 12/21/2021   BUN 34 (H) 12/21/2021   BUN 24 01/30/2020   CREATININE 0.95 12/21/2021   GLU 105 03/03/2019   PROT 5.8 (L) 12/21/2021   ALBUMIN 2.3 (L) 12/21/2021   BILITOT 0.4 12/21/2021   ALKPHOS 64 12/21/2021   AST 22 12/21/2021   ALT 25 12/21/2021  .  Total Discharge time is about 33 minutes  Roxan Hockey M.D on 12/21/2021 at 11:19 AM  Go to [email or web address] -  for contact info  [facility] - Office  [phone]

## 2021-12-21 NOTE — Progress Notes (Signed)
Being discharged to home. IV access removed and patient information with where to pickup medications. Discussed how to take medications sent home from here and demonstrated how to give combivent to husband. Stated she did not have any questions. Having to wait for delivery of nebulizer machine to patients room before discharge.

## 2021-12-21 NOTE — Plan of Care (Signed)
  Problem: Education: Goal: Knowledge of disease or condition will improve Outcome: Progressing Goal: Knowledge of the prescribed therapeutic regimen will improve Outcome: Progressing Goal: Individualized Educational Video(s) Outcome: Progressing   Problem: Activity: Goal: Ability to tolerate increased activity will improve Outcome: Progressing Goal: Will verbalize the importance of balancing activity with adequate rest periods Outcome: Progressing   Problem: Respiratory: Goal: Ability to maintain a clear airway will improve Outcome: Progressing Goal: Levels of oxygenation will improve Outcome: Progressing Goal: Ability to maintain adequate ventilation will improve Outcome: Progressing   Problem: Activity: Goal: Ability to tolerate increased activity will improve Outcome: Progressing   Problem: Clinical Measurements: Goal: Ability to maintain a body temperature in the normal range will improve Outcome: Progressing   Problem: Respiratory: Goal: Ability to maintain adequate ventilation will improve Outcome: Progressing Goal: Ability to maintain a clear airway will improve Outcome: Progressing   Problem: Education: Goal: Knowledge of General Education information will improve Description: Including pain rating scale, medication(s)/side effects and non-pharmacologic comfort measures Outcome: Progressing   Problem: Health Behavior/Discharge Planning: Goal: Ability to manage health-related needs will improve Outcome: Progressing   Problem: Clinical Measurements: Goal: Ability to maintain clinical measurements within normal limits will improve Outcome: Progressing Goal: Will remain free from infection Outcome: Progressing Goal: Diagnostic test results will improve Outcome: Progressing Goal: Respiratory complications will improve Outcome: Progressing Goal: Cardiovascular complication will be avoided Outcome: Progressing   Problem: Activity: Goal: Risk for activity  intolerance will decrease Outcome: Progressing   Problem: Nutrition: Goal: Adequate nutrition will be maintained Outcome: Progressing   Problem: Coping: Goal: Level of anxiety will decrease Outcome: Progressing   Problem: Elimination: Goal: Will not experience complications related to bowel motility Outcome: Progressing Goal: Will not experience complications related to urinary retention Outcome: Progressing   Problem: Pain Managment: Goal: General experience of comfort will improve Outcome: Progressing   Problem: Safety: Goal: Ability to remain free from injury will improve Outcome: Progressing   Problem: Skin Integrity: Goal: Risk for impaired skin integrity will decrease Outcome: Progressing   Problem: Education: Goal: Knowledge of risk factors and measures for prevention of condition will improve Outcome: Progressing   Problem: Coping: Goal: Psychosocial and spiritual needs will be supported Outcome: Progressing   Problem: Respiratory: Goal: Will maintain a patent airway Outcome: Progressing Goal: Complications related to the disease process, condition or treatment will be avoided or minimized Outcome: Progressing

## 2021-12-21 NOTE — TOC Transition Note (Signed)
Transition of Care [facility]) - CM/SW Discharge Note   Patient Details  Name: Thomas Mosley MRN: [id number] Date of Birth: 05/27/1945  [facility] CM/SW Contact:  Verdell Carmine, RN Phone Number: 12/21/2021, 11:45 AM   Clinical Narrative:      Provider messaged TOC about patient needing a nebulizer . Patient is discharging today. [facility] called and RN added to chat  so that patient would not go home without nebulizer. They will be delivering to bedside   Barriers to Discharge: Continued Medical Work up   Patient Goals and CMS Choice        Discharge Placement                       Discharge Plan and Services                DME Arranged: Nebulizer machine DME Agency: [employer] Date DME Agency Contacted: 12/21/21 Time DME Agency Contacted: [phone] Representative spoke with at DME Agency: Delana Meyer            Social Determinants of Health ([place]) Interventions     Readmission Risk Interventions     No data to display

## 2021-12-22 LAB — LEGIONELLA PNEUMOPHILA SEROGP 1 UR AG: L. pneumophila Serogp 1 Ur Ag: NEGATIVE

## 2021-12-24 LAB — CULTURE, BLOOD (ROUTINE X 2)
Culture: NO GROWTH
Culture: NO GROWTH
Special Requests: ADEQUATE

## 2022-01-22 ENCOUNTER — Other Ambulatory Visit: Payer: Self-pay | Admitting: Physician Assistant

## 2022-02-17 ENCOUNTER — Other Ambulatory Visit: Payer: Self-pay | Admitting: Physician Assistant

## 2022-02-21 ENCOUNTER — Other Ambulatory Visit: Payer: Self-pay | Admitting: Physician Assistant

## 2022-08-12 DEATH — deceased
